# Patient Record
Sex: Female | Born: 1935 | Race: White | Hispanic: No | Smoking: Former smoker
Health system: Southern US, Community
[De-identification: ages and names within clinical notes are randomized; demographics above are authoritative.]

## PROBLEM LIST (undated history)

## (undated) DIAGNOSIS — M47812 Spondylosis without myelopathy or radiculopathy, cervical region: Secondary | ICD-10-CM

## (undated) DIAGNOSIS — I872 Venous insufficiency (chronic) (peripheral): Secondary | ICD-10-CM

## (undated) DIAGNOSIS — M1990 Unspecified osteoarthritis, unspecified site: Secondary | ICD-10-CM

## (undated) DIAGNOSIS — K5790 Diverticulosis of intestine, part unspecified, without perforation or abscess without bleeding: Secondary | ICD-10-CM

## (undated) DIAGNOSIS — J45909 Unspecified asthma, uncomplicated: Secondary | ICD-10-CM

## (undated) DIAGNOSIS — E785 Hyperlipidemia, unspecified: Secondary | ICD-10-CM

## (undated) DIAGNOSIS — R269 Unspecified abnormalities of gait and mobility: Secondary | ICD-10-CM

## (undated) DIAGNOSIS — M109 Gout, unspecified: Secondary | ICD-10-CM

## (undated) DIAGNOSIS — Z8673 Personal history of transient ischemic attack (TIA), and cerebral infarction without residual deficits: Secondary | ICD-10-CM

## (undated) DIAGNOSIS — M11269 Other chondrocalcinosis, unspecified knee: Secondary | ICD-10-CM

## (undated) DIAGNOSIS — R609 Edema, unspecified: Secondary | ICD-10-CM

## (undated) DIAGNOSIS — M810 Age-related osteoporosis without current pathological fracture: Secondary | ICD-10-CM

## (undated) DIAGNOSIS — I10 Essential (primary) hypertension: Secondary | ICD-10-CM

## (undated) DIAGNOSIS — C499 Malignant neoplasm of connective and soft tissue, unspecified: Secondary | ICD-10-CM

## (undated) DIAGNOSIS — E559 Vitamin D deficiency, unspecified: Secondary | ICD-10-CM

## (undated) DIAGNOSIS — K5792 Diverticulitis of intestine, part unspecified, without perforation or abscess without bleeding: Secondary | ICD-10-CM

## (undated) DIAGNOSIS — I071 Rheumatic tricuspid insufficiency: Secondary | ICD-10-CM

## (undated) DIAGNOSIS — N3942 Incontinence without sensory awareness: Secondary | ICD-10-CM

## (undated) DIAGNOSIS — N3281 Overactive bladder: Secondary | ICD-10-CM

## (undated) DIAGNOSIS — I480 Paroxysmal atrial fibrillation: Secondary | ICD-10-CM

## (undated) DIAGNOSIS — I340 Nonrheumatic mitral (valve) insufficiency: Secondary | ICD-10-CM

## (undated) DIAGNOSIS — I4891 Unspecified atrial fibrillation: Secondary | ICD-10-CM

## (undated) HISTORY — DX: Venous insufficiency (chronic) (peripheral): I87.2

## (undated) HISTORY — DX: Spondylosis without myelopathy or radiculopathy, cervical region: M47.812

## (undated) HISTORY — DX: Overactive bladder: N32.81

## (undated) HISTORY — DX: Age-related osteoporosis without current pathological fracture: M81.0

## (undated) HISTORY — DX: Diverticulosis of intestine, part unspecified, without perforation or abscess without bleeding: K57.90

## (undated) HISTORY — DX: Incontinence without sensory awareness: N39.42

## (undated) HISTORY — DX: Vitamin D deficiency, unspecified: E55.9

## (undated) HISTORY — DX: Other chondrocalcinosis, unspecified knee: M11.269

## (undated) HISTORY — DX: Malignant neoplasm of connective and soft tissue, unspecified: C49.9

## (undated) HISTORY — DX: Rheumatic tricuspid insufficiency: I07.1

## (undated) HISTORY — DX: Personal history of transient ischemic attack (TIA), and cerebral infarction without residual deficits: Z86.73

## (undated) HISTORY — DX: Edema, unspecified: R60.9

## (undated) HISTORY — DX: Paroxysmal atrial fibrillation: I48.0

## (undated) HISTORY — DX: Unspecified abnormalities of gait and mobility: R26.9

---
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
## 2009-02-09 HISTORY — PX: COLONOSCOPY: SHX174

## 2011-05-18 DIAGNOSIS — I071 Rheumatic tricuspid insufficiency: Secondary | ICD-10-CM

## 2011-05-18 HISTORY — DX: Rheumatic tricuspid insufficiency: I07.1

## 2012-02-10 HISTORY — PX: THUMB ARTHROSCOPY: SHX2509

## 2012-08-01 ENCOUNTER — Other Ambulatory Visit: Payer: Self-pay

## 2012-08-01 DIAGNOSIS — Z1231 Encounter for screening mammogram for malignant neoplasm of breast: Secondary | ICD-10-CM

## 2012-08-16 ENCOUNTER — Ambulatory Visit: Payer: Self-pay

## 2012-08-19 ENCOUNTER — Ambulatory Visit
Admission: RE | Admit: 2012-08-19 | Discharge: 2012-08-19 | Disposition: A | Discharge status: Home / Self Care | Payer: Medicare Other | Source: Ambulatory Visit

## 2012-08-19 DIAGNOSIS — Z1231 Encounter for screening mammogram for malignant neoplasm of breast: Secondary | ICD-10-CM

## 2012-08-29 ENCOUNTER — Other Ambulatory Visit: Payer: Self-pay | Admitting: Family Medicine

## 2012-08-29 DIAGNOSIS — R928 Other abnormal and inconclusive findings on diagnostic imaging of breast: Secondary | ICD-10-CM

## 2012-09-09 LAB — BASIC METABOLIC PANEL
BUN: 18 mg/dL (ref 4–21)
Creatinine: 0.9 mg/dL (ref 0.5–1.1)
GLUCOSE: 91 mg/dL
POTASSIUM: 4.2 mmol/L (ref 3.4–5.3)
Sodium: 140 mmol/L (ref 137–147)

## 2012-09-09 LAB — HEPATIC FUNCTION PANEL
ALT: 21 U/L (ref 7–35)
AST: 25 U/L (ref 13–35)
Alkaline Phosphatase: 67 U/L (ref 25–125)
Bilirubin, Total: 0.7 mg/dL

## 2012-09-13 ENCOUNTER — Ambulatory Visit
Admission: RE | Admit: 2012-09-13 | Discharge: 2012-09-13 | Disposition: A | Discharge status: Home / Self Care | Payer: Medicare Other | Source: Ambulatory Visit | Attending: Family Medicine | Admitting: Family Medicine

## 2012-09-13 DIAGNOSIS — R928 Other abnormal and inconclusive findings on diagnostic imaging of breast: Secondary | ICD-10-CM

## 2013-02-21 ENCOUNTER — Emergency Department (HOSPITAL_COMMUNITY): Payer: Medicare Other

## 2013-02-21 ENCOUNTER — Encounter (HOSPITAL_COMMUNITY): Payer: Self-pay | Admitting: Emergency Medicine

## 2013-02-21 ENCOUNTER — Emergency Department (HOSPITAL_COMMUNITY)
Admission: EM | Admit: 2013-02-21 | Discharge: 2013-02-21 | Disposition: A | Discharge status: Home / Self Care | Payer: Medicare Other | Attending: Emergency Medicine | Admitting: Emergency Medicine

## 2013-02-21 DIAGNOSIS — I4891 Unspecified atrial fibrillation: Secondary | ICD-10-CM | POA: Insufficient documentation

## 2013-02-21 DIAGNOSIS — S61509A Unspecified open wound of unspecified wrist, initial encounter: Secondary | ICD-10-CM | POA: Insufficient documentation

## 2013-02-21 DIAGNOSIS — W010XXA Fall on same level from slipping, tripping and stumbling without subsequent striking against object, initial encounter: Secondary | ICD-10-CM | POA: Insufficient documentation

## 2013-02-21 DIAGNOSIS — J45909 Unspecified asthma, uncomplicated: Secondary | ICD-10-CM | POA: Insufficient documentation

## 2013-02-21 DIAGNOSIS — IMO0002 Reserved for concepts with insufficient information to code with codable children: Secondary | ICD-10-CM

## 2013-02-21 DIAGNOSIS — W19XXXA Unspecified fall, initial encounter: Secondary | ICD-10-CM

## 2013-02-21 DIAGNOSIS — Z8639 Personal history of other endocrine, nutritional and metabolic disease: Secondary | ICD-10-CM | POA: Insufficient documentation

## 2013-02-21 DIAGNOSIS — Y9301 Activity, walking, marching and hiking: Secondary | ICD-10-CM | POA: Insufficient documentation

## 2013-02-21 DIAGNOSIS — Z79899 Other long term (current) drug therapy: Secondary | ICD-10-CM | POA: Insufficient documentation

## 2013-02-21 DIAGNOSIS — Z7901 Long term (current) use of anticoagulants: Secondary | ICD-10-CM | POA: Insufficient documentation

## 2013-02-21 DIAGNOSIS — Z880 Allergy status to penicillin: Secondary | ICD-10-CM | POA: Insufficient documentation

## 2013-02-21 DIAGNOSIS — Z862 Personal history of diseases of the blood and blood-forming organs and certain disorders involving the immune mechanism: Secondary | ICD-10-CM | POA: Insufficient documentation

## 2013-02-21 DIAGNOSIS — Y9289 Other specified places as the place of occurrence of the external cause: Secondary | ICD-10-CM | POA: Insufficient documentation

## 2013-02-21 DIAGNOSIS — Z8719 Personal history of other diseases of the digestive system: Secondary | ICD-10-CM | POA: Insufficient documentation

## 2013-02-21 DIAGNOSIS — M129 Arthropathy, unspecified: Secondary | ICD-10-CM | POA: Insufficient documentation

## 2013-02-21 DIAGNOSIS — I10 Essential (primary) hypertension: Secondary | ICD-10-CM | POA: Insufficient documentation

## 2013-02-21 HISTORY — DX: Unspecified asthma, uncomplicated: J45.909

## 2013-02-21 HISTORY — DX: Gout, unspecified: M10.9

## 2013-02-21 HISTORY — DX: Hyperlipidemia, unspecified: E78.5

## 2013-02-21 HISTORY — DX: Essential (primary) hypertension: I10

## 2013-02-21 HISTORY — DX: Unspecified atrial fibrillation: I48.91

## 2013-02-21 HISTORY — DX: Nonrheumatic mitral (valve) insufficiency: I34.0

## 2013-02-21 HISTORY — DX: Spondylosis without myelopathy or radiculopathy, cervical region: M47.812

## 2013-02-21 HISTORY — DX: Unspecified osteoarthritis, unspecified site: M19.90

## 2013-02-21 HISTORY — DX: Diverticulitis of intestine, part unspecified, without perforation or abscess without bleeding: K57.92

## 2013-02-21 LAB — PROTIME-INR
INR: 1.94 — ABNORMAL HIGH (ref 0.00–1.49)
Prothrombin Time: 21.6 seconds — ABNORMAL HIGH (ref 11.6–15.2)

## 2013-02-21 MED ORDER — TETANUS-DIPHTH-ACELL PERTUSSIS 5-2.5-18.5 LF-MCG/0.5 IM SUSP
0.5000 mL | Freq: Once | INTRAMUSCULAR | Status: DC
  Filled 2013-02-21: qty 0.5

## 2013-02-21 MED ORDER — ACETAMINOPHEN 325 MG PO TABS
650.0000 mg | ORAL_TABLET | Freq: Once | ORAL | Status: AC
  Administered 2013-02-21: 650 mg via ORAL
  Filled 2013-02-21: qty 2

## 2013-02-21 NOTE — ED Notes (Addendum)
Per EMS patient was walking in garden, tripped and fell, hit her forehead and has laceration with some swelling to face as well as injured left hand, which is painful at 3/10. Full ROM present. Denies head pain, nausea, neck or back pain, denies LOC. Pt on Coumadin for A-fib.

## 2013-02-21 NOTE — ED Provider Notes (Addendum)
CSN: [id number]     Arrival date & time 02/21/13  0957 History   First MD Initiated Contact with Patient 02/21/13 1008     Chief Complaint  Patient presents with  . Fall   (Consider location/radiation/quality/duration/timing/severity/associated sxs/prior Treatment) The history is provided by the patient.  Diana Petersen is a 80 y.o. female hx of afib on coumadin, HTN, HL, diverticulitis here with s/p fall. She had a mechanical fall and tripped over a rug and landed on the left wrist and forehead. Denies any loss of consciousness or syncope. She noticed a bruise on her left wrist and for head laceration. She is on Coumadin for A. Fib and was subtherapeutic as of a week ago. Denies headaches or vomiting or other injuries.     Past Medical History  Diagnosis Date  . Atrial fibrillation   . Hypertension   . Hyperlipemia   . Mitral valve regurgitation   . Asthma   . Gout   . Arthritis   . Spondylosis of cervical spine   . Diverticulitis    History reviewed. No pertinent past surgical history. History reviewed. No pertinent family history. History  Substance Use Topics  . Smoking status: Not on file  . Smokeless tobacco: Not on file  . Alcohol Use: Not on file   OB History   Grav Para Term Preterm Abortions TAB SAB Ect Mult Living                 Review of Systems  Skin: Positive for wound.  All other systems reviewed and are negative.    Allergies  Codeine; Penicillins; and Sulfa antibiotics  Home Medications   Current Outpatient Rx  Name  Route  Sig  Dispense  Refill  . Calcium Carb-Vit D-C-E-Mineral (OS-CAL ULTRA) 600 MG TABS   Oral   Take 1 tablet by mouth daily.         . Cholecalciferol (VITAMIN D-3) 1000 UNITS CAPS   Oral   Take 1 capsule by mouth daily.         Marland Kitchen diltiazem (CARDIZEM CD) 120 MG 24 hr capsule   Oral   Take 120 mg by mouth daily.         . fesoterodine (TOVIAZ) 8 MG TB24 tablet   Oral   Take 8 mg by mouth daily.         Marland Kitchen  GLUCOSAMINE SULFATE PO   Oral   Take 1 tablet by mouth daily.         . hydrochlorothiazide (MICROZIDE) 12.5 MG capsule   Oral   Take 12.5 mg by mouth daily.         Marland Kitchen losartan (COZAAR) 100 MG tablet   Oral   Take 100 mg by mouth daily.         . Multiple Vitamins-Minerals (CENTRUM SILVER ADULT 50+ PO)   Oral   Take 1 tablet by mouth daily.         . Omega-3 Fatty Acids (FISH OIL) 1000 MG CAPS   Oral   Take 1 capsule by mouth 2 (two) times daily.         . Warfarin Sodium (COUMADIN PO)   Oral   Take 2.5-5 mg by mouth See admin instructions. M,W,F take 5mg   on Sun, Tue,Thur, Sat take 1/2 tab  2.5 mg          BP 137/97  Pulse 76  Temp(Src) 97.9 F (36.6 C) (Oral)  Resp 19  SpO2 94% Physical Exam  Nursing note and vitals reviewed. Constitutional: She is oriented to person, place, and time.  Chronically ill, NAD   HENT:  Head: Normocephalic.  Mouth/Throat: Oropharynx is clear and moist.  Forehead laceration above L eyebrow that is well approximated and is about 1 cm in length.   Eyes: Conjunctivae are normal. Pupils are equal, round, and reactive to light.  Neck: Normal range of motion. Neck supple.  Cardiovascular: Normal rate, regular rhythm and normal heart sounds.   Pulmonary/Chest: Effort normal and breath sounds normal. No respiratory distress. She has no wheezes. She has no rales.  Abdominal: Soft. Bowel sounds are normal. She exhibits no distension. There is no tenderness. There is no rebound and no guarding.  Musculoskeletal: Normal range of motion.  L wrist with hematoma and small skin flap   Neurological: She is alert and oriented to person, place, and time.  Skin: Skin is warm and dry.  See above   Psychiatric: She has a normal mood and affect. Her behavior is normal. Judgment and thought content normal.    ED Course  Procedures (including critical care time)  LACERATION REPAIR Performed by: Shirlyn Goltz Authorized by: Shirlyn Goltz Consent:  Verbal consent obtained. Risks and benefits: risks, benefits and alternatives were discussed Consent given by: patient Patient identity confirmed: provided demographic data Prepped and Draped in normal sterile fashion Wound explored  Laceration Location: forehead  Laceration Length: 1 cm  No Foreign Bodies seen or palpated  Anesthesia: local infiltration  Local anesthetic: none   Irrigation method: syringe Amount of cleaning: standard  Skin closure: dermabond   Patient tolerance: Patient tolerated the procedure well with no immediate complications.  LACERATION REPAIR Performed by: Shirlyn Goltz Authorized by: Shirlyn Goltz Consent: Verbal consent obtained. Risks and benefits: risks, benefits and alternatives were discussed Consent given by: patient Patient identity confirmed: provided demographic data Prepped and Draped in normal sterile fashion Wound explored  Laceration Location: L wrist  Laceration Length: 2 cm  No Foreign Bodies seen or palpated  Anesthesia: local infiltration  Local anesthetic:none   Irrigation method: syringe Amount of cleaning: standard  Skin closure: dermabond Technique: dermabond  Patient tolerance: Patient tolerated the procedure well with no immediate complications.    Labs Review Labs Reviewed  PROTIME-INR - Abnormal; Notable for the following:    Prothrombin Time 21.6 (*)    INR 1.94 (*)    All other components within normal limits   Imaging Review Dg Wrist Complete Left  02/21/2013   CLINICAL DATA:  Pain status post fall, abrasions of the palmar surface of the thumb  EXAM: LEFT WRIST - COMPLETE 3+ VIEW  COMPARISON:  None.  FINDINGS: Four views of the left wrist reveal the bones to be adequately mineralized. There is no evidence of an acute fracture nor dislocation. There are mild degenerative changes of the 1st carpometacarpal joint. The phi carpal bones appear intact. The radiocarpal joint is normal in appearance. The soft  tissues exhibit no foreign bodies.  IMPRESSION: There is no evidence of an acute fracture nor dislocation of the bones of the left wrist. There are mild degenerative changes of the 1st carpometacarpal joint.   Electronically Signed   By: Kel Senn  [place]   On: 02/21/2013 10:50   Ct Head Wo Contrast  02/21/2013   CLINICAL DATA:  Fall.  On Coumadin.  EXAM: CT HEAD WITHOUT CONTRAST  TECHNIQUE: Contiguous axial images were obtained from the base of the skull through the vertex without intravenous contrast.  COMPARISON:  None.  FINDINGS: Subcutaneous hematoma left frontal/ supraorbital/ pre orbital region without underlying fracture or intracranial hemorrhage.  Small vessel disease type changes without CT evidence of large acute infarct.  Global atrophy without hydrocephalus.  Orbital structures appear to be grossly intact.  No intracranial mass lesion noted on this unenhanced exam.  Vascular calcifications.  IMPRESSION: Subcutaneous hematoma left frontal/ supraorbital/ pre orbital region without underlying fracture or intracranial hemorrhage.  Small vessel disease type changes without CT evidence of large acute infarct.  Global atrophy without hydrocephalus.   Electronically Signed   By: Chauncey Cruel M.D.   On: 02/21/2013 10:38    EKG Interpretation   None       MDM  No diagnosis found. Falynn Ailey is a 80 y.o. female here with s/p fall. Mechanical fall so syncope workup not necessary. Will get CT head given on coumadin. Will also get INR and L wrist xray. Patient called assisted living, tetanus was given last year.   11:12 AM CT head and xray nl. INR 1.9. Laceration dermabonded. Stable for d/c.     Wandra Arthurs, MD 02/21/13 [place] Jessica Seidman, MD 02/21/13 1124

## 2013-02-21 NOTE — Discharge Instructions (Signed)
Take tylenol for pain.   Keep wound clean and dry.   The glue will fall off on its own.   Follow up with your doctor.   Return to ER if you have fever, worse swelling, headache, vomiting, wound infection.

## 2013-02-21 NOTE — ED Notes (Signed)
MD at bedside. 

## 2013-02-21 NOTE — ED Notes (Signed)
Bed: TU88 Expected date: 02/21/13 Expected time: 9:24 AM Means of arrival:  Comments: Low hgb

## 2013-06-22 LAB — HM DEXA SCAN: HM Dexa Scan: NORMAL

## 2013-07-27 ENCOUNTER — Encounter: Payer: Self-pay | Admitting: Nurse Practitioner

## 2013-07-27 ENCOUNTER — Non-Acute Institutional Stay: Payer: Medicare Other | Admitting: Nurse Practitioner

## 2013-07-27 VITALS — BP 132/62 | HR 68 | Temp 97.7°F | Wt 169.0 lb

## 2013-07-27 DIAGNOSIS — Z7901 Long term (current) use of anticoagulants: Secondary | ICD-10-CM

## 2013-07-27 DIAGNOSIS — Z5181 Encounter for therapeutic drug level monitoring: Secondary | ICD-10-CM

## 2013-07-27 DIAGNOSIS — I4891 Unspecified atrial fibrillation: Secondary | ICD-10-CM

## 2013-07-27 DIAGNOSIS — E785 Hyperlipidemia, unspecified: Secondary | ICD-10-CM

## 2013-07-27 DIAGNOSIS — I482 Chronic atrial fibrillation, unspecified: Secondary | ICD-10-CM

## 2013-07-27 DIAGNOSIS — E559 Vitamin D deficiency, unspecified: Secondary | ICD-10-CM

## 2013-07-27 DIAGNOSIS — I10 Essential (primary) hypertension: Secondary | ICD-10-CM

## 2013-07-27 DIAGNOSIS — N3942 Incontinence without sensory awareness: Secondary | ICD-10-CM

## 2013-07-28 DIAGNOSIS — M11269 Other chondrocalcinosis, unspecified knee: Secondary | ICD-10-CM

## 2013-07-28 DIAGNOSIS — N3942 Incontinence without sensory awareness: Secondary | ICD-10-CM | POA: Insufficient documentation

## 2013-07-28 DIAGNOSIS — E559 Vitamin D deficiency, unspecified: Secondary | ICD-10-CM | POA: Insufficient documentation

## 2013-07-28 DIAGNOSIS — I480 Paroxysmal atrial fibrillation: Secondary | ICD-10-CM

## 2013-07-28 DIAGNOSIS — E785 Hyperlipidemia, unspecified: Secondary | ICD-10-CM | POA: Insufficient documentation

## 2013-07-28 DIAGNOSIS — I10 Essential (primary) hypertension: Secondary | ICD-10-CM | POA: Insufficient documentation

## 2013-07-28 DIAGNOSIS — Z9229 Personal history of other drug therapy: Secondary | ICD-10-CM | POA: Insufficient documentation

## 2013-07-28 HISTORY — DX: Incontinence without sensory awareness: N39.42

## 2013-07-28 HISTORY — DX: Other chondrocalcinosis, unspecified knee: M11.269

## 2013-07-28 HISTORY — DX: Paroxysmal atrial fibrillation: I48.0

## 2013-07-28 HISTORY — DX: Vitamin D deficiency, unspecified: E55.9

## 2013-07-28 NOTE — Assessment & Plan Note (Signed)
Serum Ca 10.3 PTH 26(15-65)

## 2013-07-28 NOTE — Assessment & Plan Note (Signed)
Takes Vit D3 daily.

## 2013-07-28 NOTE — Assessment & Plan Note (Addendum)
Per progress noted 05/24/13 Dr. Wendy McNeill: since her diagnosis of "gout" was made based solely on X-ray. Uric acid 5.5.  No gouty attacks. Continued with HCTZ  

## 2013-07-28 NOTE — Assessment & Plan Note (Signed)
Rate controlled, takes Diltiazem SR 24hr 120mg  daily. Coumadin for thromboembolic risk reduction.

## 2013-07-28 NOTE — Assessment & Plan Note (Signed)
For Afib

## 2013-07-28 NOTE — Assessment & Plan Note (Signed)
Blood pressure is controlled. Takes Diltiazem SR 120mg, HCTZ 12.5mg, and Losartan 100mg daily.   

## 2013-07-28 NOTE — Assessment & Plan Note (Signed)
Managed with Toviaz 8mg  daily.

## 2013-07-28 NOTE — Assessment & Plan Note (Signed)
Takes Omega 3 fatty acids.

## 2013-07-28 NOTE — Progress Notes (Signed)
Patient ID: Diana Petersen, female   DOB: 10-06-1929, 80 y.o.   MRN: [id number]   Code Status: Full code.   Allergies  Allergen Reactions  . Codeine   . Penicillins   . Sulfa Antibiotics     Chief Complaint  Patient presents with  . Medical Management of Chronic Issues    New Patient , switching doctors    HPI: Patient is a 80 y.o. female seen in the clinic at [facility] today for new patient establishment and other chronic medical conditions.  Problem List Items Addressed This Visit   Unspecified vitamin D deficiency     Takes Vit D3 daily.     Other and unspecified hyperlipidemia     Takes Omega 3 fatty acids.     Incontinence without sensory awareness     Managed with Toviaz 8mg  daily.     Hypercalcemia     Serum Ca 10.3 PTH 26(15-65)    HTN (hypertension) - Primary     Blood pressure is controlled. Takes Diltiazem SR 120mg , HCTZ 12.5mg , and Losartan 100mg  daily.     Chronic a-fib     Rate controlled, takes Diltiazem SR 24hr 120mg  daily. Coumadin for thromboembolic risk reduction.     Anticoagulation goal of INR 2 to 3     For A-fib.        Review of Systems:  Review of Systems  Constitutional: Negative for fever, chills, weight loss, malaise/fatigue and diaphoresis.  HENT: Negative for congestion, ear discharge, ear pain, hearing loss, nosebleeds, sore throat and tinnitus.   Eyes: Negative for blurred vision, double vision, photophobia, pain, discharge and redness.  Respiratory: Negative for cough, hemoptysis, sputum production, shortness of breath, wheezing and stridor.   Cardiovascular: Negative for chest pain, palpitations, orthopnea, claudication, leg swelling and PND.  Gastrointestinal: Negative for heartburn, nausea, vomiting, abdominal pain, diarrhea, constipation, blood in stool and melena.  Genitourinary: Negative for dysuria, urgency, frequency, hematuria and flank pain.  Musculoskeletal: Negative for back pain, falls, joint pain, myalgias  and neck pain.  Skin: Negative for itching and rash.  Neurological: Negative for dizziness, tingling, tremors, sensory change, speech change, focal weakness, seizures, loss of consciousness, weakness and headaches.  Endo/Heme/Allergies: Negative for environmental allergies and polydipsia. Bruises/bleeds easily.  Psychiatric/Behavioral: Negative for depression, suicidal ideas, hallucinations, memory loss and substance abuse. The patient is not nervous/anxious and does not have insomnia.      Past Medical History  Diagnosis Date  . Atrial fibrillation   . Hypertension   . Hyperlipemia   . Mitral valve regurgitation   . Asthma   . Gout   . Arthritis   . Spondylosis of cervical spine   . Diverticulitis    Past Surgical History  Procedure Laterality Date  . Tonsillectomy  1938  . Thumb arthroscopy Right 2014    Dr. Burney Gauze  . Colonoscopy  2011    Dr. Cindie Laroche Jule Ser)   Social History:   reports that she quit smoking about 34 years ago. She has never used smokeless tobacco. She reports that she drinks about .6 ounces of alcohol per week. She reports that she does not use illicit drugs.  Family History  Problem Relation Age of Onset  . Alzheimer's disease Mother   . Heart disease Father   . Heart disease Brother     Medications: Patient's Medications  New Prescriptions   No medications on file  Previous Medications   CHOLECALCIFEROL (VITAMIN D-3) 1000 UNITS CAPS    Take 1 capsule  by mouth daily.   DILTIAZEM (CARDIZEM CD) 120 MG 24 HR CAPSULE    Take 120 mg by mouth daily.   FESOTERODINE (TOVIAZ) 8 MG TB24 TABLET    Take 8 mg by mouth daily.   HYDROCHLOROTHIAZIDE (MICROZIDE) 12.5 MG CAPSULE    Take 12.5 mg by mouth daily. Each morning   LOSARTAN (COZAAR) 100 MG TABLET    Take 100 mg by mouth daily.   MISC NATURAL PRODUCTS (TART CHERRY ADVANCED PO)    Take by mouth. Take 2 at bedtime for gout   MULTIPLE VITAMINS-MINERALS (CENTRUM SILVER ADULT 50+ PO)    Take 1  tablet by mouth daily.   NON FORMULARY    Tumeric 300mg  one daily for gout   OMEGA-3 FATTY ACIDS (FISH OIL) 1000 MG CAPS    Take 1 capsule by mouth 2 (two) times daily.   WARFARIN SODIUM (COUMADIN PO)    Take 5 mg by mouth See admin instructions. Take one tablet on M-W-F-Sunt; take 1/2 tablet Tue, Thur, Sat  Modified Medications   No medications on file  Discontinued Medications   CALCIUM CARB-VIT D-C-E-MINERAL (OS-CAL ULTRA) 600 MG TABS    Take 1 tablet by mouth daily.   GLUCOSAMINE SULFATE PO    Take 1 tablet by mouth daily.     Physical Exam: Physical Exam  Constitutional: She is oriented to person, place, and time. She appears well-developed and well-nourished. No distress.  HENT:  Head: Normocephalic and atraumatic.  Right Ear: External ear normal.  Left Ear: External ear normal.  Nose: Nose normal.  Mouth/Throat: Oropharynx is clear and moist. No oropharyngeal exudate.  Eyes: Conjunctivae and EOM are normal. Pupils are equal, round, and reactive to light. Right eye exhibits no discharge. Left eye exhibits no discharge. No scleral icterus.  Neck: Normal range of motion. Neck supple. No JVD present. No tracheal deviation present. No thyromegaly present.  Cardiovascular: Normal rate, normal heart sounds and intact distal pulses.  An irregular rhythm present.  No murmur heard. Pulmonary/Chest: Effort normal and breath sounds normal. No stridor. No respiratory distress. She has no wheezes. She has no rales. She exhibits no tenderness.  Abdominal: Soft. Bowel sounds are normal. She exhibits no distension and no mass. There is no tenderness. There is no rebound and no guarding.  Genitourinary: Guaiac negative stool.  Musculoskeletal: Normal range of motion. She exhibits no edema and no tenderness.  Lymphadenopathy:    She has no cervical adenopathy.  Neurological: She is alert and oriented to person, place, and time. She has normal reflexes. She displays normal reflexes. No cranial  nerve deficit. She exhibits normal muscle tone. Coordination normal.  Skin: Skin is warm and dry. No rash noted. She is not diaphoretic. No erythema. No pallor.  Psychiatric: She has a normal mood and affect. Her behavior is normal. Judgment and thought content normal.    Filed Vitals:   07/27/13 1535  BP: 132/62  Pulse: 68  Temp: 97.7 F (36.5 C)  Weight: 169 lb (76.658 kg)      Labs reviewed: Basic Metabolic Panel:  Recent Labs  09/09/12  NA 140  K 4.2  BUN 18  CREATININE 0.9   Liver Function Tests:  Recent Labs  09/09/12  AST 25  ALT 21  ALKPHOS 67   No results found for this basename: LIPASE, AMYLASE,  in the last 8760 hours No results found for this basename: AMMONIA,  in the last 8760 hours CBC: No results found for this basename: WBC,  NEUTROABS, HGB, HCT, MCV, PLT,  in the last 8760 hours Lipid Panel: No results found for this basename: CHOL, HDL, LDLCALC, TRIG, CHOLHDL, LDLDIRECT,  in the last 8760 hours Anemia Panel: No results found for this basename: FOLATE, IRON, VITAMINB12,  in the last 8760 hours  Past Procedures:  09/13/12 mammogram:   IMPRESSION:  Bilateral benign breast calcifications, compatible with small  degenerated fibroadenomas. No evidence of malignancy.   02/21/13 X-ray left wrist:  IMPRESSION:  There is no evidence of an acute fracture nor dislocation of the  bones of the left wrist. There are mild degenerative changes of the  1st carpometacarpal joint.  02/21/13 CT head w/o CM:  IMPRESSION:  Subcutaneous hematoma left frontal/ supraorbital/ pre orbital region  without underlying fracture or intracranial hemorrhage.  Small vessel disease type changes without CT evidence of large acute  infarct.  Global atrophy without hydrocephalus.   06/16/13 CXR  No active disease.    Assessment/Plan HTN (hypertension) Blood pressure is controlled. Takes Diltiazem SR 120mg , HCTZ 12.5mg , and Losartan 100mg  daily.   Chronic a-fib Rate  controlled, takes Diltiazem SR 24hr 120mg  daily. Coumadin for thromboembolic risk reduction.   Anticoagulation goal of INR 2 to 3 For A-fib.   Incontinence without sensory awareness Managed with Toviaz 8mg  daily.   Gout Per progress noted 05/24/13 Dr. Cari Caraway: since her diagnosis of "gout" was made based solely on X-ray. Uric acid 5.5.  No gouty attacks. Continued with HCTZ  Unspecified vitamin D deficiency Takes Vit D3 daily.   Other and unspecified hyperlipidemia Takes Omega 3 fatty acids.   Hypercalcemia Serum Ca 10.3 PTH 26(15-65)    Family/ Staff Communication: observe the patient.   Goals of Care: [place]  Labs/tests ordered: update Labs prior to next appointment(CBC, CMP, TSH, Lipid panel)      2

## 2013-08-06 ENCOUNTER — Emergency Department (HOSPITAL_COMMUNITY): Payer: Medicare Other

## 2013-08-06 ENCOUNTER — Encounter (HOSPITAL_COMMUNITY): Payer: Self-pay | Admitting: Emergency Medicine

## 2013-08-06 ENCOUNTER — Emergency Department (HOSPITAL_COMMUNITY)
Admission: EM | Admit: 2013-08-06 | Discharge: 2013-08-06 | Disposition: A | Discharge status: Home / Self Care | Payer: Medicare Other | Attending: Emergency Medicine | Admitting: Emergency Medicine

## 2013-08-06 DIAGNOSIS — W010XXA Fall on same level from slipping, tripping and stumbling without subsequent striking against object, initial encounter: Secondary | ICD-10-CM | POA: Insufficient documentation

## 2013-08-06 DIAGNOSIS — I10 Essential (primary) hypertension: Secondary | ICD-10-CM | POA: Insufficient documentation

## 2013-08-06 DIAGNOSIS — IMO0002 Reserved for concepts with insufficient information to code with codable children: Secondary | ICD-10-CM | POA: Insufficient documentation

## 2013-08-06 DIAGNOSIS — Z880 Allergy status to penicillin: Secondary | ICD-10-CM | POA: Insufficient documentation

## 2013-08-06 DIAGNOSIS — Z79899 Other long term (current) drug therapy: Secondary | ICD-10-CM | POA: Insufficient documentation

## 2013-08-06 DIAGNOSIS — S0990XA Unspecified injury of head, initial encounter: Secondary | ICD-10-CM | POA: Insufficient documentation

## 2013-08-06 DIAGNOSIS — S52599A Other fractures of lower end of unspecified radius, initial encounter for closed fracture: Secondary | ICD-10-CM | POA: Insufficient documentation

## 2013-08-06 DIAGNOSIS — J45909 Unspecified asthma, uncomplicated: Secondary | ICD-10-CM | POA: Insufficient documentation

## 2013-08-06 DIAGNOSIS — M129 Arthropathy, unspecified: Secondary | ICD-10-CM | POA: Insufficient documentation

## 2013-08-06 DIAGNOSIS — I4891 Unspecified atrial fibrillation: Secondary | ICD-10-CM | POA: Insufficient documentation

## 2013-08-06 DIAGNOSIS — Z87891 Personal history of nicotine dependence: Secondary | ICD-10-CM | POA: Insufficient documentation

## 2013-08-06 DIAGNOSIS — Z8719 Personal history of other diseases of the digestive system: Secondary | ICD-10-CM | POA: Insufficient documentation

## 2013-08-06 DIAGNOSIS — S52502A Unspecified fracture of the lower end of left radius, initial encounter for closed fracture: Secondary | ICD-10-CM

## 2013-08-06 DIAGNOSIS — Y9289 Other specified places as the place of occurrence of the external cause: Secondary | ICD-10-CM | POA: Insufficient documentation

## 2013-08-06 DIAGNOSIS — Y9302 Activity, running: Secondary | ICD-10-CM | POA: Insufficient documentation

## 2013-08-06 DIAGNOSIS — W1809XA Striking against other object with subsequent fall, initial encounter: Secondary | ICD-10-CM | POA: Insufficient documentation

## 2013-08-06 DIAGNOSIS — Z7901 Long term (current) use of anticoagulants: Secondary | ICD-10-CM | POA: Insufficient documentation

## 2013-08-06 DIAGNOSIS — E785 Hyperlipidemia, unspecified: Secondary | ICD-10-CM | POA: Insufficient documentation

## 2013-08-06 DIAGNOSIS — Z23 Encounter for immunization: Secondary | ICD-10-CM | POA: Insufficient documentation

## 2013-08-06 LAB — PROTIME-INR
INR: 2.23 — AB (ref 0.00–1.49)
Prothrombin Time: 24.7 seconds — ABNORMAL HIGH (ref 11.6–15.2)

## 2013-08-06 LAB — CBC WITH DIFFERENTIAL/PLATELET
Basophils Absolute: 0 10*3/uL (ref 0.0–0.1)
Basophils Relative: 0 % (ref 0–1)
Eosinophils Absolute: 0.1 10*3/uL (ref 0.0–0.7)
Eosinophils Relative: 1 % (ref 0–5)
HCT: 40.2 % (ref 36.0–46.0)
HEMOGLOBIN: 13.6 g/dL (ref 12.0–15.0)
LYMPHS ABS: 1.2 10*3/uL (ref 0.7–4.0)
LYMPHS PCT: 20 % (ref 12–46)
MCH: 31.6 pg (ref 26.0–34.0)
MCHC: 33.8 g/dL (ref 30.0–36.0)
MCV: 93.5 fL (ref 78.0–100.0)
MONOS PCT: 6 % (ref 3–12)
Monocytes Absolute: 0.4 10*3/uL (ref 0.1–1.0)
NEUTROS ABS: 4.6 10*3/uL (ref 1.7–7.7)
NEUTROS PCT: 73 % (ref 43–77)
Platelets: 221 10*3/uL (ref 150–400)
RBC: 4.3 MIL/uL (ref 3.87–5.11)
RDW: 13.8 % (ref 11.5–15.5)
WBC: 6.3 10*3/uL (ref 4.0–10.5)

## 2013-08-06 MED ORDER — HYDROCODONE-ACETAMINOPHEN 5-325 MG PO TABS: 1.0000 | ORAL_TABLET | ORAL | Status: DC | PRN

## 2013-08-06 MED ORDER — HYDROCODONE-ACETAMINOPHEN 5-325 MG PO TABS
1.0000 | ORAL_TABLET | Freq: Once | ORAL | Status: AC
  Administered 2013-08-06: 1 via ORAL
  Filled 2013-08-06: qty 1

## 2013-08-06 MED ORDER — TETANUS-DIPHTH-ACELL PERTUSSIS 5-2.5-18.5 LF-MCG/0.5 IM SUSP
0.5000 mL | Freq: Once | INTRAMUSCULAR | Status: AC
  Administered 2013-08-06: 0.5 mL via INTRAMUSCULAR
  Filled 2013-08-06: qty 0.5

## 2013-08-06 NOTE — ED Provider Notes (Signed)
CSN: [id number]     Arrival date & time 08/06/13  1201 History   First MD Initiated Contact with Patient 08/06/13 1238     Chief Complaint  Patient presents with  . Fall    HPI Patient presents to the emergency room after stumbling and falling today. She was running down the sidewalk when she tripped and fell landing on her left wrist and hitting her head. Patient does not have a headache. She did not have loss of consciousness. She had some mild scrapes in the left temporal area that was treated with a bandage. Patient states she's having significant pain in her left wrist. She has noticed the swelling. She denies any neck pain or back pain she denies any elbow pain. No chest pain abdominal pain or shortness of breath.  She does take Coumadin for atrial fibrillation Past Medical History  Diagnosis Date  . Atrial fibrillation   . Hypertension   . Hyperlipemia   . Mitral valve regurgitation   . Asthma   . Gout   . Arthritis   . Spondylosis of cervical spine   . Diverticulitis    Past Surgical History  Procedure Laterality Date  . Tonsillectomy  1938  . Thumb arthroscopy Right 2014    Dr. Burney Gauze  . Colonoscopy  2011    Dr. Cindie Laroche Jule Ser)   Family History  Problem Relation Age of Onset  . Alzheimer's disease Mother   . Heart disease Father   . Heart disease Brother    History  Substance Use Topics  . Smoking status: Former Smoker    Quit date: 07/28/1979  . Smokeless tobacco: Never Used  . Alcohol Use: 0.6 oz/week    1 Glasses of wine per week     Comment: glass of wine couple nights a week   OB History   Grav Para Term Preterm Abortions TAB SAB Ect Mult Living                 Review of Systems  All other systems reviewed and are negative.     Allergies  Codeine; Penicillins; and Sulfa antibiotics  Home Medications   Prior to Admission medications   Medication Sig Start Date End Date Taking? Authorizing Provider  albuterol (PROVENTIL  HFA;VENTOLIN HFA) 108 (90 BASE) MCG/ACT inhaler Inhale 2 puffs into the lungs every 6 (six) hours as needed for wheezing or shortness of breath.   Yes Historical Provider, MD  CHERRY CONCENTRATE PO Take 2 capsules by mouth every evening. "Dark Cherry"   Yes Historical Provider, MD  cholecalciferol (VITAMIN D) 1000 UNITS tablet Take 1,000 Units by mouth every morning.   Yes Historical Provider, MD  diltiazem (CARDIZEM CD) 120 MG 24 hr capsule Take 120 mg by mouth every morning.    Yes Historical Provider, MD  fesoterodine (TOVIAZ) 8 MG TB24 tablet Take 8 mg by mouth every evening.    Yes Historical Provider, MD  hydrochlorothiazide (MICROZIDE) 12.5 MG capsule Take 12.5 mg by mouth every morning.    Yes Historical Provider, MD  losartan (COZAAR) 100 MG tablet Take 100 mg by mouth every morning.    Yes Historical Provider, MD  Multiple Vitamins-Minerals (CENTRUM SILVER ADULT 50+ PO) Take 1 tablet by mouth every morning.    Yes Historical Provider, MD  Omega-3 Fatty Acids (FISH OIL) 1000 MG CAPS Take 2 capsules by mouth every morning.    Yes Historical Provider, MD  TURMERIC PO Take 300 mg by mouth every morning.   Yes  Historical Provider, MD  warfarin (COUMADIN) 5 MG tablet Take 2.5-5 mg by mouth every evening. Take 1 tab (5mg ) on Sunday, Monday, and Friday. Take 0.5 tab (2.5mg ) on Tuesday, Wednesday, Thursday, and Saturday.   Yes Historical Provider, MD  HYDROcodone-acetaminophen (NORCO/VICODIN) 5-325 MG per tablet Take 1-2 tablets by mouth every 4 (four) hours as needed. 08/06/13   Dorie Rank, MD   BP 147/77  Pulse 74  Temp(Src) 97.9 F (36.6 C) (Oral)  Resp 20  Ht 5\' 2"  (1.575 m)  Wt 170 lb (77.111 kg)  BMI 31.09 kg/m2  SpO2 97% Physical Exam  Nursing note and vitals reviewed. Constitutional: She appears well-developed and well-nourished. No distress.  HENT:  Head: Normocephalic and atraumatic.  Right Ear: External ear normal.  Left Ear: External ear normal.  2 small superficial abrasions  in the left temporal region, no large hematoma  Eyes: Conjunctivae are normal. Right eye exhibits no discharge. Left eye exhibits no discharge. No scleral icterus.  Neck: Neck supple. No tracheal deviation present.  Cardiovascular: Normal rate, regular rhythm and intact distal pulses.   Pulmonary/Chest: Effort normal and breath sounds normal. No stridor. No respiratory distress. She has no wheezes. She has no rales.  Abdominal: Soft. Bowel sounds are normal. She exhibits no distension. There is no tenderness. There is no rebound and no guarding.  Musculoskeletal: She exhibits no edema.       Left wrist: She exhibits tenderness, bony tenderness and swelling. She exhibits no effusion.       Cervical back: Normal.  Neurological: She is alert. She has normal strength. No cranial nerve deficit (no facial droop, extraocular movements intact, no slurred speech) or sensory deficit. She exhibits normal muscle tone. She displays no seizure activity. Coordination normal.  Skin: Skin is warm and dry. No rash noted.  Psychiatric: She has a normal mood and affect.    ED Course  Procedures (including critical care time) Labs Review Labs Reviewed  PROTIME-INR - Abnormal; Notable for the following:    Prothrombin Time 24.7 (*)    INR 2.23 (*)    All other components within normal limits  CBC WITH DIFFERENTIAL    Imaging Review Dg Wrist Complete Left  08/06/2013   CLINICAL DATA:  FALL  EXAM: LEFT WRIST - COMPLETE 3+ VIEW  COMPARISON:  02/21/2013  FINDINGS: Mildly impacted transverse fracture across the distal radial metaphysis. No definite extension to the distal radial articular surface or radiocarpal joint. Neutral angulation of the distal radial articular surface. Carpal rows intact. Distal ulna intact.  IMPRESSION: 1. Impacted distal radial transverse fracture without significant angulation.   Electronically Signed   By: Arne Cleveland M.D.   On: 08/06/2013 13:52   Ct Head Wo Contrast  08/06/2013    CLINICAL DATA:  80 year old female with fall and laceration. Initial encounter.  EXAM: CT HEAD WITHOUT CONTRAST  TECHNIQUE: Contiguous axial images were obtained from the base of the skull through the vertex without intravenous contrast.  COMPARISON:  Head CT without contrast 02/21/2013.  FINDINGS: Visualized paranasal sinuses and mastoids are clear. Mild left supraorbital anterior scalp contusion on series 3, image 24. Additional left supraorbital scalp soft tissue swelling, but some of this may be the residua of the soft tissue hematoma seen in this region in January. Calvarium intact. Stable visualized intraorbital soft tissues with postoperative changes to the globes.  Mild Calcified atherosclerosis at the skull base. Cerebral volume is stable and within normal limits for age. No ventriculomegaly. No midline shift, mass  effect, or evidence of intracranial mass lesion. Patchy white matter hypodensity is stable. No evidence of cortically based acute infarction identified. No acute intracranial hemorrhage identified. No suspicious intracranial vascular hyperdensity.  IMPRESSION: 1. Scalp soft tissue injury without underlying fracture. 2. No acute intracranial abnormality. Stable moderate for age nonspecific white matter hypodensity.   Electronically Signed   By: Lars Pinks M.D.   On: 08/06/2013 13:21     MDM   Final diagnoses:  Distal radius fracture, left, closed, initial encounter    No serious head injury noted associated with the patient's fall. She does have an impacted distal radius fracture.  Patient we placed in a splint. She has seen Dr. Burney Gauze in the past. She will call his office to schedule an appointment. I will give her prescription for hydrocodone for pain. She tolerated that in the emergency department   Dorie Rank, MD 08/06/13 1431

## 2013-08-06 NOTE — Discharge Instructions (Signed)
Radial Fracture You have a broken bone (fracture) of the forearm. This is the part of your arm between the elbow and your wrist. Your forearm is made up of two bones. These are the radius and ulna. Your fracture is in the radial shaft. This is the bone in your forearm located on the thumb side. A cast or splint is used to protect and keep your injured bone from moving. The cast or splint will be on generally for about 5 to 6 weeks, with individual variations. HOME CARE INSTRUCTIONS   Keep the injured part elevated while sitting or lying down. Keep the injury above the level of your heart (the center of the chest). This will decrease swelling and pain.  Apply ice to the injury for 15-20 minutes, 03-04 times per day while awake, for 2 days. Put the ice in a plastic bag and place a towel between the bag of ice and your cast or splint.  Move your fingers to avoid stiffness and minimize swelling.  If you have a plaster or fiberglass cast:  Do not try to scratch the skin under the cast using sharp or pointed objects.  Check the skin around the cast every day. You may put lotion on any red or sore areas.  Keep your cast dry and clean.  If you have a plaster splint:  Wear the splint as directed.  You may loosen the elastic around the splint if your fingers become numb, tingle, or turn cold or blue.  Do not put pressure on any part of your cast or splint. It may break. Rest your cast only on a pillow for the first 24 hours until it is fully hardened.  Your cast or splint can be protected during bathing with a plastic bag. Do not lower the cast or splint into water.  Only take over-the-counter or prescription medicines for pain, discomfort, or fever as directed by your caregiver. SEEK IMMEDIATE MEDICAL CARE IF:   Your cast gets damaged or breaks.  You have more severe pain or swelling than you did before getting the cast.  You have severe pain when stretching your fingers.  There is a bad  smell, new stains and/or pus-like (purulent) drainage coming from under the cast.  Your fingers or hand turn pale or blue and become cold or your loose feeling. Document Released: 07/09/2005 Document Revised: 04/20/2011 Document Reviewed: 10/05/2005 [facility] Patient Information 2015 [place], [place]. This information is not intended to replace advice given to you by your health care provider. Make sure you discuss any questions you have with your health care provider.

## 2013-08-06 NOTE — ED Notes (Signed)
Patient is alert and oriented x3.  She is complaining of a head laceration after a fall.  Patient states she was running down the side walk and got her shoe stuck.  Patient  [address] and hit her head but denies any LOC.  Currently she rates her pain 9 of 10.

## 2013-08-07 ENCOUNTER — Telehealth: Payer: Self-pay | Admitting: *Deleted

## 2013-08-07 NOTE — Telephone Encounter (Signed)
Protime Results faxed from [facility]  INR: 2.3  Current Dose of Coumadin: 2.5mg  alternating with 5mg  Per Dr. Reed---Continue 2.5mg  alternating with 5mg  and recheck in 2 weeks. Patient Notified and agreed.

## 2013-08-10 ENCOUNTER — Other Ambulatory Visit: Payer: Self-pay | Admitting: Internal Medicine

## 2013-08-21 ENCOUNTER — Telehealth: Payer: Self-pay | Admitting: *Deleted

## 2013-08-21 NOTE — Telephone Encounter (Signed)
Protime Results from [facility]  08/21/2013  INR:  2.2  Current Dose of Coumadin is 2.5mg  alternating with 5mg  Per Dr. Reed--Continue same dose of Coumadin and recheck in 1 month. Patient Notified and faxed back to [facility] Fax# [phone]

## 2013-09-11 ENCOUNTER — Other Ambulatory Visit: Payer: Self-pay | Admitting: *Deleted

## 2013-09-11 MED ORDER — FESOTERODINE FUMARATE ER 8 MG PO TB24: 8.0000 mg | ORAL_TABLET | Freq: Every evening | ORAL | Status: DC

## 2013-09-11 NOTE — Telephone Encounter (Signed)
[facility]  

## 2013-09-15 ENCOUNTER — Other Ambulatory Visit: Payer: Self-pay | Admitting: Internal Medicine

## 2013-09-15 ENCOUNTER — Other Ambulatory Visit: Payer: Self-pay | Admitting: *Deleted

## 2013-09-15 MED ORDER — WARFARIN SODIUM 5 MG PO TABS: ORAL_TABLET | ORAL | Status: DC

## 2013-09-16 ENCOUNTER — Other Ambulatory Visit: Payer: Self-pay | Admitting: Internal Medicine

## 2013-09-18 ENCOUNTER — Telehealth: Payer: Self-pay | Admitting: *Deleted

## 2013-09-18 MED ORDER — WARFARIN SODIUM 5 MG PO TABS: ORAL_TABLET | ORAL | Status: DC

## 2013-09-18 NOTE — Telephone Encounter (Signed)
Protime Results from [facility] 09/18/2013  INR:  3.2  Current Dose of Coumadin is 2.5mg  alternating with 5mg  daily Per Dr. Reed--Continue current dose of Coumadin and recheck in 2 weeks. Patient Notified and faxed back to facility. Faxed Rx into [employer] [facility] [employer]

## 2013-09-23 LAB — PROTIME-INR

## 2013-10-19 LAB — BASIC METABOLIC PANEL
BUN: 19 mg/dL (ref 4–21)
Creatinine: 0.8 mg/dL (ref 0.5–1.1)
GLUCOSE: 85 mg/dL
POTASSIUM: 4.1 mmol/L (ref 3.4–5.3)
Sodium: 141 mmol/L (ref 137–147)

## 2013-10-19 LAB — CBC AND DIFFERENTIAL
HEMATOCRIT: 41 % (ref 36–46)
Hemoglobin: 14.3 g/dL (ref 12.0–16.0)
PLATELETS: 247 10*3/uL (ref 150–399)
WBC: 4.7 10^3/mL

## 2013-10-19 LAB — TSH: TSH: 1.7 u[IU]/mL (ref 0.41–5.90)

## 2013-10-19 LAB — HEPATIC FUNCTION PANEL
ALK PHOS: 68 U/L (ref 25–125)
ALT: 26 U/L (ref 7–35)
AST: 28 U/L (ref 13–35)
Bilirubin, Total: 0.8 mg/dL

## 2013-10-19 LAB — LIPID PANEL
CHOLESTEROL: 192 mg/dL (ref 0–200)
HDL: 56 mg/dL (ref 35–70)
LDL CALC: 115 mg/dL
LDl/HDL Ratio: 3.4
Triglycerides: 103 mg/dL (ref 40–160)

## 2013-10-26 ENCOUNTER — Non-Acute Institutional Stay: Payer: Medicare Other | Admitting: Internal Medicine

## 2013-10-26 ENCOUNTER — Encounter: Payer: Self-pay | Admitting: Internal Medicine

## 2013-10-26 VITALS — BP 144/82 | HR 72 | Wt 174.0 lb

## 2013-10-26 DIAGNOSIS — K5790 Diverticulosis of intestine, part unspecified, without perforation or abscess without bleeding: Secondary | ICD-10-CM | POA: Insufficient documentation

## 2013-10-26 DIAGNOSIS — I4891 Unspecified atrial fibrillation: Secondary | ICD-10-CM

## 2013-10-26 DIAGNOSIS — I482 Chronic atrial fibrillation, unspecified: Secondary | ICD-10-CM

## 2013-10-26 DIAGNOSIS — E785 Hyperlipidemia, unspecified: Secondary | ICD-10-CM

## 2013-10-26 DIAGNOSIS — M810 Age-related osteoporosis without current pathological fracture: Secondary | ICD-10-CM

## 2013-10-26 DIAGNOSIS — N3942 Incontinence without sensory awareness: Secondary | ICD-10-CM

## 2013-10-26 DIAGNOSIS — Z9229 Personal history of other drug therapy: Secondary | ICD-10-CM

## 2013-10-26 DIAGNOSIS — Z8673 Personal history of transient ischemic attack (TIA), and cerebral infarction without residual deficits: Secondary | ICD-10-CM

## 2013-10-26 DIAGNOSIS — I10 Essential (primary) hypertension: Secondary | ICD-10-CM

## 2013-10-26 DIAGNOSIS — R32 Unspecified urinary incontinence: Secondary | ICD-10-CM | POA: Insufficient documentation

## 2013-10-26 DIAGNOSIS — Z7901 Long term (current) use of anticoagulants: Secondary | ICD-10-CM

## 2013-10-26 DIAGNOSIS — N3281 Overactive bladder: Secondary | ICD-10-CM

## 2013-10-26 HISTORY — DX: Age-related osteoporosis without current pathological fracture: M81.0

## 2013-10-26 HISTORY — DX: Diverticulosis of intestine, part unspecified, without perforation or abscess without bleeding: K57.90

## 2013-10-26 HISTORY — DX: Overactive bladder: N32.81

## 2013-10-26 MED ORDER — APIXABAN 5 MG PO TABS: ORAL_TABLET | ORAL | Status: DC

## 2013-11-08 ENCOUNTER — Other Ambulatory Visit: Payer: Self-pay | Admitting: Internal Medicine

## 2013-11-08 ENCOUNTER — Other Ambulatory Visit: Payer: Self-pay | Admitting: *Deleted

## 2013-11-08 MED ORDER — LOSARTAN POTASSIUM 100 MG PO TABS: ORAL_TABLET | ORAL | Status: DC

## 2013-11-08 NOTE — Telephone Encounter (Signed)
[place] 

## 2013-12-16 ENCOUNTER — Other Ambulatory Visit: Payer: Self-pay | Admitting: Internal Medicine

## 2013-12-25 ENCOUNTER — Encounter: Payer: Self-pay | Admitting: Internal Medicine

## 2013-12-25 DIAGNOSIS — Z8673 Personal history of transient ischemic attack (TIA), and cerebral infarction without residual deficits: Secondary | ICD-10-CM | POA: Insufficient documentation

## 2013-12-25 NOTE — Progress Notes (Signed)
Patient ID: Diana Petersen, female   DOB: September 26, 1929, 80 y.o.   MRN: [id number]    HISTORY AND PHYSICAL  Location:  [place] of Service: Clinic (12)   Extended Emergency Contact Information Primary Emergency Contact: [facility] Address: [place], [place] [address] [name] of [place] Phone: [phone] Mobile Phone: [phone] Relation: Son Secondary Emergency Contact: Alessandra Grout [place] of [place] Phone: [phone] Relation: Other   Chief Complaint  Patient presents with  . Medical Management of Chronic Issues    blood pressure, A-Fib, cholesterol (New patient saw [facility] 07/27/13)    HPI:  Previously seen by M. Mast on 07/27/13. Moved to [facility] 2013.  Chronic a-fib - anticoagulated with apixaban (ELIQUIS) 5 MG TABS tablet  HX: anticoagulation - apixaban (ELIQUIS) 5 MG TABS tablet for AF  Essential hypertension: controlled  Other and unspecified hyperlipidemia: needs check next visit  History of CVA (cerebrovascular accident): no residual deficit identified  Urinary incontinence without sensory awareness: using [place]    Past Medical History  Diagnosis Date  . Atrial fibrillation   . Hypertension   . Hyperlipemia   . Mitral valve regurgitation   . Asthma   . Gout   . Arthritis   . Spondylosis of cervical spine   . Diverticulitis   . History of CVA (cerebrovascular accident)     Left basal ganglia     Past Surgical History  Procedure Laterality Date  . Tonsillectomy  1938  . Thumb arthroscopy Right 2014    Dr. Burney Gauze  . Colonoscopy  2011    Dr. Cindie Laroche Jule Ser)    Patient Care Team: Estill Dooms, MD as PCP - General (Internal Medicine) Man Mast X, NP as Nurse Practitioner (Nurse Practitioner) Bo Merino, MD as Consulting Physician (Rheumatology) Curly Rim, MD as Consulting Physician (Internal Medicine)  History   Social History  . Marital  Status: Widowed    Spouse Name: N/A    Number of Children: N/A  . Years of Education: N/A   Occupational History  . retired Licensed conveyancer     Social History Main Topics  . Smoking status: Former Smoker    Quit date: 07/28/1979  . Smokeless tobacco: Never Used  . Alcohol Use: 0.6 oz/week    1 Glasses of wine per week     Comment: glass of wine couple nights a week  . Drug Use: No  . Sexual Activity: No   Other Topics Concern  . Not on file   Social History Narrative   Lives at [facility] since 2013   Widowed   Former smoker, stopped 1981   Alcohol one glass of wine couple nights a week   Exercise pool exercise 3 times a week   POA              reports that she quit smoking about 34 years ago. She has never used smokeless tobacco. She reports that she drinks about 0.6 oz of alcohol per week. She reports that she does not use illicit drugs.  Family History  Problem Relation Age of Onset  . Alzheimer's disease Mother   . Heart disease Father   . Heart disease Brother    Family Status  Relation Status Death Age  . Mother Deceased   . Father Deceased 72  . Sister Deceased 75    car accident 33  . Brother Alive   .  Son Alive   . Son Alive   . Son Alive     Immunization History  Administered Date(s) Administered  . Influenza-Unspecified 11/09/2012  . Pneumococcal Polysaccharide-23 02/09/2002  . Td 08/10/2003  . Tdap 08/06/2013  . Zoster 03/12/2005    Allergies  Allergen Reactions  . Codeine Nausea And Vomiting  . Penicillins Hives and Rash  . Sulfa Antibiotics Hives and Rash    Medications: Patient's Medications  New Prescriptions   APIXABAN (ELIQUIS) 5 MG TABS TABLET    One twice daily for anticoagulation  Previous Medications   ALBUTEROL (PROVENTIL HFA;VENTOLIN HFA) 108 (90 BASE) MCG/ACT INHALER    Inhale 2 puffs into the lungs every 6 (six) hours as needed for wheezing or shortness of breath.   CHERRY CONCENTRATE PO    Take 2 capsules by  mouth every evening. "Dark Cherry"   CHOLECALCIFEROL (VITAMIN D) 1000 UNITS TABLET    Take 1,000 Units by mouth every morning.   DILTIAZEM (CARDIZEM CD) 120 MG 24 HR CAPSULE    Take 120 mg by mouth every morning.    HYDROCHLOROTHIAZIDE (MICROZIDE) 12.5 MG CAPSULE    Take 12.5 mg by mouth every morning.    MULTIPLE VITAMINS-MINERALS (CENTRUM SILVER ADULT 50+ PO)    Take 1 tablet by mouth every morning.    OMEGA-3 FATTY ACIDS (FISH OIL) 1000 MG CAPS    Take 2 capsules by mouth every morning.   Modified Medications   Modified Medication Previous Medication   LOSARTAN (COZAAR) 100 MG TABLET losartan (COZAAR) 100 MG tablet      Take one tablet by mouth once daily to control blood pressure    TAKE 1 TABLET ONCE DAILY.   TOVIAZ 8 MG TB24 TABLET fesoterodine (TOVIAZ) 8 MG TB24 tablet      TAKE 1 TABLET ONCE DAILY.    Take 1 tablet (8 mg total) by mouth every evening.  Discontinued Medications   HYDROCODONE-ACETAMINOPHEN (NORCO/VICODIN) 5-325 MG PER TABLET    Take 1-2 tablets by mouth every 4 (four) hours as needed.   LOSARTAN (COZAAR) 100 MG TABLET    Take 100 mg by mouth every morning.    TURMERIC PO    Take 300 mg by mouth every morning.   WARFARIN (COUMADIN) 5 MG TABLET    Take one tablet by mouth once daily alternating with 2.5mg     Review of Systems  Constitutional: Negative for fever, chills, diaphoresis, activity change, appetite change, fatigue and unexpected weight change.  HENT: Negative for congestion, ear discharge, ear pain, hearing loss, postnasal drip, rhinorrhea, sore throat, tinnitus, trouble swallowing and voice change.   Eyes: Negative for pain, redness, itching and visual disturbance.  Respiratory: Negative for cough, choking, shortness of breath and wheezing.   Cardiovascular: Negative for chest pain, palpitations and leg swelling.       Chronic AF anticoagulated with apixaban  Gastrointestinal: Negative for nausea, abdominal pain, diarrhea, constipation and abdominal  distention.  Endocrine: Negative for cold intolerance, heat intolerance, polydipsia, polyphagia and polyuria.  Genitourinary: Negative for dysuria, urgency, frequency, hematuria, flank pain, vaginal discharge, difficulty urinating and pelvic pain.  Musculoskeletal: Negative for myalgias, back pain, arthralgias, gait problem, neck pain and neck stiffness.       Fracture of the  Left radius in July 2015 that required casting. Seeing Dr Chauncey Cruel. Deveshwar for pseudogout. Mainly involves knees. Uses tart cherry juice.  Skin: Negative for color change, pallor and rash.  Allergic/Immunologic: Negative.   Neurological: Negative for dizziness, tremors, seizures, syncope,  weakness, numbness and headaches.  Hematological: Negative for adenopathy. Does not bruise/bleed easily.  Psychiatric/Behavioral: Negative for suicidal ideas, hallucinations, behavioral problems, confusion, sleep disturbance, dysphoric mood and agitation. The patient is not nervous/anxious and is not hyperactive.     Filed Vitals:   10/26/13 1635  BP: 144/82  Pulse: 72  Weight: 174 lb (78.926 kg)   Body mass index is 31.82 kg/(m^2).  Physical Exam  Constitutional: She is oriented to person, place, and time. She appears well-developed and well-nourished. No distress.  HENT:  Right Ear: External ear normal.  Left Ear: External ear normal.  Nose: Nose normal.  Mouth/Throat: Oropharynx is clear and moist. No oropharyngeal exudate.  Eyes: Conjunctivae and EOM are normal. Pupils are equal, round, and reactive to light. No scleral icterus.  Neck: No JVD present. No tracheal deviation present. No thyromegaly present.  Cardiovascular: Normal rate, normal heart sounds and intact distal pulses.  Exam reveals no gallop and no friction rub.   No murmur heard. AF  Pulmonary/Chest: Effort normal. No respiratory distress. She has no wheezes. She has no rales. She exhibits no tenderness.  Abdominal: She exhibits no distension and no mass. There  is no tenderness.  Musculoskeletal: Normal range of motion. She exhibits no edema or tenderness.  Lymphadenopathy:    She has no cervical adenopathy.  Neurological: She is alert and oriented to person, place, and time. No cranial nerve deficit. Coordination normal.  Skin: No rash noted. She is not diaphoretic. No erythema. No pallor.  Psychiatric: She has a normal mood and affect. Her behavior is normal. Judgment and thought content normal.     Labs reviewed: Nursing Home on 10/26/2013  Component Date Value Ref Range Status  . Hemoglobin 10/19/2013 14.3  12.0 - 16.0 g/dL Final  . HCT 10/19/2013 41  36 - 46 % Final  . Platelets 10/19/2013 247  150 - 399 K/L Final  . WBC 10/19/2013 4.7   Final  . Glucose 10/19/2013 85   Final  . BUN 10/19/2013 19  4 - 21 mg/dL Final  . Creatinine 10/19/2013 0.8  0.5 - 1.1 mg/dL Final  . Potassium 10/19/2013 4.1  3.4 - 5.3 mmol/L Final  . Sodium 10/19/2013 141  137 - 147 mmol/L Final  . LDl/HDL Ratio 10/19/2013 3.4   Final  . Triglycerides 10/19/2013 103  40 - 160 mg/dL Final  . Cholesterol 10/19/2013 192  0 - 200 mg/dL Final  . HDL 10/19/2013 56  35 - 70 mg/dL Final  . LDL Cholesterol 10/19/2013 115   Final  . Alkaline Phosphatase 10/19/2013 68  25 - 125 U/L Final  . ALT 10/19/2013 26  7 - 35 U/L Final  . AST 10/19/2013 28  13 - 35 U/L Final  . Bilirubin, Total 10/19/2013 0.8   Final  . TSH 10/19/2013 1.70  0.41 - 5.90 uIU/mL Final  Admission on 08/06/2013, Discharged on 08/06/2013  Component Date Value Ref Range Status  . WBC 08/06/2013 6.3  4.0 - 10.5 K/uL Final  . RBC 08/06/2013 4.30  3.87 - 5.11 MIL/uL Final  . Hemoglobin 08/06/2013 13.6  12.0 - 15.0 g/dL Final  . HCT 08/06/2013 40.2  36.0 - 46.0 % Final  . MCV 08/06/2013 93.5  78.0 - 100.0 fL Final  . MCH 08/06/2013 31.6  26.0 - 34.0 pg Final  . MCHC 08/06/2013 33.8  30.0 - 36.0 g/dL Final  . RDW 08/06/2013 13.8  11.5 - 15.5 % Final  . Platelets 08/06/2013 221  150 -  400 K/uL Final  .  Neutrophils Relative % 08/06/2013 73  43 - 77 % Final  . Neutro Abs 08/06/2013 4.6  1.7 - 7.7 K/uL Final  . Lymphocytes Relative 08/06/2013 20  12 - 46 % Final  . Lymphs Abs 08/06/2013 1.2  0.7 - 4.0 K/uL Final  . Monocytes Relative 08/06/2013 6  3 - 12 % Final  . Monocytes Absolute 08/06/2013 0.4  0.1 - 1.0 K/uL Final  . Eosinophils Relative 08/06/2013 1  0 - 5 % Final  . Eosinophils Absolute 08/06/2013 0.1  0.0 - 0.7 K/uL Final  . Basophils Relative 08/06/2013 0  0 - 1 % Final  . Basophils Absolute 08/06/2013 0.0  0.0 - 0.1 K/uL Final  . Prothrombin Time 08/06/2013 24.7* 11.6 - 15.2 seconds Final  . INR 08/06/2013 2.23* 0.00 - 1.49 Final  Nursing Home on 07/27/2013  Component Date Value Ref Range Status  . Glucose 09/09/2012 91   Final  . BUN 09/09/2012 18  4 - 21 mg/dL Final  . Creatinine 09/09/2012 0.9  0.5 - 1.1 mg/dL Final  . Potassium 09/09/2012 4.2  3.4 - 5.3 mmol/L Final  . Sodium 09/09/2012 140  137 - 147 mmol/L Final  . Alkaline Phosphatase 09/09/2012 67  25 - 125 U/L Final  . ALT 09/09/2012 21  7 - 35 U/L Final  . AST 09/09/2012 25  13 - 35 U/L Final  . Bilirubin, Total 09/09/2012 0.7   Final    No results found.   Assessment/Plan  1. Chronic a-fib - apixaban (ELIQUIS) 5 MG TABS tablet; One twice daily for anticoagulation  Dispense: 60 tablet; Refill: 5  2. HX: anticoagulation - apixaban (ELIQUIS) 5 MG TABS tablet; One twice daily for anticoagulation  Dispense: 60 tablet; Refill: 5  3. Essential hypertension Controlled -BMP,, future  4. Other and unspecified hyperlipidemia -lipids, future  5. History of CVA (cerebrovascular accident) observe  6. Urinary incontinence without sensory awareness Continue Toviaz

## 2014-01-09 ENCOUNTER — Encounter: Payer: Self-pay | Admitting: Internal Medicine

## 2014-01-11 ENCOUNTER — Encounter: Payer: Self-pay | Admitting: Nurse Practitioner

## 2014-01-25 ENCOUNTER — Encounter: Payer: Self-pay | Admitting: Nurse Practitioner

## 2014-02-22 ENCOUNTER — Encounter: Payer: Self-pay | Admitting: Nurse Practitioner

## 2014-02-22 ENCOUNTER — Other Ambulatory Visit: Payer: Self-pay

## 2014-02-22 ENCOUNTER — Non-Acute Institutional Stay: Payer: Medicare Other | Admitting: Nurse Practitioner

## 2014-02-22 VITALS — BP 130/62 | HR 74 | Temp 97.6°F | Wt 174.0 lb

## 2014-02-22 DIAGNOSIS — I872 Venous insufficiency (chronic) (peripheral): Secondary | ICD-10-CM

## 2014-02-22 DIAGNOSIS — I10 Essential (primary) hypertension: Secondary | ICD-10-CM

## 2014-02-22 DIAGNOSIS — I482 Chronic atrial fibrillation, unspecified: Secondary | ICD-10-CM

## 2014-02-22 DIAGNOSIS — Z8673 Personal history of transient ischemic attack (TIA), and cerebral infarction without residual deficits: Secondary | ICD-10-CM

## 2014-02-22 DIAGNOSIS — Z9229 Personal history of other drug therapy: Secondary | ICD-10-CM

## 2014-02-22 DIAGNOSIS — Z7901 Long term (current) use of anticoagulants: Secondary | ICD-10-CM

## 2014-02-22 DIAGNOSIS — N3942 Incontinence without sensory awareness: Secondary | ICD-10-CM

## 2014-02-22 HISTORY — DX: Venous insufficiency (chronic) (peripheral): I87.2

## 2014-02-22 MED ORDER — FESOTERODINE FUMARATE ER 8 MG PO TB24
8.0000 mg | ORAL_TABLET | Freq: Every day | ORAL | Status: DC
Start: 2014-02-22 — End: 2014-06-12

## 2014-02-22 MED ORDER — HYDROCHLOROTHIAZIDE 25 MG PO TABS: 25.0000 mg | ORAL_TABLET | Freq: Every day | ORAL | Status: DC

## 2014-02-22 NOTE — Assessment & Plan Note (Addendum)
Managed with Toviaz 8mg  daily. X2/night.

## 2014-02-22 NOTE — Assessment & Plan Note (Signed)
Varicose veins and edema-pedal pulses 1+ equal, taking HCTZ 12.5mg -increase to 25mg  daily, update BMP prior to next appointment. Denied SOB, cough, sputum production. She admitted she urinates more at night. X2/night.

## 2014-02-22 NOTE — Assessment & Plan Note (Signed)
Rate controlled, takes Diltiazem SR 24hr 120mg  daily. Eliquis for thromboembolic risk reduction.

## 2014-02-22 NOTE — Assessment & Plan Note (Addendum)
Serum Ca 10.3 PTH 26(15-65). Taking Vit D 1000u instead of 5000u now. Off Ca

## 2014-02-22 NOTE — Assessment & Plan Note (Signed)
For A-fib. Continue Eliquis.

## 2014-02-22 NOTE — Assessment & Plan Note (Signed)
Blood pressure is controlled. Takes Diltiazem SR 120mg , HCTZ 12.5mg , and Losartan 100mg  daily.

## 2014-02-22 NOTE — Progress Notes (Signed)
Patient ID: Diana Petersen, female   DOB: 10-May-1929, 81 y.o.   MRN: [id number]   Code Status: Full code.   Allergies  Allergen Reactions  . Codeine Nausea And Vomiting  . Penicillins Hives and Rash  . Sulfa Antibiotics Hives and Rash    Chief Complaint  Patient presents with  . Medical Management of Chronic Issues    A-Fib, blood pressure  . Leg Swelling    and ankles bilateral    HPI: Patient is a 81 y.o. female seen in the clinic at [facility] today for new patient establishment and other chronic medical conditions.  Problem List Items Addressed This Visit    Venous (peripheral) insufficiency    Varicose veins and edema-pedal pulses 1+ equal, taking HCTZ 12.5mg -increase to 25mg  daily, update BMP prior to next appointment. Denied SOB, cough, sputum production. She admitted she urinates more at night. X2/night.       Urinary incontinence without sensory awareness - Primary    Managed with Toviaz 8mg  daily. X2/night.        Hypercalcemia    Serum Ca 10.3 PTH 26(15-65). Taking Vit D 1000u instead of 5000u now. Off Ca       HX: anticoagulation    For A-fib. Continue Eliquis.        HTN (hypertension)    Blood pressure is controlled. Takes Diltiazem SR 120mg , HCTZ 12.5mg , and Losartan 100mg  daily.        History of CVA (cerebrovascular accident)    Left basal ganglia       Chronic a-fib    Rate controlled, takes Diltiazem SR 24hr 120mg  daily. Eliquis for thromboembolic risk reduction.           Review of Systems:  Review of Systems  Constitutional: Negative for fever, chills, weight loss, malaise/fatigue and diaphoresis.       Weight gain about #30 Ibs/3 years   HENT: Positive for hearing loss. Negative for congestion, ear discharge, ear pain, nosebleeds, sore throat and tinnitus.   Eyes: Negative for blurred vision, double vision, photophobia, pain, discharge and redness.  Respiratory: Negative for cough, hemoptysis, sputum production, shortness  of breath, wheezing and stridor.   Cardiovascular: Positive for leg swelling. Negative for chest pain, palpitations, orthopnea, claudication and PND.       Trace BLE  Gastrointestinal: Negative for heartburn, nausea, vomiting, abdominal pain, diarrhea, constipation, blood in stool and melena.  Genitourinary: Positive for frequency. Negative for dysuria, urgency, hematuria and flank pain.  Musculoskeletal: Positive for joint pain. Negative for myalgias, back pain, falls and neck pain.       Knees  Skin: Negative for itching and rash.  Neurological: Negative for dizziness, tingling, tremors, sensory change, speech change, focal weakness, seizures, loss of consciousness, weakness and headaches.  Endo/Heme/Allergies: Negative for environmental allergies and polydipsia. Does not bruise/bleed easily.  Psychiatric/Behavioral: Negative for depression, suicidal ideas, hallucinations, memory loss and substance abuse. The patient is not nervous/anxious and does not have insomnia.      Past Medical History  Diagnosis Date  . Atrial fibrillation   . Hypertension   . Hyperlipemia   . Mitral valve regurgitation   . Asthma   . Gout   . Arthritis   . Spondylosis of cervical spine   . Diverticulitis   . History of CVA (cerebrovascular accident)     Left basal ganglia    Past Surgical History  Procedure Laterality Date  . Tonsillectomy  1938  . Thumb arthroscopy Right 2014  Dr. Burney Gauze  . Colonoscopy  2011    Dr. Cindie Laroche Jule Ser)   Social History:   reports that she quit smoking about 34 years ago. She has never used smokeless tobacco. She reports that she drinks about 0.6 oz of alcohol per week. She reports that she does not use illicit drugs.  Family History  Problem Relation Age of Onset  . Alzheimer's disease Mother   . Heart disease Father   . Heart disease Brother     Medications: Patient's Medications  New Prescriptions   No medications on file  Previous Medications    ALBUTEROL (PROVENTIL HFA;VENTOLIN HFA) 108 (90 BASE) MCG/ACT INHALER    Inhale 2 puffs into the lungs every 6 (six) hours as needed for wheezing or shortness of breath.   APIXABAN (ELIQUIS) 5 MG TABS TABLET    One twice daily for anticoagulation   CHERRY CONCENTRATE PO    Take 2 capsules by mouth every evening. "Dark Cherry"   CHOLECALCIFEROL (VITAMIN D) 1000 UNITS TABLET    Take 1,000 Units by mouth every morning.   DILTIAZEM (CARDIZEM CD) 120 MG 24 HR CAPSULE    Take 120 mg by mouth every morning.    LOSARTAN (COZAAR) 100 MG TABLET    Take one tablet by mouth once daily to control blood pressure   MULTIPLE VITAMINS-MINERALS (CENTRUM SILVER ADULT 50+ PO)    Take 1 tablet by mouth every morning.    OMEGA-3 FATTY ACIDS (FISH OIL) 1000 MG CAPS    Take 2 capsules by mouth every morning.   Modified Medications   Modified Medication Previous Medication   FESOTERODINE (TOVIAZ) 8 MG TB24 TABLET TOVIAZ 8 MG TB24 tablet      Take 1 tablet (8 mg total) by mouth daily.    TAKE 1 TABLET ONCE DAILY.   HYDROCHLOROTHIAZIDE (HYDRODIURIL) 25 MG TABLET hydrochlorothiazide (HYDRODIURIL) 25 MG tablet      Take 1 tablet (25 mg total) by mouth daily.    Take 25 mg by mouth daily.  Discontinued Medications   HYDROCHLOROTHIAZIDE (MICROZIDE) 12.5 MG CAPSULE    Take 12.5 mg by mouth every morning.      Physical Exam: Physical Exam  Constitutional: She is oriented to person, place, and time. She appears well-developed and well-nourished. No distress.  HENT:  Head: Normocephalic and atraumatic.  Right Ear: External ear normal.  Left Ear: External ear normal.  Nose: Nose normal.  Mouth/Throat: Oropharynx is clear and moist. No oropharyngeal exudate.  Eyes: Conjunctivae and EOM are normal. Pupils are equal, round, and reactive to light. Right eye exhibits no discharge. Left eye exhibits no discharge. No scleral icterus.  Neck: Normal range of motion. Neck supple. No JVD present. No tracheal deviation present. No  thyromegaly present.  Cardiovascular: Normal rate, normal heart sounds and intact distal pulses.  An irregular rhythm present.  No murmur heard. Pulmonary/Chest: Effort normal and breath sounds normal. No stridor. No respiratory distress. She has no wheezes. She has no rales. She exhibits no tenderness.  Abdominal: Soft. Bowel sounds are normal. She exhibits no distension and no mass. There is no tenderness. There is no rebound and no guarding.  Genitourinary: Guaiac negative stool.  Musculoskeletal: Normal range of motion. She exhibits edema and tenderness.  BLE trace edema. Varicose veins.  Lymphadenopathy:    She has no cervical adenopathy.  Neurological: She is alert and oriented to person, place, and time. She has normal reflexes. No cranial nerve deficit. She exhibits normal muscle tone. Coordination normal.  Skin: Skin is warm and dry. No rash noted. She is not diaphoretic. No erythema. No pallor.  Psychiatric: She has a normal mood and affect. Her behavior is normal. Judgment and thought content normal.    Filed Vitals:   02/22/14 1345  BP: 130/62  Pulse: 74  Temp: 97.6 F (36.4 C)  TempSrc: Oral  Weight: 174 lb (78.926 kg)      Labs reviewed: Basic Metabolic Panel:  Recent Labs  10/19/13  NA 141  K 4.1  BUN 19  CREATININE 0.8  TSH 1.70   Liver Function Tests:  Recent Labs  10/19/13  AST 28  ALT 26  ALKPHOS 68   No results for input(s): LIPASE, AMYLASE in the last 8760 hours. No results for input(s): AMMONIA in the last 8760 hours. CBC:  Recent Labs  08/06/13 1329 10/19/13  WBC 6.3 4.7  NEUTROABS 4.6  --   HGB 13.6 14.3  HCT 40.2 41  MCV 93.5  --   PLT 221 247   Lipid Panel:  Recent Labs  10/19/13  CHOL 192  HDL 56  LDLCALC 115  TRIG 103   Anemia Panel: No results for input(s): FOLATE, IRON, VITAMINB12 in the last 8760 hours.  Past Procedures:  09/13/12 mammogram:   IMPRESSION:  Bilateral benign breast calcifications, compatible with  small  degenerated fibroadenomas. No evidence of malignancy.   02/21/13 X-ray left wrist:  IMPRESSION:  There is no evidence of an acute fracture nor dislocation of the  bones of the left wrist. There are mild degenerative changes of the  1st carpometacarpal joint.  02/21/13 CT head w/o CM:  IMPRESSION:  Subcutaneous hematoma left frontal/ supraorbital/ pre orbital region  without underlying fracture or intracranial hemorrhage.  Small vessel disease type changes without CT evidence of large acute  infarct.  Global atrophy without hydrocephalus.   06/16/13 CXR  No active disease.    Assessment/Plan Pseudogout of knee Per progress noted 05/24/13 Dr. Cari Caraway: since her diagnosis of "gout" was made based solely on X-ray. Uric acid 5.5.  No gouty attacks. Continued with HCTZ    Urinary incontinence without sensory awareness Managed with Toviaz 8mg  daily. X2/night.     Hypercalcemia Serum Ca 10.3 PTH 26(15-65). Taking Vit D 1000u instead of 5000u now. Off Ca    HX: anticoagulation For A-fib. Continue Eliquis.     HTN (hypertension) Blood pressure is controlled. Takes Diltiazem SR 120mg , HCTZ 12.5mg , and Losartan 100mg  daily.     History of CVA (cerebrovascular accident) Left basal ganglia    Chronic a-fib Rate controlled, takes Diltiazem SR 24hr 120mg  daily. Eliquis for thromboembolic risk reduction.     Venous (peripheral) insufficiency Varicose veins and edema-pedal pulses 1+ equal, taking HCTZ 12.5mg -increase to 25mg  daily, update BMP prior to next appointment. Denied SOB, cough, sputum production. She admitted she urinates more at night. X2/night.      Family/ Staff Communication: observe the patient.   Goals of Care: IL  Labs/tests ordered: BMP

## 2014-02-22 NOTE — Assessment & Plan Note (Signed)
Per progress noted 05/24/13 Dr. Cari Caraway: since her diagnosis of "gout" was made based solely on X-ray. Uric acid 5.5.  No gouty attacks. Continued with HCTZ

## 2014-02-22 NOTE — Assessment & Plan Note (Signed)
Left basal ganglia

## 2014-03-22 ENCOUNTER — Non-Acute Institutional Stay: Payer: Medicare Other | Admitting: Nurse Practitioner

## 2014-03-22 ENCOUNTER — Encounter: Payer: Self-pay | Admitting: Nurse Practitioner

## 2014-03-22 VITALS — BP 132/84 | HR 74 | Temp 97.5°F | Wt 174.0 lb

## 2014-03-22 DIAGNOSIS — I872 Venous insufficiency (chronic) (peripheral): Secondary | ICD-10-CM

## 2014-03-22 DIAGNOSIS — I482 Chronic atrial fibrillation, unspecified: Secondary | ICD-10-CM

## 2014-03-22 DIAGNOSIS — N3942 Incontinence without sensory awareness: Secondary | ICD-10-CM

## 2014-03-22 DIAGNOSIS — Z9229 Personal history of other drug therapy: Secondary | ICD-10-CM

## 2014-03-22 DIAGNOSIS — I10 Essential (primary) hypertension: Secondary | ICD-10-CM

## 2014-03-22 DIAGNOSIS — M11261 Other chondrocalcinosis, right knee: Secondary | ICD-10-CM

## 2014-03-22 DIAGNOSIS — Z7901 Long term (current) use of anticoagulants: Secondary | ICD-10-CM

## 2014-03-22 DIAGNOSIS — M11861 Other specified crystal arthropathies, right knee: Secondary | ICD-10-CM

## 2014-03-22 DIAGNOSIS — L600 Ingrowing nail: Secondary | ICD-10-CM

## 2014-03-22 DIAGNOSIS — Z8673 Personal history of transient ischemic attack (TIA), and cerebral infarction without residual deficits: Secondary | ICD-10-CM

## 2014-03-22 NOTE — Progress Notes (Signed)
Patient ID: Diana Petersen, female   DOB: 1929-03-06, 81 y.o.   MRN: [id number]   Code Status: Full code.   Allergies  Allergen Reactions  . Codeine Nausea And Vomiting  . Penicillins Hives and Rash  . Sulfa Antibiotics Hives and Rash    Chief Complaint  Patient presents with  . Toe Pain    large and second toe on right foot red and tender for 4 days.  . Leg Swelling    and feet     HPI: Patient is a 81 y.o. female seen in the clinic at [facility] today for medial right great toe infected ingrowing toenail and other chronic medical conditions.  Problem List Items Addressed This Visit    Venous (peripheral) insufficiency    Varicose veins and edema-pedal pulses 1+ equal, taking HCTZ 12.5mg -increase to 25mg  daily, update BMP prior to next appointment. Denied SOB, cough, sputum production. She admitted she urinates more at night. X2/night.  03/22/14 chronic BLE 1+, continue HCTZ 25mg  daily.        Urinary incontinence without sensory awareness    Managed with Toviaz 8mg  daily. X2/night.         Pseudogout of knee    Per progress noted 05/24/13 Dr. Cari Caraway: since her diagnosis of "gout" was made based solely on X-ray. Uric acid 5.5.  No gouty attacks.      Ingrowing right great toenail    Medial aspect-trimmed-will apply Bactroban daily until healed.       Hypercalcemia    Serum Ca 10.3 PTH 26(15-65). Taking Vit D 1000u instead of 5000u now. Off Ca        HX: anticoagulation    For A-fib. Continue Eliquis.         HTN (hypertension) - Primary    Blood pressure is controlled. Takes Diltiazem SR 120mg , HCTZ 25mg , and Losartan 100mg  daily.         History of CVA (cerebrovascular accident)    noted      Chronic a-fib    Rate controlled, takes Diltiazem SR 24hr 120mg  daily. Eliquis for thromboembolic risk reduction.            Review of Systems:  Review of Systems  Constitutional: Negative for fever, chills, weight loss, malaise/fatigue  and diaphoresis.  HENT: Positive for hearing loss. Negative for congestion, ear discharge, ear pain, nosebleeds, sore throat and tinnitus.   Eyes: Negative for blurred vision, double vision, photophobia, pain, discharge and redness.  Respiratory: Negative for cough, hemoptysis, sputum production, shortness of breath, wheezing and stridor.   Cardiovascular: Positive for leg swelling. Negative for chest pain, palpitations, orthopnea, claudication and PND.  Gastrointestinal: Negative for heartburn, nausea, vomiting, abdominal pain, diarrhea, constipation, blood in stool and melena.  Genitourinary: Positive for frequency. Negative for dysuria, urgency, hematuria and flank pain.  Musculoskeletal: Positive for joint pain. Negative for myalgias, back pain, falls and neck pain.  Skin: Negative for itching and rash.       Medial right great toenail trimmed. Mild paronychia seen.   Neurological: Negative for dizziness, tingling, tremors, sensory change, speech change, focal weakness, seizures, loss of consciousness, weakness and headaches.  Endo/Heme/Allergies: Negative for environmental allergies and polydipsia. Bruises/bleeds easily.  Psychiatric/Behavioral: Negative for depression, suicidal ideas, hallucinations, memory loss and substance abuse. The patient is not nervous/anxious and does not have insomnia.      Past Medical History  Diagnosis Date  . Atrial fibrillation   . Hypertension   . Hyperlipemia   .  Mitral valve regurgitation   . Asthma   . Gout   . Arthritis   . Spondylosis of cervical spine   . Diverticulitis   . History of CVA (cerebrovascular accident)     Left basal ganglia    Past Surgical History  Procedure Laterality Date  . Tonsillectomy  1938  . Thumb arthroscopy Right 2014    Dr. Burney Gauze  . Colonoscopy  2011    Dr. Cindie Laroche Jule Ser)   Social History:   reports that she quit smoking about 34 years ago. She has never used smokeless tobacco. She reports that  she drinks about 0.6 oz of alcohol per week. She reports that she does not use illicit drugs.  Family History  Problem Relation Age of Onset  . Alzheimer's disease Mother   . Heart disease Father   . Heart disease Brother     Medications: Patient's Medications  New Prescriptions   No medications on file  Previous Medications   ALBUTEROL (PROVENTIL HFA;VENTOLIN HFA) 108 (90 BASE) MCG/ACT INHALER    Inhale 2 puffs into the lungs every 6 (six) hours as needed for wheezing or shortness of breath.   APIXABAN (ELIQUIS) 5 MG TABS TABLET    One twice daily for anticoagulation   CHERRY CONCENTRATE PO    Take 2 capsules by mouth every evening. "Dark Cherry"   CHOLECALCIFEROL (VITAMIN D) 1000 UNITS TABLET    Take by mouth every morning. Take 2,000 units daily   DILTIAZEM (CARDIZEM CD) 120 MG 24 HR CAPSULE    Take 120 mg by mouth every morning.    FESOTERODINE (TOVIAZ) 8 MG TB24 TABLET    Take 1 tablet (8 mg total) by mouth daily.   HYDROCHLOROTHIAZIDE (HYDRODIURIL) 25 MG TABLET    Take 1 tablet (25 mg total) by mouth daily.   LOSARTAN (COZAAR) 100 MG TABLET    Take one tablet by mouth once daily to control blood pressure   MULTIPLE VITAMINS-MINERALS (CENTRUM SILVER ADULT 50+ PO)    Take 1 tablet by mouth every morning.    OMEGA-3 FATTY ACIDS (FISH OIL) 1000 MG CAPS    Take 2 capsules by mouth every morning.   Modified Medications   No medications on file  Discontinued Medications   No medications on file     Physical Exam: Physical Exam  Constitutional: She is oriented to person, place, and time. She appears well-developed and well-nourished. No distress.  HENT:  Head: Normocephalic and atraumatic.  Right Ear: External ear normal.  Left Ear: External ear normal.  Nose: Nose normal.  Mouth/Throat: Oropharynx is clear and moist. No oropharyngeal exudate.  Eyes: Conjunctivae and EOM are normal. Pupils are equal, round, and reactive to light. Right eye exhibits no discharge. Left eye  exhibits no discharge. No scleral icterus.  Neck: Normal range of motion. Neck supple. No JVD present. No tracheal deviation present. No thyromegaly present.  Cardiovascular: Normal rate, normal heart sounds and intact distal pulses.  An irregular rhythm present.  No murmur heard. Pulmonary/Chest: Effort normal and breath sounds normal. No stridor. No respiratory distress. She has no wheezes. She has no rales. She exhibits no tenderness.  Abdominal: Soft. Bowel sounds are normal. She exhibits no distension and no mass. There is no tenderness. There is no rebound and no guarding.  Genitourinary: Guaiac negative stool.  Musculoskeletal: Normal range of motion. She exhibits edema and tenderness.  BLE trace edema. Varicose veins.  Lymphadenopathy:    She has no cervical adenopathy.  Neurological: She  is alert and oriented to person, place, and time. She has normal reflexes. No cranial nerve deficit. She exhibits normal muscle tone. Coordination normal.  Skin: Skin is warm and dry. No rash noted. She is not diaphoretic. No erythema. No pallor.  Right great ingrowing toenail trimmed. Mild paronychia noted.   Psychiatric: She has a normal mood and affect. Her behavior is normal. Judgment and thought content normal.    Filed Vitals:   03/22/14 1532  BP: 132/84  Pulse: 74  Temp: 97.5 F (36.4 C)  TempSrc: Oral  Weight: 174 lb (78.926 kg)      Labs reviewed: Basic Metabolic Panel:  Recent Labs  10/19/13  NA 141  K 4.1  BUN 19  CREATININE 0.8  TSH 1.70   Liver Function Tests:  Recent Labs  10/19/13  AST 28  ALT 26  ALKPHOS 68   No results for input(s): LIPASE, AMYLASE in the last 8760 hours. No results for input(s): AMMONIA in the last 8760 hours. CBC:  Recent Labs  08/06/13 1329 10/19/13  WBC 6.3 4.7  NEUTROABS 4.6  --   HGB 13.6 14.3  HCT 40.2 41  MCV 93.5  --   PLT 221 247   Lipid Panel:  Recent Labs  10/19/13  CHOL 192  HDL 56  LDLCALC 115  TRIG 103    Anemia Panel: No results for input(s): FOLATE, IRON, VITAMINB12 in the last 8760 hours.  Past Procedures:  09/13/12 mammogram:   IMPRESSION:  Bilateral benign breast calcifications, compatible with small  degenerated fibroadenomas. No evidence of malignancy.   02/21/13 X-ray left wrist:  IMPRESSION:  There is no evidence of an acute fracture nor dislocation of the  bones of the left wrist. There are mild degenerative changes of the  1st carpometacarpal joint.  02/21/13 CT head w/o CM:  IMPRESSION:  Subcutaneous hematoma left frontal/ supraorbital/ pre orbital region  without underlying fracture or intracranial hemorrhage.  Small vessel disease type changes without CT evidence of large acute  infarct.  Global atrophy without hydrocephalus.   06/16/13 CXR  No active disease.    Assessment/Plan HTN (hypertension) Blood pressure is controlled. Takes Diltiazem SR 120mg , HCTZ 25mg , and Losartan 100mg  daily.      Chronic a-fib Rate controlled, takes Diltiazem SR 24hr 120mg  daily. Eliquis for thromboembolic risk reduction.      HX: anticoagulation For A-fib. Continue Eliquis.      Urinary incontinence without sensory awareness Managed with Toviaz 8mg  daily. X2/night.      Pseudogout of knee Per progress noted 05/24/13 Dr. Cari Caraway: since her diagnosis of "gout" was made based solely on X-ray. Uric acid 5.5.  No gouty attacks.   Hypercalcemia Serum Ca 10.3 PTH 26(15-65). Taking Vit D 1000u instead of 5000u now. Off Ca     History of CVA (cerebrovascular accident) noted   Venous (peripheral) insufficiency Varicose veins and edema-pedal pulses 1+ equal, taking HCTZ 12.5mg -increase to 25mg  daily, update BMP prior to next appointment. Denied SOB, cough, sputum production. She admitted she urinates more at night. X2/night.  03/22/14 chronic BLE 1+, continue HCTZ 25mg  daily.     Ingrowing right great toenail Medial aspect-trimmed-will apply Bactroban  daily until healed.      Family/ Staff Communication: observe the patient.   Goals of Care: IL  Labs/tests ordered: none

## 2014-03-22 NOTE — Assessment & Plan Note (Addendum)
Blood pressure is controlled. Takes Diltiazem SR 120mg , HCTZ 25mg , and Losartan 100mg  daily.

## 2014-03-22 NOTE — Assessment & Plan Note (Addendum)
Per progress noted 05/24/13 Dr. Cari Caraway: since her diagnosis of "gout" was made based solely on X-ray. Uric acid 5.5.  No gouty attacks.

## 2014-03-22 NOTE — Assessment & Plan Note (Signed)
Rate controlled, takes Diltiazem SR 24hr 120mg  daily. Eliquis for thromboembolic risk reduction.

## 2014-03-22 NOTE — Assessment & Plan Note (Addendum)
Varicose veins and edema-pedal pulses 1+ equal, taking HCTZ 12.5mg -increase to 25mg  daily, update BMP prior to next appointment. Denied SOB, cough, sputum production. She admitted she urinates more at night. X2/night.  03/22/14 chronic BLE 1+, continue HCTZ 25mg  daily.

## 2014-03-22 NOTE — Assessment & Plan Note (Signed)
Serum Ca 10.3 PTH 26(15-65). Taking Vit D 1000u instead of 5000u now. Off Ca

## 2014-03-22 NOTE — Assessment & Plan Note (Signed)
For A-fib. Continue Eliquis.

## 2014-03-22 NOTE — Assessment & Plan Note (Signed)
noted 

## 2014-03-22 NOTE — Assessment & Plan Note (Signed)
Managed with Toviaz 8mg  daily. X2/night.

## 2014-03-22 NOTE — Assessment & Plan Note (Signed)
Medial aspect-trimmed-will apply Bactroban daily until healed.

## 2014-04-05 ENCOUNTER — Ambulatory Visit (INDEPENDENT_AMBULATORY_CARE_PROVIDER_SITE_OTHER): Payer: Medicare Other | Admitting: Nurse Practitioner

## 2014-04-05 ENCOUNTER — Encounter: Payer: Self-pay | Admitting: Nurse Practitioner

## 2014-04-05 VITALS — BP 122/76 | HR 92 | Temp 97.3°F | Wt 171.0 lb

## 2014-04-05 DIAGNOSIS — J040 Acute laryngitis: Secondary | ICD-10-CM

## 2014-04-05 NOTE — Patient Instructions (Addendum)
STOP benadryl it has bad side effects   Can take delsym every 12 hours as needed for cough  Cont to use cough drops Keep hydrated, increase water  May use Claritin 10 mg daily for runny nose and sneezing (not benadryl)   Laryngitis At the top of your windpipe is your voice box. It is the source of your voice. Inside your voice box are 2 bands of muscles called vocal cords. When you breathe, your vocal cords are relaxed and open so that air can get into the lungs. When you decide to say something, these cords come together and vibrate. The sound from these vibrations goes into your throat and comes out through your mouth as sound. Laryngitis is an inflammation of the vocal cords that causes hoarseness, cough, loss of voice, sore throat, and dry throat. Laryngitis can be temporary (acute) or long-term (chronic). Most cases of acute laryngitis improve with time.Chronic laryngitis lasts for more than 3 weeks. CAUSES Laryngitis can often be related to excessive smoking, talking, or yelling, as well as inhalation of toxic fumes and allergies. Acute laryngitis is usually caused by a viral infection, vocal strain, measles or mumps, or bacterial infections. Chronic laryngitis is usually caused by vocal cord strain, vocal cord injury, postnasal drip, growths on the vocal cords, or acid reflux. SYMPTOMS   Cough.  Sore throat.  Dry throat. RISK FACTORS  Respiratory infections.  Exposure to irritating substances, such as cigarette smoke, excessive amounts of alcohol, stomach acids, and workplace chemicals.  Voice trauma, such as vocal cord injury from shouting or speaking too loud. DIAGNOSIS  Your cargiver will perform a physical exam. During the physical exam, your caregiver will examine your throat. The most common sign of laryngitis is hoarseness. Laryngoscopy may be necessary to confirm the diagnosis of this condition. This procedure allows your caregiver to look into the larynx. HOME CARE  INSTRUCTIONS  Drink enough fluids to keep your urine clear or pale yellow.  Rest until you no longer have symptoms or as directed by your caregiver.  Breathe in moist air.  Take all medicine as directed by your caregiver.  Do not smoke.  Talk as little as possible (this includes whispering).  Write on paper instead of talking until your voice is back to normal.  Follow up with your caregiver if your condition has not improved after 10 days. SEEK MEDICAL CARE IF:   You have trouble breathing.  You cough up blood.  You have persistent fever.  You have increasing pain.  You have difficulty swallowing. MAKE SURE YOU:  Understand these instructions.  Will watch your condition.  Will get help right away if you are not doing well or get worse. Document Released: 01/26/2005 Document Revised: 04/20/2011 Document Reviewed: 04/03/2010 [facility] Patient Information 2015 [place], [place]. This information is not intended to replace advice given to you by your health care provider. Make sure you discuss any questions you have with your health care provider.

## 2014-04-05 NOTE — Progress Notes (Signed)
Patient ID: Diana Petersen, female   DOB: 1929/08/26, 81 y.o.   MRN: [id number]    PCP: Estill Dooms, MD  Allergies  Allergen Reactions  . Codeine Nausea And Vomiting  . Penicillins Hives and Rash  . Sulfa Antibiotics Hives and Rash    Chief Complaint  Patient presents with  . Acute Visit    cough, sneeze for one week. Taking Benadryl 25mg  one at night "knocks her out"      HPI: Patient is a 81 y.o. female seen in the office today for CC: coughing like crazy and shes tired of it.  Has been taking benadryl every night which knocks her out.  Cough all the time and cant quit. Makes her gag.  Has been going on for 2 weeks. Sneezing has improved Nonproductive Feels bad, tired, and washed out without energy. No chest congestion or nasal congestion. No sore throat.  Nose running clear, post nasal drip.  Lost her voice. Did not talk and rested. Now she is able to talk.  Still eating and drinking.  No headaches, no fevers, no chill.      Review of Systems:  Review of Systems  Constitutional: Negative for activity change, appetite change, fatigue and unexpected weight change.  HENT: Negative for congestion, ear discharge, ear pain, facial swelling, hearing loss, postnasal drip, rhinorrhea, sinus pressure, sneezing, sore throat and trouble swallowing.   Eyes: Negative.   Respiratory: Positive for cough. Negative for chest tightness, shortness of breath and wheezing.   Cardiovascular: Negative for chest pain, palpitations and leg swelling.  Gastrointestinal: Negative for abdominal pain, diarrhea and constipation.  Musculoskeletal: Negative for myalgias.  Skin: Negative for color change and wound.  Neurological: Negative for dizziness, weakness, light-headedness and headaches.    Past Medical History  Diagnosis Date  . Atrial fibrillation   . Hypertension   . Hyperlipemia   . Mitral valve regurgitation   . Asthma   . Gout   . Arthritis   . Spondylosis of cervical spine   .  Diverticulitis   . History of CVA (cerebrovascular accident)     Left basal ganglia    Past Surgical History  Procedure Laterality Date  . Tonsillectomy  1938  . Thumb arthroscopy Right 2014    Dr. Burney Gauze  . Colonoscopy  2011    Dr. Cindie Laroche Jule Ser)   Social History:   reports that she quit smoking about 34 years ago. She has never used smokeless tobacco. She reports that she drinks about 0.6 oz of alcohol per week. She reports that she does not use illicit drugs.  Family History  Problem Relation Age of Onset  . Alzheimer's disease Mother   . Heart disease Father   . Heart disease Brother     Medications: Patient's Medications  New Prescriptions   No medications on file  Previous Medications   ALBUTEROL (PROVENTIL HFA;VENTOLIN HFA) 108 (90 BASE) MCG/ACT INHALER    Inhale 2 puffs into the lungs every 6 (six) hours as needed for wheezing or shortness of breath.   APIXABAN (ELIQUIS) 5 MG TABS TABLET    One twice daily for anticoagulation   CHERRY CONCENTRATE PO    Take 2 capsules by mouth every evening. "Dark Cherry"   CHOLECALCIFEROL (VITAMIN D) 1000 UNITS TABLET    Take by mouth every morning. Take 2,000 units daily   DILTIAZEM (CARDIZEM CD) 120 MG 24 HR CAPSULE    Take 120 mg by mouth every morning.    DIPHENHYDRAMINE (BENADRYL) 25  MG TABLET    Take 25 mg by mouth. Take one at bedtime   FESOTERODINE (TOVIAZ) 8 MG TB24 TABLET    Take 1 tablet (8 mg total) by mouth daily.   HYDROCHLOROTHIAZIDE (HYDRODIURIL) 25 MG TABLET    Take 1 tablet (25 mg total) by mouth daily.   LOSARTAN (COZAAR) 100 MG TABLET    Take one tablet by mouth once daily to control blood pressure   MULTIPLE VITAMINS-MINERALS (CENTRUM SILVER ADULT 50+ PO)    Take 1 tablet by mouth every morning.    OMEGA-3 FATTY ACIDS (FISH OIL) 1000 MG CAPS    Take 2 capsules by mouth every morning.   Modified Medications   No medications on file  Discontinued Medications   No medications on file     Physical  Exam:  Filed Vitals:   04/05/14 1014  BP: 122/76  Pulse: 92  Temp: 97.3 F (36.3 C)  TempSrc: Oral  Weight: 171 lb (77.565 kg)  SpO2: 94%    Physical Exam  Constitutional: She is oriented to person, place, and time. She appears well-developed and well-nourished. No distress.  HENT:  Head: Normocephalic and atraumatic.  Right Ear: External ear normal.  Nose: Rhinorrhea present.  Mouth/Throat: Uvula is midline, oropharynx is clear and moist and mucous membranes are normal. No oropharyngeal exudate.  Neck: Normal range of motion. Neck supple.  Cardiovascular: Normal rate and normal heart sounds.  An irregular rhythm present.  Pulmonary/Chest: Effort normal and breath sounds normal. No respiratory distress. She has no wheezes.  Abdominal: Soft. Bowel sounds are normal.  Lymphadenopathy:    She has no cervical adenopathy.  Neurological: She is alert and oriented to person, place, and time.  Skin: Skin is warm and dry. She is not diaphoretic.    Labs reviewed: Basic Metabolic Panel:  Recent Labs  10/19/13  NA 141  K 4.1  BUN 19  CREATININE 0.8  TSH 1.70   Liver Function Tests:  Recent Labs  10/19/13  AST 28  ALT 26  ALKPHOS 68   No results for input(s): LIPASE, AMYLASE in the last 8760 hours. No results for input(s): AMMONIA in the last 8760 hours. CBC:  Recent Labs  08/06/13 1329 10/19/13  WBC 6.3 4.7  NEUTROABS 4.6  --   HGB 13.6 14.3  HCT 40.2 41  MCV 93.5  --   PLT 221 247   Lipid Panel:  Recent Labs  10/19/13  CHOL 192  HDL 56  LDLCALC 115  TRIG 103   TSH:  Recent Labs  10/19/13  TSH 1.70   A1C: No results found for: HGBA1C   Assessment/Plan 1. Laryngitis Overall appears to be improving, unsure if the fatigue and malaise is from laryngitis or due to benadryl use. Instructed to STOP benadryl at this time. May use delsym every 12 hours as needed for cough. Cough drops okay as needed -to increase hydration -may use Claritin 10 mg daily  as needed for sneezing and rhinorrhea - to follow up next week if symptoms unchanged, sooner if she becomes worse. Pt understands

## 2014-05-01 LAB — LIPID PANEL
CHOLESTEROL: 170 mg/dL (ref 0–200)
HDL: 55 mg/dL (ref 35–70)
LDL Cholesterol: 96 mg/dL
LDL/HDL RATIO: 3.1
Triglycerides: 95 mg/dL (ref 40–160)

## 2014-05-01 LAB — BASIC METABOLIC PANEL
BUN: 15 mg/dL (ref 4–21)
Creatinine: 0.8 mg/dL (ref 0.5–1.1)
Glucose: 90 mg/dL
POTASSIUM: 3.8 mmol/L (ref 3.4–5.3)
Sodium: 142 mmol/L (ref 137–147)

## 2014-05-07 ENCOUNTER — Other Ambulatory Visit: Payer: Self-pay | Admitting: Internal Medicine

## 2014-05-08 ENCOUNTER — Other Ambulatory Visit: Payer: Self-pay

## 2014-05-10 ENCOUNTER — Encounter: Payer: Self-pay | Admitting: Internal Medicine

## 2014-05-10 ENCOUNTER — Non-Acute Institutional Stay: Payer: Medicare Other | Admitting: Internal Medicine

## 2014-05-10 VITALS — BP 116/60 | HR 80 | Temp 97.0°F | Wt 171.0 lb

## 2014-05-10 DIAGNOSIS — G4700 Insomnia, unspecified: Secondary | ICD-10-CM | POA: Diagnosis not present

## 2014-05-10 DIAGNOSIS — L600 Ingrowing nail: Secondary | ICD-10-CM | POA: Diagnosis not present

## 2014-05-10 DIAGNOSIS — I482 Chronic atrial fibrillation, unspecified: Secondary | ICD-10-CM

## 2014-05-10 DIAGNOSIS — M11861 Other specified crystal arthropathies, right knee: Secondary | ICD-10-CM | POA: Diagnosis not present

## 2014-05-10 DIAGNOSIS — M11261 Other chondrocalcinosis, right knee: Secondary | ICD-10-CM

## 2014-05-10 DIAGNOSIS — E785 Hyperlipidemia, unspecified: Secondary | ICD-10-CM | POA: Diagnosis not present

## 2014-05-10 DIAGNOSIS — I10 Essential (primary) hypertension: Secondary | ICD-10-CM

## 2014-05-10 DIAGNOSIS — I872 Venous insufficiency (chronic) (peripheral): Secondary | ICD-10-CM

## 2014-05-10 MED ORDER — SUVOREXANT 15 MG PO TABS: 15.0000 mg | ORAL_TABLET | Freq: Every evening | ORAL | Status: DC | PRN

## 2014-05-10 NOTE — Progress Notes (Signed)
Patient ID: Diana Petersen, female   DOB: [birth year], 81 y.o.   MRN: [id number]     [facility] (12[facility]    Allergies  Allergen Reactions  . Codeine Nausea And Vomiting  . Penicillins Hives and Rash  . Sulfa Antibiotics Hives and Rash    Chief Complaint  Patient presents with  . Medical Management of Chronic Issues    blood pressure, A-Fib, edema  . calcium    should she take calcium    HPI:   Says she is trying to lose weight, but nothing seems to help.   Essential hypertension: controlled  Hyperlipidemia: controlled  Pseudogout of knee, right: persistent mild discomfort  Chronic a-fib: seems to be in NSR today  Venous (peripheral) insufficiency: associated with mild pedal edema  Ingrowing right great toenail: resolved    Medications: Patient's Medications  New Prescriptions   No medications on file  Previous Medications   ALBUTEROL (PROVENTIL HFA;VENTOLIN HFA) 108 (90 BASE) MCG/ACT INHALER    Inhale 2 puffs into the lungs every 6 (six) hours as needed for wheezing or shortness of breath.   APIXABAN (ELIQUIS) 5 MG TABS TABLET    One twice daily for anticoagulation   CHERRY CONCENTRATE PO    Take 2 capsules by mouth every evening. "Dark Cherry" for knee pain   CHOLECALCIFEROL (VITAMIN D) 1000 UNITS TABLET    Take by mouth every morning. Take 2,000 units daily   DILTIAZEM (CARDIZEM CD) 120 MG 24 HR CAPSULE    Take 120 mg by mouth every morning.    FESOTERODINE (TOVIAZ) 8 MG TB24 TABLET    Take 1 tablet (8 mg total) by mouth daily.   HYDROCHLOROTHIAZIDE (HYDRODIURIL) 25 MG TABLET    Take 1 tablet (25 mg total) by mouth daily.   LOSARTAN (COZAAR) 100 MG TABLET    TAKE 1 TABLET DAILY TO CONTROL BLOOD PRESSURE.   MULTIPLE VITAMINS-MINERALS (CENTRUM SILVER ADULT 50+ PO)    Take 1 tablet by mouth every morning.    OMEGA-3 FATTY ACIDS (FISH OIL) 1000 MG CAPS    Take 2 capsules by mouth every morning.   Modified Medications   No  medications on file  Discontinued Medications   DIPHENHYDRAMINE (BENADRYL) 25 MG TABLET    Take 25 mg by mouth. Take one at bedtime     Review of Systems  Constitutional: Negative for activity change, appetite change, fatigue and unexpected weight change.  HENT: Negative for congestion, ear discharge, ear pain, facial swelling, hearing loss, postnasal drip, rhinorrhea, sinus pressure, sneezing, sore throat and trouble swallowing.   Eyes: Negative.   Respiratory: Negative for cough, chest tightness, shortness of breath and wheezing.   Cardiovascular: Negative for chest pain, palpitations and leg swelling.  Gastrointestinal: Negative for abdominal pain, diarrhea and constipation.  Musculoskeletal: Negative for myalgias.  Skin: Negative for color change and wound.  Neurological: Negative for dizziness, weakness, light-headedness and headaches.    Filed Vitals:   05/10/14 1348  BP: 116/60  Pulse: 80  Temp: 97 F (36.1 C)  TempSrc: Oral  Weight: 171 lb (77.565 kg)   Body mass index is 31.27 kg/(m^2).  Physical Exam  Constitutional: She is oriented to person, place, and time. She appears well-developed and well-nourished. No distress.  HENT:  Right Ear: External ear normal.  Left Ear: External ear normal.  Nose: Nose normal.  Mouth/Throat: Oropharynx is clear and moist. No oropharyngeal exudate.  Eyes: Conjunctivae and EOM are  normal. Pupils are equal, round, and reactive to light. No scleral icterus.  Neck: No JVD present. No tracheal deviation present. No thyromegaly present.  Cardiovascular: Normal rate, normal heart sounds and intact distal pulses.  Exam reveals no gallop and no friction rub.   No murmur heard. AF  Pulmonary/Chest: Effort normal. No respiratory distress. She has no wheezes. She has no rales. She exhibits no tenderness.  Abdominal: She exhibits no distension and no mass. There is no tenderness.  Musculoskeletal: Normal range of motion. She exhibits no edema or  tenderness.  Lymphadenopathy:    She has no cervical adenopathy.  Neurological: She is alert and oriented to person, place, and time. No cranial nerve deficit. Coordination normal.  Skin: No rash noted. She is not diaphoretic. No erythema. No pallor.  Psychiatric: She has a normal mood and affect. Her behavior is normal. Judgment and thought content normal.     Labs reviewed: Lab on 05/08/2014  Component Date Value Ref Range Status  . Glucose 05/01/2014 90   Final  . BUN 05/01/2014 15  4 - 21 mg/dL Final  . Creatinine 05/01/2014 0.8  .5 - 1.1 mg/dL Final  . Potassium 05/01/2014 3.8  3.4 - 5.3 mmol/L Final  . Sodium 05/01/2014 142  137 - 147 mmol/L Final  . LDl/HDL Ratio 05/01/2014 3.1   Final  . Triglycerides 05/01/2014 95  40 - 160 mg/dL Final  . Cholesterol 05/01/2014 170  0 - 200 mg/dL Final  . HDL 05/01/2014 55  35 - 70 mg/dL Final  . LDL Cholesterol 05/01/2014 96   Final     Assessment/Plan  1. Essential hypertension controlled  2. Hyperlipidemia controlled  3. Pseudogout of knee, right Chronic condition which is intermittently painful  4. Chronic a-fib Appears to be in normal sinus rhythm today  5. Venous (peripheral) insufficiency Mild pedal edema associated with this  6. Ingrowing right great toenail Resolved  7. Insomnia Try Belsomra, 50 mg at bedtime

## 2014-05-16 ENCOUNTER — Telehealth: Payer: Self-pay | Admitting: *Deleted

## 2014-05-16 NOTE — Telephone Encounter (Signed)
Diana Petersen, call regarding a prescription that was given to her on 05/10/2014 visit with Dr. Nyoka Cowden, she stated that [facility] didn't receive the script. I called the script to the pharmacy and LMOM at the patient residence.

## 2014-05-17 ENCOUNTER — Non-Acute Institutional Stay: Payer: Medicare Other | Admitting: Nurse Practitioner

## 2014-05-17 ENCOUNTER — Encounter: Payer: Self-pay | Admitting: Nurse Practitioner

## 2014-05-17 VITALS — BP 104/62 | HR 72 | Temp 97.4°F | Wt 168.0 lb

## 2014-05-17 DIAGNOSIS — I482 Chronic atrial fibrillation, unspecified: Secondary | ICD-10-CM

## 2014-05-17 DIAGNOSIS — G4700 Insomnia, unspecified: Secondary | ICD-10-CM | POA: Diagnosis not present

## 2014-05-17 DIAGNOSIS — N3281 Overactive bladder: Secondary | ICD-10-CM | POA: Diagnosis not present

## 2014-05-17 DIAGNOSIS — M11261 Other chondrocalcinosis, right knee: Secondary | ICD-10-CM

## 2014-05-17 DIAGNOSIS — M11861 Other specified crystal arthropathies, right knee: Secondary | ICD-10-CM | POA: Diagnosis not present

## 2014-05-17 DIAGNOSIS — I10 Essential (primary) hypertension: Secondary | ICD-10-CM

## 2014-05-17 NOTE — Progress Notes (Signed)
Patient ID: Diana Petersen, female   DOB: 1929/03/22, 81 y.o.   MRN: [id number]   Code Status: not on file  Allergies  Allergen Reactions  . Codeine Nausea And Vomiting  . Penicillins Hives and Rash  . Sulfa Antibiotics Hives and Rash    Chief Complaint  Patient presents with  . trim nail    on large and second toe on right foot. Better, not as red    HPI: Patient is a 81 y.o. female seen in the clinic at [facility] today for chronic medical conditions.  Problem List Items Addressed This Visit    HTN (hypertension) (Chronic)    Blood pressure is controlled. Takes Diltiazem SR 120mg , HCTZ 25mg , and Losartan 100mg  daily.          Chronic a-fib - Primary (Chronic)    Rate controlled, takes Diltiazem SR 24hr 120mg  daily. Eliquis for thromboembolic risk reduction.         Pseudogout of knee (Chronic)    No longer problematic.       Detrusor muscle hypertonia    Managed with Toviaz 8mg  daily. X2/night.         Insomnia    Will try Suvorexant-stated she wakes up around 3-4 am and has difficulty returning to asleep.          Review of Systems:  Review of Systems  Constitutional: Negative for fever, chills and diaphoresis.  HENT: Positive for hearing loss. Negative for congestion, ear discharge, ear pain, nosebleeds, sore throat and tinnitus.   Eyes: Negative for photophobia, pain, discharge and redness.  Respiratory: Negative for cough, shortness of breath, wheezing and stridor.   Cardiovascular: Positive for leg swelling. Negative for chest pain and palpitations.  Gastrointestinal: Negative for nausea, vomiting, abdominal pain, diarrhea, constipation and blood in stool.  Endocrine: Negative for polydipsia.  Genitourinary: Positive for frequency. Negative for dysuria, urgency, hematuria and flank pain.  Musculoskeletal: Negative for myalgias, back pain and neck pain.  Skin: Negative for rash.       Medial right great toenail trimmed.     Allergic/Immunologic: Negative for environmental allergies.  Neurological: Negative for dizziness, tremors, seizures, weakness and headaches.  Hematological: Bruises/bleeds easily.  Psychiatric/Behavioral: Negative for suicidal ideas and hallucinations. The patient is not nervous/anxious.       Past Medical History  Diagnosis Date  . Atrial fibrillation   . Hypertension   . Hyperlipemia   . Mitral valve regurgitation   . Asthma   . Gout   . Arthritis   . Spondylosis of cervical spine   . Diverticulitis   . History of CVA (cerebrovascular accident)     Left basal ganglia    Past Surgical History  Procedure Laterality Date  . Tonsillectomy  1938  . Thumb arthroscopy Right 2014    Dr. Burney Gauze  . Colonoscopy  2011    Dr. Cindie Laroche Jule Ser)   Social History:   reports that she quit smoking about 34 years ago. She has never used smokeless tobacco. She reports that she drinks about 0.6 oz of alcohol per week. She reports that she does not use illicit drugs.  Family History  Problem Relation Age of Onset  . Alzheimer's disease Mother   . Heart disease Father   . Heart disease Brother     Medications: Patient's Medications  New Prescriptions   No medications on file  Previous Medications   ALBUTEROL (PROVENTIL HFA;VENTOLIN HFA) 108 (90 BASE) MCG/ACT INHALER    Inhale 2 puffs into  the lungs every 6 (six) hours as needed for wheezing or shortness of breath.   APIXABAN (ELIQUIS) 5 MG TABS TABLET    One twice daily for anticoagulation   CHERRY CONCENTRATE PO    Take 2 capsules by mouth every evening. "Dark Cherry" for knee pain   CHOLECALCIFEROL (VITAMIN D) 1000 UNITS TABLET    Take by mouth every morning. Take 2,000 units daily   DILTIAZEM (CARDIZEM CD) 120 MG 24 HR CAPSULE    Take 120 mg by mouth every morning.    FESOTERODINE (TOVIAZ) 8 MG TB24 TABLET    Take 1 tablet (8 mg total) by mouth daily.   HYDROCHLOROTHIAZIDE (MICROZIDE) 12.5 MG CAPSULE       LOSARTAN  (COZAAR) 100 MG TABLET    TAKE 1 TABLET DAILY TO CONTROL BLOOD PRESSURE.   MULTIPLE VITAMINS-MINERALS (CENTRUM SILVER ADULT 50+ PO)    Take 1 tablet by mouth every morning.    OMEGA-3 FATTY ACIDS (FISH OIL) 1000 MG CAPS    Take 1 capsule by mouth. Twice daily   SUVOREXANT (BELSOMRA) 15 MG TABS    Take 15 mg by mouth at bedtime as needed.  Modified Medications   No medications on file  Discontinued Medications   No medications on file     Physical Exam: Physical Exam  Constitutional: She is oriented to person, place, and time. She appears well-developed and well-nourished. No distress.  HENT:  Head: Normocephalic and atraumatic.  Right Ear: External ear normal.  Left Ear: External ear normal.  Nose: Nose normal.  Mouth/Throat: Oropharynx is clear and moist. No oropharyngeal exudate.  Eyes: Conjunctivae and EOM are normal. Pupils are equal, round, and reactive to light. Right eye exhibits no discharge. Left eye exhibits no discharge. No scleral icterus.  Neck: Normal range of motion. Neck supple. No JVD present. No tracheal deviation present. No thyromegaly present.  Cardiovascular: Normal rate, normal heart sounds and intact distal pulses.  An irregular rhythm present.  No murmur heard. Pulmonary/Chest: Effort normal and breath sounds normal. No stridor. No respiratory distress. She has no wheezes. She has no rales. She exhibits no tenderness.  Abdominal: Soft. Bowel sounds are normal. She exhibits no distension and no mass. There is no tenderness. There is no rebound and no guarding.  Genitourinary: Guaiac negative stool.  Musculoskeletal: Normal range of motion. She exhibits edema and tenderness.  BLE trace edema. Varicose veins.  Lymphadenopathy:    She has no cervical adenopathy.  Neurological: She is alert and oriented to person, place, and time. She has normal reflexes. No cranial nerve deficit. She exhibits normal muscle tone. Coordination normal.  Skin: Skin is warm and dry. No  rash noted. She is not diaphoretic. No erythema. No pallor.  Right great ingrowing toenail trimmed.   Psychiatric: She has a normal mood and affect. Her behavior is normal. Judgment and thought content normal.    Filed Vitals:   05/17/14 1456  BP: 104/62  Pulse: 72  Temp: 97.4 F (36.3 C)  TempSrc: Oral  Weight: 168 lb (76.204 kg)      Labs reviewed: Basic Metabolic Panel:  Recent Labs  10/19/13 05/01/14  NA 141 142  K 4.1 3.8  BUN 19 15  CREATININE 0.8 0.8  TSH 1.70  --    Liver Function Tests:  Recent Labs  10/19/13  AST 28  ALT 26  ALKPHOS 68   No results for input(s): LIPASE, AMYLASE in the last 8760 hours. No results for input(s): AMMONIA in  the last 8760 hours. CBC:  Recent Labs  08/06/13 1329 10/19/13  WBC 6.3 4.7  NEUTROABS 4.6  --   HGB 13.6 14.3  HCT 40.2 41  MCV 93.5  --   PLT 221 247   Lipid Panel:  Recent Labs  10/19/13 05/01/14  CHOL 192 170  HDL 56 55  LDLCALC 115 96  TRIG 103 95   Anemia Panel: No results for input(s): FOLATE, IRON, VITAMINB12 in the last 8760 hours.  Past Procedures:  09/13/12 mammogram:  IMPRESSION:  Bilateral benign breast calcifications, compatible with small  degenerated fibroadenomas. No evidence of malignancy.   02/21/13 X-ray left wrist:  IMPRESSION:  There is no evidence of an acute fracture nor dislocation of the  bones of the left wrist. There are mild degenerative changes of the  1st carpometacarpal joint.  02/21/13 CT head w/o CM:  IMPRESSION:  Subcutaneous hematoma left frontal/ supraorbital/ pre orbital region  without underlying fracture or intracranial hemorrhage.  Small vessel disease type changes without CT evidence of large acute  infarct.  Global atrophy without hydrocephalus.   06/16/13 CXR No active disease.    Assessment/Plan Chronic a-fib Rate controlled, takes Diltiazem SR 24hr 120mg  daily. Eliquis for thromboembolic risk reduction.      Insomnia Will try  Suvorexant-stated she wakes up around 3-4 am and has difficulty returning to asleep.    HTN (hypertension) Blood pressure is controlled. Takes Diltiazem SR 120mg , HCTZ 25mg , and Losartan 100mg  daily.       Detrusor muscle hypertonia Managed with Toviaz 8mg  daily. X2/night.      Pseudogout of knee No longer problematic.      Family/ Staff Communication: observe the patient.   Goals of Care: [place]  Labs/tests ordered: none

## 2014-05-17 NOTE — Assessment & Plan Note (Signed)
Managed with Toviaz 8mg  daily. X2/night.

## 2014-05-17 NOTE — Assessment & Plan Note (Signed)
Blood pressure is controlled. Takes Diltiazem SR 120mg , HCTZ 25mg , and Losartan 100mg  daily.

## 2014-05-17 NOTE — Assessment & Plan Note (Addendum)
Will try Suvorexant-stated she wakes up around 3-4 am and has difficulty returning to asleep.

## 2014-05-17 NOTE — Assessment & Plan Note (Signed)
Rate controlled, takes Diltiazem SR 24hr 120mg  daily. Eliquis for thromboembolic risk reduction.

## 2014-05-17 NOTE — Assessment & Plan Note (Signed)
No longer problematic.

## 2014-05-25 ENCOUNTER — Other Ambulatory Visit: Payer: Self-pay | Admitting: Internal Medicine

## 2014-06-12 ENCOUNTER — Other Ambulatory Visit: Payer: Self-pay | Admitting: Internal Medicine

## 2014-07-12 ENCOUNTER — Non-Acute Institutional Stay: Payer: Medicare Other | Admitting: Nurse Practitioner

## 2014-07-12 ENCOUNTER — Other Ambulatory Visit: Payer: Self-pay | Admitting: Nurse Practitioner

## 2014-07-12 ENCOUNTER — Encounter: Payer: Self-pay | Admitting: Nurse Practitioner

## 2014-07-12 VITALS — BP 132/78 | HR 96 | Temp 98.4°F | Wt 168.0 lb

## 2014-07-12 DIAGNOSIS — I10 Essential (primary) hypertension: Secondary | ICD-10-CM | POA: Diagnosis not present

## 2014-07-12 DIAGNOSIS — I482 Chronic atrial fibrillation, unspecified: Secondary | ICD-10-CM

## 2014-07-12 DIAGNOSIS — J208 Acute bronchitis due to other specified organisms: Secondary | ICD-10-CM

## 2014-07-12 DIAGNOSIS — N3281 Overactive bladder: Secondary | ICD-10-CM

## 2014-07-12 DIAGNOSIS — J209 Acute bronchitis, unspecified: Secondary | ICD-10-CM | POA: Insufficient documentation

## 2014-07-12 MED ORDER — ALBUTEROL SULFATE HFA 108 (90 BASE) MCG/ACT IN AERS: 1.0000 | INHALATION_SPRAY | Freq: Four times a day (QID) | RESPIRATORY_TRACT | Status: DC | PRN

## 2014-07-12 MED ORDER — LEVOFLOXACIN 500 MG PO TABS: ORAL_TABLET | ORAL | Status: DC

## 2014-07-12 MED ORDER — GUAIFENESIN ER 600 MG PO TB12: ORAL_TABLET | ORAL | Status: DC

## 2014-07-12 MED ORDER — METHYLPREDNISOLONE 8 MG PO TABS: ORAL_TABLET | ORAL | Status: DC

## 2014-07-12 NOTE — Assessment & Plan Note (Signed)
Congestive cough x 2 days, denied chest pain and sputum production, Levaquin 500mg  daily x 7 days, Medrol dose pk, Proventil prn, Mucinex 500mg  bid x 5 days. Observe the patient.

## 2014-07-12 NOTE — Assessment & Plan Note (Signed)
Failed Suvorexant-unable to tolerate.

## 2014-07-12 NOTE — Assessment & Plan Note (Signed)
Rate controlled, takes Diltiazem SR 24hr 120mg  daily. Eliquis for thromboembolic risk reduction.

## 2014-07-12 NOTE — Assessment & Plan Note (Signed)
Managed with Toviaz 8mg  daily. X2/night.

## 2014-07-12 NOTE — Progress Notes (Signed)
Patient ID: Diana Petersen, female   DOB: January 09, 1930, 81 y.o.   MRN: [id number]   Code Status: not on file  Allergies  Allergen Reactions  . Codeine Nausea And Vomiting  . Penicillins Hives and Rash  . Sulfa Antibiotics Hives and Rash    Chief Complaint  Patient presents with  . Cough    cold for 2 days, last night started wheezing. Her Proair inhaler had expired. Her oxygen levell was low, [facility] clinic nurse gave her O2 1.5    HPI: Patient is a 81 y.o. female seen in the clinic at [facility] today for congestive cough and chronic medical conditions.  Problem List Items Addressed This Visit    None      Review of Systems:  Review of Systems  Constitutional: Negative for fever, chills and diaphoresis.  HENT: Positive for hearing loss. Negative for congestion, ear discharge, ear pain, nosebleeds, sore throat and tinnitus.   Eyes: Negative for photophobia, pain, discharge and redness.  Respiratory: Positive for cough and wheezing. Negative for shortness of breath and stridor.   Cardiovascular: Positive for leg swelling. Negative for chest pain and palpitations.  Gastrointestinal: Negative for nausea, vomiting, abdominal pain, diarrhea, constipation and blood in stool.  Endocrine: Negative for polydipsia.  Genitourinary: Positive for frequency. Negative for dysuria, urgency, hematuria and flank pain.  Musculoskeletal: Negative for myalgias, back pain and neck pain.  Skin: Negative for rash.       Medial right great toenail trimmed.   Allergic/Immunologic: Negative for environmental allergies.  Neurological: Negative for dizziness, tremors, seizures, weakness and headaches.  Hematological: Bruises/bleeds easily.  Psychiatric/Behavioral: Negative for suicidal ideas and hallucinations. The patient is not nervous/anxious.       Past Medical History  Diagnosis Date  . Atrial fibrillation   . Hypertension   . Hyperlipemia   . Mitral valve regurgitation   . Asthma   .  Gout   . Arthritis   . Spondylosis of cervical spine   . Diverticulitis   . History of CVA (cerebrovascular accident)     Left basal ganglia    Past Surgical History  Procedure Laterality Date  . Tonsillectomy  1938  . Thumb arthroscopy Right 2014    Dr. Burney Gauze  . Colonoscopy  2011    Dr. Cindie Laroche Jule Ser)   Social History:   reports that she quit smoking about 34 years ago. She has never used smokeless tobacco. She reports that she drinks about 0.6 oz of alcohol per week. She reports that she does not use illicit drugs.  Family History  Problem Relation Age of Onset  . Alzheimer's disease Mother   . Heart disease Father   . Heart disease Brother     Medications: Patient's Medications  New Prescriptions   No medications on file  Previous Medications   ALBUTEROL (PROVENTIL HFA;VENTOLIN HFA) 108 (90 BASE) MCG/ACT INHALER    Inhale 2 puffs into the lungs every 6 (six) hours as needed for wheezing or shortness of breath.   APIXABAN (ELIQUIS) 5 MG TABS TABLET    One twice daily for anticoagulation   CHERRY CONCENTRATE PO    Take 2 capsules by mouth every evening. "Dark Cherry" for knee pain   CHOLECALCIFEROL (VITAMIN D) 1000 UNITS TABLET    Take by mouth every morning. Take 2,000 units daily   DILTIAZEM (CARDIZEM CD) 120 MG 24 HR CAPSULE    TAKE 1 CAPSULE IN THE MORNING ON AN EMPTY STOMACH.   HYDROCHLOROTHIAZIDE (MICROZIDE) 12.5 MG  CAPSULE       LOSARTAN (COZAAR) 100 MG TABLET    TAKE 1 TABLET DAILY TO CONTROL BLOOD PRESSURE.   MULTIPLE VITAMINS-MINERALS (CENTRUM SILVER ADULT 50+ PO)    Take 1 tablet by mouth every morning.    OMEGA-3 FATTY ACIDS (FISH OIL) 1000 MG CAPS    Take 1 capsule by mouth. Twice daily   TOVIAZ 8 MG TB24 TABLET    TAKE 1 TABLET ONCE DAILY.  Modified Medications   No medications on file  Discontinued Medications   SUVOREXANT (BELSOMRA) 15 MG TABS    Take 15 mg by mouth at bedtime as needed.     Physical Exam: Physical Exam  Constitutional:  She is oriented to person, place, and time. She appears well-developed and well-nourished. No distress.  HENT:  Head: Normocephalic and atraumatic.  Right Ear: External ear normal.  Left Ear: External ear normal.  Nose: Nose normal.  Mouth/Throat: Oropharynx is clear and moist. No oropharyngeal exudate.  Eyes: Conjunctivae and EOM are normal. Pupils are equal, round, and reactive to light. Right eye exhibits no discharge. Left eye exhibits no discharge. No scleral icterus.  Neck: Normal range of motion. Neck supple. No JVD present. No tracheal deviation present. No thyromegaly present.  Cardiovascular: Normal rate, normal heart sounds and intact distal pulses.  An irregular rhythm present.  No murmur heard. Pulmonary/Chest: Effort normal. No stridor. No respiratory distress. She has wheezes. She has rales. She exhibits no tenderness.  Abdominal: Soft. Bowel sounds are normal. She exhibits no distension and no mass. There is no tenderness. There is no rebound and no guarding.  Genitourinary: Guaiac negative stool.  Musculoskeletal: Normal range of motion. She exhibits edema and tenderness.  BLE trace edema. Varicose veins.  Lymphadenopathy:    She has no cervical adenopathy.  Neurological: She is alert and oriented to person, place, and time. She has normal reflexes. No cranial nerve deficit. She exhibits normal muscle tone. Coordination normal.  Skin: Skin is warm and dry. No rash noted. She is not diaphoretic. No erythema. No pallor.  Right great ingrowing toenail trimmed.   Psychiatric: She has a normal mood and affect. Her behavior is normal. Judgment and thought content normal.    Filed Vitals:   07/12/14 1443  BP: 132/78  Pulse: 96  Temp: 98.4 F (36.9 C)  TempSrc: Oral  Weight: 168 lb (76.204 kg)  SpO2: 93%      Labs reviewed: Basic Metabolic Panel:  Recent Labs  10/19/13 05/01/14  NA 141 142  K 4.1 3.8  BUN 19 15  CREATININE 0.8 0.8  TSH 1.70  --    Liver  Function Tests:  Recent Labs  10/19/13  AST 28  ALT 26  ALKPHOS 68   No results for input(s): LIPASE, AMYLASE in the last 8760 hours. No results for input(s): AMMONIA in the last 8760 hours. CBC:  Recent Labs  08/06/13 1329 10/19/13  WBC 6.3 4.7  NEUTROABS 4.6  --   HGB 13.6 14.3  HCT 40.2 41  MCV 93.5  --   PLT 221 247   Lipid Panel:  Recent Labs  10/19/13 05/01/14  CHOL 192 170  HDL 56 55  LDLCALC 115 96  TRIG 103 95   Anemia Panel: No results for input(s): FOLATE, IRON, VITAMINB12 in the last 8760 hours.  Past Procedures:  09/13/12 mammogram:  IMPRESSION:  Bilateral benign breast calcifications, compatible with small  degenerated fibroadenomas. No evidence of malignancy.   02/21/13 X-ray left wrist:  IMPRESSION:  There is no evidence of an acute fracture nor dislocation of the  bones of the left wrist. There are mild degenerative changes of the  1st carpometacarpal joint.  02/21/13 CT head w/o CM:  IMPRESSION:  Subcutaneous hematoma left frontal/ supraorbital/ pre orbital region  without underlying fracture or intracranial hemorrhage.  Small vessel disease type changes without CT evidence of large acute  infarct.  Global atrophy without hydrocephalus.   06/16/13 CXR No active disease.    Assessment/Plan No problem-specific assessment & plan notes found for this encounter.   Family/ Staff Communication: observe the patient.   Goals of Care: IL  Labs/tests ordered: none

## 2014-07-12 NOTE — Assessment & Plan Note (Signed)
Blood pressure is controlled. Takes Diltiazem SR 120mg , HCTZ 25mg , and Losartan 100mg  daily.

## 2014-10-04 LAB — HM DIABETES EYE EXAM

## 2014-10-08 ENCOUNTER — Encounter: Payer: Self-pay | Admitting: *Deleted

## 2014-11-06 ENCOUNTER — Other Ambulatory Visit: Payer: Self-pay | Admitting: Internal Medicine

## 2014-11-08 ENCOUNTER — Encounter: Payer: Self-pay | Admitting: Internal Medicine

## 2014-12-06 ENCOUNTER — Other Ambulatory Visit: Payer: Self-pay | Admitting: Internal Medicine

## 2014-12-18 ENCOUNTER — Other Ambulatory Visit: Payer: Self-pay | Admitting: Internal Medicine

## 2015-01-07 ENCOUNTER — Other Ambulatory Visit: Payer: Self-pay | Admitting: Internal Medicine

## 2015-01-17 ENCOUNTER — Encounter: Payer: Self-pay | Admitting: Internal Medicine

## 2015-01-17 ENCOUNTER — Non-Acute Institutional Stay: Payer: Medicare Other | Admitting: Internal Medicine

## 2015-01-17 VITALS — BP 124/72 | HR 68 | Temp 97.5°F | Ht 62.0 in | Wt 164.0 lb

## 2015-01-17 DIAGNOSIS — R197 Diarrhea, unspecified: Secondary | ICD-10-CM | POA: Diagnosis not present

## 2015-01-17 DIAGNOSIS — I10 Essential (primary) hypertension: Secondary | ICD-10-CM | POA: Diagnosis not present

## 2015-01-17 DIAGNOSIS — I482 Chronic atrial fibrillation, unspecified: Secondary | ICD-10-CM

## 2015-01-17 NOTE — Progress Notes (Signed)
Patient ID: Diana Petersen, female   DOB: 08/03/1929, 81 y.o.   MRN: [id number]    [facility]    Place of Service: Clinic (12) OFFICE    Allergies  Allergen Reactions  . Codeine Nausea And Vomiting  . Penicillins Hives and Rash  . Sulfa Antibiotics Hives and Rash    Chief Complaint  Patient presents with  . Medical Management of Chronic Issues    A-Fib, blood pressure, insomnia.   . Diarrhea    Saturday evenig diarrhea had 8 BM that evening. Stopped after taking Imodium, no BM since    HPI:  Chronic a-fib ([place]) - seems to be in NSR today  Essential hypertension - controlled  Diarrhea, unspecified type - on 01/12/15, she had sudden onset of multiple loose stools with watery diarrhea. Painless. No blood. usid Imodium and has not had a stool since the, 5 days ago. Had mild nausea with the diarrhea. No fever.Denies abd pain, bloating, or gas.    Medications: Patient's Medications  New Prescriptions   No medications on file  Previous Medications   ALBUTEROL (PROVENTIL HFA;VENTOLIN HFA) 108 (90 BASE) MCG/ACT INHALER    Inhale 1-2 puffs into the lungs every 6 (six) hours as needed for wheezing or shortness of breath. Every 6 to 8 hours as needed for wheezing   APIXABAN (ELIQUIS) 5 MG TABS TABLET    One twice daily for anticoagulation   CALCIUM CARBONATE (CALCIUM 600 PO)    Take by mouth. Take one daily   CHERRY CONCENTRATE PO    Take 2 capsules by mouth every evening. "Dark Cherry" for knee pain   DILTIAZEM (CARDIZEM CD) 120 MG 24 HR CAPSULE    TAKE 1 CAPSULE IN THE MORNING ON AN EMPTY STOMACH.   HYDROCHLOROTHIAZIDE (MICROZIDE) 12.5 MG CAPSULE    TAKE (1) CAPSULE DAILY.   LOSARTAN (COZAAR) 100 MG TABLET    TAKE 1 TABLET DAILY TO CONTROL BLOOD PRESSURE.   MULTIPLE VITAMINS-MINERALS (CENTRUM SILVER ADULT 50+ PO)    Take 1 tablet by mouth every morning.    OMEGA-3 FATTY ACIDS (FISH OIL) 1000 MG CAPS    Take 1 capsule by mouth. Twice daily   TOVIAZ 8 MG TB24  TABLET    TAKE 1 TABLET ONCE DAILY.  Modified Medications   No medications on file  Discontinued Medications   CHOLECALCIFEROL (VITAMIN D) 1000 UNITS TABLET    Take by mouth every morning. Take 2,000 units daily   FLUVIRIN SUSP       GUAIFENESIN (MUCINEX) 600 MG 12 HR TABLET    Take one tablet twice daily for 5 days   LEVOFLOXACIN (LEVAQUIN) 500 MG TABLET    Take one tablet daily for infection for 7 days   METHYLPREDNISOLONE (MEDROL) 8 MG TABLET    Take 4 tablets for 2 days, 3 tablets for 2 days, 2 tablets for 2 days, one tablet for 2 days.   MULTIPLE VITAMINS-MINERALS (CENTRUM SILVER PO)    Take 1 tablet by mouth.    Review of Systems  Constitutional: Negative for activity change, appetite change, fatigue and unexpected weight change.  HENT: Negative for congestion, ear discharge, ear pain, facial swelling, hearing loss, postnasal drip, rhinorrhea, sinus pressure, sneezing, sore throat and trouble swallowing.   Eyes: Negative.   Respiratory: Negative for cough, chest tightness, shortness of breath and wheezing.   Cardiovascular: Negative for chest pain, palpitations and leg swelling.  Gastrointestinal: Positive for diarrhea. Negative for abdominal pain and constipation.  Musculoskeletal:  Negative for myalgias.  Skin: Negative for color change and wound.  Neurological: Negative for dizziness, weakness, light-headedness and headaches.    Filed Vitals:   01/17/15 1615  BP: 124/72  Pulse: 68  Temp: 97.5 F (36.4 C)  TempSrc: Oral  Height: 5' 2"  (1.575 m)  Weight: 164 lb (74.39 kg)  SpO2: 97%   Body mass index is 29.99 kg/(m^2).  Physical Exam  Constitutional: She is oriented to person, place, and time. She appears well-developed and well-nourished. No distress.  HENT:  Right Ear: External ear normal.  Left Ear: External ear normal.  Nose: Nose normal.  Mouth/Throat: Oropharynx is clear and moist. No oropharyngeal exudate.  Eyes: Conjunctivae and EOM are normal. Pupils are  equal, round, and reactive to light. No scleral icterus.  Neck: No JVD present. No tracheal deviation present. No thyromegaly present.  Cardiovascular: Normal rate, normal heart sounds and intact distal pulses.  Exam reveals no gallop and no friction rub.   No murmur heard. AF  Pulmonary/Chest: Effort normal. No respiratory distress. She has no wheezes. She has no rales. She exhibits no tenderness.  Abdominal: She exhibits no distension and no mass. There is no tenderness.  Musculoskeletal: Normal range of motion. She exhibits no edema or tenderness.  Lymphadenopathy:    She has no cervical adenopathy.  Neurological: She is alert and oriented to person, place, and time. No cranial nerve deficit. Coordination normal.  Skin: No rash noted. She is not diaphoretic. No erythema. No pallor.  Psychiatric: She has a normal mood and affect. Her behavior is normal. Judgment and thought content normal.    Labs reviewed: Lab Summary Latest Ref Rng 05/01/2014 10/19/2013 08/06/2013  Hemoglobin 12.0 - 16.0 g/dL (None) 14.3 13.6  Hematocrit 36 - 46 % (None) 41 40.2  White count - (None) 4.7 6.3  Platelet count 150 - 399 K/L (None) 247 221  Sodium 137 - 147 mmol/L 142 141 (None)  Potassium 3.4 - 5.3 mmol/L 3.8 4.1 (None)  Calcium - (None) (None) (None)  Phosphorus - (None) (None) (None)  Creatinine 0.5 - 1.1 mg/dL 0.8 0.8 (None)  AST 13 - 35 U/L (None) 28 (None)  Alk Phos 25 - 125 U/L (None) 68 (None)  Bilirubin - (None) (None) (None)  Glucose - 90 85 (None)  Cholesterol 0 - 200 mg/dL 170 192 (None)  HDL cholesterol 35 - 70 mg/dL 55 56 (None)  Triglycerides 40 - 160 mg/dL 95 103 (None)  LDL Direct - (None) (None) (None)  LDL Calc - 96 115 (None)  Total protein - (None) (None) (None)  Albumin - (None) (None) (None)   Lab Results  Component Value Date   TSH 1.70 10/19/2013   Lab Results  Component Value Date   BUN 15 05/01/2014   No results found for: HGBA1C  Assessment/Plan  1. Chronic  a-fib (HCC) Seems to be in NSR today  2. Essential hypertension controlled  3. Diarrhea, unspecified type Resolved.  -Take Senokot 2 tablets nightly until stools resume. Call if no stools by 01/21/15.

## 2015-02-10 HISTORY — PX: OTHER SURGICAL HISTORY: SHX169

## 2015-02-11 ENCOUNTER — Other Ambulatory Visit: Payer: Self-pay | Admitting: Internal Medicine

## 2015-03-06 ENCOUNTER — Other Ambulatory Visit: Payer: Self-pay | Admitting: Internal Medicine

## 2015-03-28 ENCOUNTER — Telehealth: Payer: Self-pay | Admitting: *Deleted

## 2015-03-28 MED ORDER — OXYBUTYNIN CHLORIDE ER 5 MG PO TB24: ORAL_TABLET | ORAL | Status: DC

## 2015-03-28 NOTE — Telephone Encounter (Signed)
Received fax from [place] [phone] stating insurance needs prior authorization on Oxybutynin ER 5mg  twice daily. Called # [phone] and spoke [place]. Went into determination and response in 24-48 hours.  ID: [id number]

## 2015-03-28 NOTE — Telephone Encounter (Signed)
Received fax from [facility] stating patient needs alternative medicine for Toviaz 8mg . Insurance will no cover the [place] and wants Oxybutynin ER, Myrbetriq, or Vesicare.  Per Dr. Arn Medal ER 5mg  One twice daily to help bladder control.  Faxed to [facility] as fax requested. Patient notified and faxed to [facility]

## 2015-03-28 NOTE — Telephone Encounter (Signed)
Received fax from [place] stating patient's Oxybutynin 5mg  is APPROVED through 02/09/2016

## 2015-05-31 ENCOUNTER — Other Ambulatory Visit: Payer: Self-pay | Admitting: Internal Medicine

## 2015-05-31 LAB — LIPID PANEL
CHOL/HDL RATIO: 3.2
CHOLESTEROL: 196
HDL: 62
Ldl Cholesterol, Calc: 119
TRIGLYCERIDES: 75
VLDL Cholesterol, Calc: 15

## 2015-06-06 ENCOUNTER — Encounter: Payer: Self-pay | Admitting: Internal Medicine

## 2015-06-06 ENCOUNTER — Non-Acute Institutional Stay: Payer: Medicare Other | Admitting: Internal Medicine

## 2015-06-06 VITALS — BP 104/62 | HR 58 | Temp 97.7°F | Ht 61.0 in | Wt 163.0 lb

## 2015-06-06 DIAGNOSIS — N3281 Overactive bladder: Secondary | ICD-10-CM | POA: Diagnosis not present

## 2015-06-06 DIAGNOSIS — Z9229 Personal history of other drug therapy: Secondary | ICD-10-CM

## 2015-06-06 DIAGNOSIS — I482 Chronic atrial fibrillation, unspecified: Secondary | ICD-10-CM

## 2015-06-06 DIAGNOSIS — I10 Essential (primary) hypertension: Secondary | ICD-10-CM

## 2015-06-06 DIAGNOSIS — Z7901 Long term (current) use of anticoagulants: Secondary | ICD-10-CM

## 2015-06-06 DIAGNOSIS — E785 Hyperlipidemia, unspecified: Secondary | ICD-10-CM

## 2015-06-06 NOTE — Progress Notes (Signed)
Patient ID: Diana Petersen, female   DOB: 1929/08/09, 82 y.o.   MRN: [id number]    [facility]     [facility] of Service: Clinic (12)     Allergies  Allergen Reactions  . Ace Inhibitors Cough    Cough  . Codeine Nausea And Vomiting  . Penicillins Hives and Rash  . Sulfa Antibiotics Hives and Rash    Chief Complaint  Patient presents with  . 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 Still has urinary frequency at night. She has some edema during the day which resolves overnight. There is evidence of venous incompetency with many superficial vein dilated.    Medications: Patient's Medications  New Prescriptions   No medications on file  Previous Medications   ALBUTEROL (PROVENTIL HFA;VENTOLIN HFA) 108 (90 BASE) MCG/ACT INHALER    Inhale 1-2 puffs into the lungs every 6 (six) hours as needed for wheezing or shortness of breath. Every 6 to 8 hours as needed for wheezing   APIXABAN (ELIQUIS) 5 MG TABS TABLET    One twice daily for anticoagulation   CHERRY CONCENTRATE PO    Take 2 capsules by mouth every evening. "Dark Cherry" for knee pain   DILTIAZEM (CARDIZEM CD) 120 MG 24 HR CAPSULE    TAKE 1 CAPSULE IN THE MORNING ON AN EMPTY STOMACH.   HYDROCHLOROTHIAZIDE (MICROZIDE) 12.5 MG CAPSULE    TAKE (1) CAPSULE DAILY.   LOSARTAN (COZAAR) 100 MG TABLET    TAKE 1 TABLET DAILY TO CONTROL BLOOD PRESSURE.   MULTIPLE VITAMINS-MINERALS (CENTRUM SILVER ADULT 50+ PO)    Take 1 tablet by mouth every morning.    OMEGA-3 FATTY ACIDS (FISH OIL) 1000 MG CAPS    Take 1 capsule by mouth. Twice daily   OXYBUTYNIN (DITROPAN-XL) 5 MG 24 HR TABLET    Take one tablet by  mouth twice daily to help bladder control  Modified Medications   No medications on file  Discontinued Medications   CALCIUM CARBONATE (CALCIUM 600 PO)    Take by mouth. Take one daily     Review of Systems  Constitutional: Negative for fever, chills, diaphoresis, activity change, appetite change, fatigue and unexpected weight change.  HENT: Negative for congestion, ear discharge, ear pain, facial swelling, hearing loss, postnasal drip, rhinorrhea, sinus pressure, sneezing, sore throat, tinnitus, trouble swallowing and voice change.   Eyes: Positive for visual disturbance (Prescription lenses). Negative for pain, redness and itching.  Respiratory: Negative for cough, choking, chest tightness, shortness of breath and wheezing.   Cardiovascular: Negative for chest pain, palpitations and leg swelling.  Gastrointestinal: Positive for diarrhea. Negative for nausea, abdominal pain, constipation and abdominal distention.  Endocrine: Negative for cold intolerance, heat intolerance, polydipsia, polyphagia and polyuria.  Genitourinary: Negative for dysuria, urgency, frequency, hematuria, flank pain, vaginal discharge, difficulty urinating and pelvic pain.  Musculoskeletal: Negative for myalgias, back pain, arthralgias, gait problem, neck pain and neck stiffness.  Skin: Negative for color change, pallor, rash and wound.       Many superficial varicosities  Allergic/Immunologic: Negative.   Neurological: Negative for dizziness, tremors, seizures, syncope, weakness, light-headedness, numbness and headaches.  Hematological: Negative for adenopathy. Does not bruise/bleed easily.  Psychiatric/Behavioral: Negative for  suicidal ideas, hallucinations, behavioral problems, confusion, sleep disturbance, dysphoric mood and agitation. The patient is not nervous/anxious and is not hyperactive.     Filed Vitals:   06/06/15 1427  BP: 104/62  Pulse: 58  Temp: 97.7 F (36.5 C)  TempSrc: Oral  Height: _0   (1.549 m)  Weight: 163 lb (73.936 kg)  SpO2: 95%   Wt Readings from Last 3 Encounters:  06/06/15 163 lb (73.936 kg)  01/17/15 164 lb (74.39 kg)  07/12/14 168 lb (76.204 kg)    Body mass index is 30.81 kg/(m^2).  Physical Exam  Constitutional: She is oriented to person, place, and time. She appears well-developed and well-nourished. No distress.  HENT:  Right Ear: External ear normal.  Left Ear: External ear normal.  Nose: Nose normal.  Mouth/Throat: Oropharynx is clear and moist. No oropharyngeal exudate.  Eyes: Conjunctivae and EOM are normal. Pupils are equal, round, and reactive to light. No scleral icterus.  Neck: No JVD present. No tracheal deviation present. No thyromegaly present.  Cardiovascular: Normal rate, normal heart sounds and intact distal pulses.  Exam reveals no gallop and no friction rub.   No murmur heard. AF  Pulmonary/Chest: Effort normal. No respiratory distress. She has no wheezes. She has no rales. She exhibits no tenderness.  Abdominal: She exhibits no distension and no mass. There is no tenderness.  Musculoskeletal: Normal range of motion. She exhibits edema (Trace bipedal edema). She exhibits no tenderness.  Lymphadenopathy:    She has no cervical adenopathy.  Neurological: She is alert and oriented to person, place, and time. No cranial nerve deficit. Coordination normal.  Skin: No rash noted. She is not diaphoretic. No erythema. No pallor.  Bilateral superficial varicosities  Psychiatric: She has a normal mood and affect. Her behavior is normal. Judgment and thought content normal.     Labs reviewed: Lab Summary Latest Ref Rng 05/28/2015 05/01/2014 10/19/2013 08/06/2013  Hemoglobin 12.0 - 16.0 g/dL (None) (None) 14.3 13.6  Hematocrit 36 - 46 % (None) (None) 41 40.2  White count - (None) (None) 4.7 6.3  Platelet count 150 - 399 K/L (None) (None) 247 221  Sodium 137 - 147 mmol/L (None) 142 141 (None)  Potassium 3.4 - 5.3 mmol/L (None) 3.8 4.1 (None)    Calcium - (None) (None) (None) (None)  Phosphorus - (None) (None) (None) (None)  Creatinine 0.5 - 1.1 mg/dL (None) 0.8 0.8 (None)  AST 13 - 35 U/L (None) (None) 28 (None)  Alk Phos 25 - 125 U/L (None) (None) 68 (None)  Bilirubin - (None) (None) (None) (None)  Glucose - (None) 90 85 (None)  Cholesterol - 196 170 192 (None)  HDL cholesterol - 62 55 56 (None)  Triglycerides - 75 95 103 (None)  LDL Direct - (None) (None) (None) (None)  LDL Calc - (None) 96 115 (None)  Total protein - (None) (None) (None) (None)  Albumin - (None) (None) (None) (None)   Lab Results  Component Value Date   TSH 1.70 10/19/2013   Lab Results  Component Value Date   BUN 15 05/01/2014   BUN 19 10/19/2013   BUN 18 09/09/2012   Lab Results  Component Value Date   CREATININE 0.8 05/01/2014   CREATININE 0.8 10/19/2013   CREATININE 0.9 09/09/2012   No results found for: HGBA1C     Assessment/Plan  1. Essential hypertension -Discontinue HCTZ. Remain on diltiazem and losartan. -CMP, future  2. Chronic a-fib (HCC) Continue anticoagulation with Eliquis  3. HX: anticoagulation Continue anticoagulation  4. Hyperlipidemia -Lipid panel, future  5. Detrusor muscle hypertonia Continue oxybutynin

## 2015-08-03 ENCOUNTER — Other Ambulatory Visit: Payer: Self-pay | Admitting: Internal Medicine

## 2015-10-18 ENCOUNTER — Other Ambulatory Visit: Payer: Self-pay | Admitting: Internal Medicine

## 2015-10-22 ENCOUNTER — Other Ambulatory Visit: Payer: Self-pay | Admitting: Internal Medicine

## 2015-11-18 ENCOUNTER — Ambulatory Visit (INDEPENDENT_AMBULATORY_CARE_PROVIDER_SITE_OTHER): Payer: Medicare Other | Admitting: Nurse Practitioner

## 2015-11-18 ENCOUNTER — Encounter: Payer: Self-pay | Admitting: Nurse Practitioner

## 2015-11-18 VITALS — BP 116/64 | HR 87 | Temp 97.9°F | Resp 18 | Ht 61.0 in | Wt 152.2 lb

## 2015-11-18 DIAGNOSIS — R413 Other amnesia: Secondary | ICD-10-CM | POA: Diagnosis not present

## 2015-11-18 DIAGNOSIS — R1900 Intra-abdominal and pelvic swelling, mass and lump, unspecified site: Secondary | ICD-10-CM | POA: Diagnosis not present

## 2015-11-18 DIAGNOSIS — N3281 Overactive bladder: Secondary | ICD-10-CM

## 2015-11-18 LAB — CBC WITH DIFFERENTIAL/PLATELET
BASOS ABS: 0 {cells}/uL (ref 0–200)
Basophils Relative: 0 %
EOS ABS: 58 {cells}/uL (ref 15–500)
EOS PCT: 1 %
HEMATOCRIT: 37.4 % (ref 35.0–45.0)
HEMOGLOBIN: 12.2 g/dL (ref 11.7–15.5)
LYMPHS ABS: 1160 {cells}/uL (ref 850–3900)
Lymphocytes Relative: 20 %
MCH: 31 pg (ref 27.0–33.0)
MCHC: 32.6 g/dL (ref 32.0–36.0)
MCV: 94.9 fL (ref 80.0–100.0)
MONO ABS: 696 {cells}/uL (ref 200–950)
MPV: 8.9 fL (ref 7.5–12.5)
Monocytes Relative: 12 %
NEUTROS ABS: 3886 {cells}/uL (ref 1500–7800)
NEUTROS PCT: 67 %
PLATELETS: 301 10*3/uL (ref 140–400)
RBC: 3.94 MIL/uL (ref 3.80–5.10)
RDW: 13.9 % (ref 11.0–15.0)
WBC: 5.8 10*3/uL (ref 3.8–10.8)

## 2015-11-18 LAB — COMPREHENSIVE METABOLIC PANEL
ALT: 22 U/L (ref 6–29)
AST: 25 U/L (ref 10–35)
Albumin: 3.5 g/dL — ABNORMAL LOW (ref 3.6–5.1)
Alkaline Phosphatase: 73 U/L (ref 33–130)
BUN: 13 mg/dL (ref 7–25)
CALCIUM: 9.7 mg/dL (ref 8.6–10.4)
CHLORIDE: 103 mmol/L (ref 98–110)
CO2: 30 mmol/L (ref 20–31)
Creat: 0.95 mg/dL — ABNORMAL HIGH (ref 0.60–0.88)
GLUCOSE: 106 mg/dL — AB (ref 65–99)
POTASSIUM: 4.4 mmol/L (ref 3.5–5.3)
Sodium: 141 mmol/L (ref 135–146)
Total Bilirubin: 0.4 mg/dL (ref 0.2–1.2)
Total Protein: 5.9 g/dL — ABNORMAL LOW (ref 6.1–8.1)

## 2015-11-18 MED ORDER — MIRABEGRON ER 25 MG PO TB24: 25.0000 mg | ORAL_TABLET | Freq: Every day | ORAL | 1 refills | Status: DC

## 2015-11-18 NOTE — Progress Notes (Signed)
Careteam: Patient Care Team: Estill Dooms, MD as PCP - General (Internal Medicine) Man Otho Darner, NP as Nurse Practitioner (Nurse Practitioner) Bo Merino, MD as Consulting Physician (Rheumatology) Curly Rim, MD as Consulting Physician (Internal Medicine) Ernst Spell, MD as Referring Physician (Urology) Charlotte Crumb, MD as Consulting Physician (Orthopedic Surgery) Festus Aloe, MD as Consulting Physician (Urology)  Advanced Directive information Does patient have an advance directive?: Yes, Type of Advance Directive: Healthcare Power of Attorney  Allergies  Allergen Reactions  . Ace Inhibitors Cough    Cough  . Codeine Nausea And Vomiting  . Penicillins Hives and Rash  . Sulfa Antibiotics Hives and Rash    Chief Complaint  Patient presents with  . Acute Visit    abdominal extension/mass on right side. Not painful and patient having regular bowel movements     HPI: Patient is a 82 y.o. female seen in the office today due to abdominal distension on right side.  Went to Dr Estanislado Pandy in September due to right knee pain. Was told she was taking in too much sodium and holding on to too much fluid so she was placed on a cardiac diet. Pt stopped eating salt, sugar and oil. Has now dropped 10 lbs. Got down to 150 lbs in 2 weeks. Per epic there has been a 10 lb weight loss since April.  No she noticed that her stomach was not flat. Has a mass on the right side of her abdomen.  Moving her bowels regular. No diarrhea. Good appetite. Now not doing heart healthy completely because she lost so much weight.  Has previously been on toviaz for OAB, this was stopped due to cost and she was placed on ditropan which was not effective so she quit taking. Now with increase symptoms   Review of Systems:  Review of Systems  Constitutional: Positive for unexpected weight change (has modified diet but has had 10 lb weight loss in 2 weeks). Negative for activity change, appetite  change (fair appetite ), chills, diaphoresis, fatigue and fever.  HENT: Negative for congestion, ear discharge, ear pain, facial swelling, hearing loss, postnasal drip, rhinorrhea, sinus pressure, sneezing, sore throat, tinnitus, trouble swallowing and voice change.   Eyes: Visual disturbance: Prescription lenses.  Respiratory: Negative for cough, shortness of breath and wheezing.   Cardiovascular: Positive for leg swelling (bilaterally). Negative for chest pain and palpitations.  Gastrointestinal: Positive for abdominal distention. Negative for abdominal pain, constipation, diarrhea and nausea.       Abdominal pressure  Genitourinary: Negative for difficulty urinating, dysuria, flank pain and frequency.       Overactive bladder  Musculoskeletal: Negative for arthralgias, back pain and gait problem.  Skin:       Many superficial varicosities  Allergic/Immunologic: Negative.   Neurological: Negative for dizziness, tremors, seizures, syncope, weakness, light-headedness, numbness and headaches.  Psychiatric/Behavioral: Negative for confusion.    Past Medical History:  Diagnosis Date  . Arthritis   . Asthma   . Atrial fibrillation ([place])   . Diverticulitis   . Gout   . History of CVA (cerebrovascular accident)    Left basal ganglia   . Hyperlipemia   . Hypertension   . Mitral valve regurgitation   . Spondylosis of cervical spine    Past Surgical History:  Procedure Laterality Date  . COLONOSCOPY  2011   Dr. Cindie Laroche Jule Ser)  . THUMB ARTHROSCOPY Right 2014   Dr. Burney Gauze  . TONSILLECTOMY  1938   Social History:  reports that she quit smoking about 36 years ago. She has never used smokeless tobacco. She reports that she does not drink alcohol or use drugs.  Family History  Problem Relation Age of Onset  . Alzheimer's disease Mother   . Heart disease Father   . Heart disease Brother     Medications: Patient's Medications  New Prescriptions   No medications on  file  Previous Medications   ALBUTEROL (PROVENTIL HFA;VENTOLIN HFA) 108 (90 BASE) MCG/ACT INHALER    Inhale 1-2 puffs into the lungs every 6 (six) hours as needed for wheezing or shortness of breath. Every 6 to 8 hours as needed for wheezing   APIXABAN (ELIQUIS) 5 MG TABS TABLET    One twice daily for anticoagulation   CHERRY CONCENTRATE PO    Take 2 capsules by mouth every evening. "Dark Cherry" for knee pain   DILTIAZEM (CARDIZEM CD) 120 MG 24 HR CAPSULE    TAKE 1 CAPSULE IN THE MORNING ON AN EMPTY STOMACH.   LOSARTAN (COZAAR) 100 MG TABLET    TAKE 1 TABLET DAILY TO CONTROL BLOOD PRESSURE.   MULTIPLE VITAMINS-MINERALS (CENTRUM SILVER ADULT 50+ PO)    Take 1 tablet by mouth every morning.    OMEGA-3 FATTY ACIDS (FISH OIL) 1000 MG CAPS    Take 1 capsule by mouth. Twice daily  Modified Medications   No medications on file  Discontinued Medications   HYDROCHLOROTHIAZIDE (MICROZIDE) 12.5 MG CAPSULE    TAKE (1) CAPSULE DAILY.   OXYBUTYNIN (DITROPAN-XL) 5 MG 24 HR TABLET    TAKE 1 TABLET TWICE DAILY TO HELP BLADDER.     Physical Exam:  Vitals:   11/18/15 1448  BP: 116/64  Pulse: 87  Resp: 18  Temp: 97.9 F (36.6 C)  TempSrc: Oral  SpO2: 95%  Weight: 152 lb 3.2 oz (69 kg)  Height: 5\' 1"  (1.549 m)   Body mass index is 28.76 kg/m.  Physical Exam  Constitutional: She is oriented to person, place, and time. She appears well-developed and well-nourished. No distress.  HENT:  Right Ear: External ear normal.  Left Ear: External ear normal.  Nose: Nose normal.  Mouth/Throat: Oropharynx is clear and moist. No oropharyngeal exudate.  Eyes: Conjunctivae and EOM are normal. Pupils are equal, round, and reactive to light. No scleral icterus.  Neck: No JVD present. No tracheal deviation present. No thyromegaly present.  Cardiovascular: Normal rate and normal heart sounds.   AF  Pulmonary/Chest: Effort normal. No respiratory distress. She has no wheezes. She has no rales. She exhibits no  tenderness.  Abdominal: Soft. Bowel sounds are normal. She exhibits mass. She exhibits no distension. There is no tenderness.  Large palpable mass to right upper and lower quadrant, nontender  Musculoskeletal: Normal range of motion. She exhibits edema (Trace bipedal edema). She exhibits no tenderness.  Lymphadenopathy:    She has no cervical adenopathy.  Neurological: She is alert and oriented to person, place, and time. No cranial nerve deficit. Coordination normal.  Skin: No rash noted. She is not diaphoretic. No erythema. No pallor.  Bilateral superficial varicosities  Psychiatric: She has a normal mood and affect. Her behavior is normal. Judgment and thought content normal.    Labs reviewed: Basic Metabolic Panel: No results for input(s): NA, K, CL, CO2, GLUCOSE, BUN, CREATININE, CALCIUM, MG, PHOS, TSH in the last 8760 hours. Liver Function Tests: No results for input(s): AST, ALT, ALKPHOS, BILITOT, PROT, ALBUMIN in the last 8760 hours. No results for input(s): LIPASE, AMYLASE  in the last 8760 hours. No results for input(s): AMMONIA in the last 8760 hours. CBC: No results for input(s): WBC, NEUTROABS, HGB, HCT, MCV, PLT in the last 8760 hours. Lipid Panel:  Recent Labs  05/28/15  CHOL 196  LDLCALC 119  TRIG 75  CHOLHDL 3.2   TSH: No results for input(s): TSH in the last 8760 hours. A1C: No results found for: HGBA1C   Assessment/Plan 1. Abdominal mass, unspecified abdominal location -large new right sided abdominal mass, non tender, with weight loss  - will get CT Angio Abd/Pel w/ and/or w/o; Future to evaluate  - Comprehensive metabolic panel - CBC with Differential/Platelets  2. Overactive bladder -oxybutynin did not work so she quit taking, toviaz too expensive - mirabegron ER (MYRBETRIQ) 25 MG TB24 tablet; Take 1 tablet (25 mg total) by mouth daily.  Dispense: 30 tablet; Refill: 1  3. Memory loss Asking the same question over again, may be related to stress of  new mass.  Will have pt get MMSE at next visit.   To keep follow up with Dr Assunta Found. Harle Battiest  [facility] [phone] 8 am - 5 pm) [phone] (after hours)

## 2015-11-18 NOTE — Patient Instructions (Addendum)
Will get lab work today Will send for CT scan to evaluate mass

## 2015-11-22 ENCOUNTER — Ambulatory Visit
Admission: RE | Admit: 2015-11-22 | Discharge: 2015-11-22 | Disposition: A | Discharge status: Home / Self Care | Payer: Medicare Other | Source: Ambulatory Visit | Attending: Nurse Practitioner | Admitting: Nurse Practitioner

## 2015-11-22 DIAGNOSIS — R1900 Intra-abdominal and pelvic swelling, mass and lump, unspecified site: Secondary | ICD-10-CM

## 2015-11-22 MED ORDER — IOPAMIDOL (ISOVUE-300) INJECTION 61%
100.0000 mL | Freq: Once | INTRAVENOUS | Status: AC | PRN
  Administered 2015-11-22: 100 mL via INTRAVENOUS

## 2015-11-25 ENCOUNTER — Telehealth: Payer: Self-pay | Admitting: *Deleted

## 2015-11-25 ENCOUNTER — Other Ambulatory Visit: Payer: Self-pay | Admitting: Nurse Practitioner

## 2015-11-25 DIAGNOSIS — C480 Malignant neoplasm of retroperitoneum: Secondary | ICD-10-CM

## 2015-11-25 NOTE — Telephone Encounter (Signed)
See CT report.

## 2015-11-25 NOTE — Telephone Encounter (Signed)
Patient notified of results and will await referral

## 2015-11-25 NOTE — Telephone Encounter (Signed)
Spoke with Tiffany regarding patient CT results, she stated that the patient did not understand why that office was calling her regarding surgery. I explained the results to Tiffany, so that she could explain it to the patient regarding a referral for a specialist.

## 2015-11-25 NOTE — Telephone Encounter (Signed)
Patient is calling requesting CT Scan results done last week. Would like to know the results and plan. Please Advise.

## 2015-11-27 ENCOUNTER — Telehealth: Payer: Self-pay | Admitting: *Deleted

## 2015-11-27 NOTE — Telephone Encounter (Signed)
Patient son, Juluis Mire [facility]) called and was concerned about a mass his mother was telling him about. Discussed the CT Scan with him and informed him of the appointment scheduled with [facility].

## 2015-12-05 ENCOUNTER — Encounter: Payer: Self-pay | Admitting: Internal Medicine

## 2015-12-20 DIAGNOSIS — C499 Malignant neoplasm of connective and soft tissue, unspecified: Secondary | ICD-10-CM

## 2015-12-20 HISTORY — DX: Malignant neoplasm of connective and soft tissue, unspecified: C49.9

## 2015-12-30 ENCOUNTER — Non-Acute Institutional Stay: Payer: Medicare Other | Admitting: Internal Medicine

## 2015-12-30 ENCOUNTER — Encounter: Payer: Self-pay | Admitting: Internal Medicine

## 2015-12-30 DIAGNOSIS — N3000 Acute cystitis without hematuria: Secondary | ICD-10-CM | POA: Diagnosis not present

## 2015-12-30 DIAGNOSIS — I10 Essential (primary) hypertension: Secondary | ICD-10-CM

## 2015-12-30 DIAGNOSIS — N3942 Incontinence without sensory awareness: Secondary | ICD-10-CM | POA: Diagnosis not present

## 2015-12-30 DIAGNOSIS — M47812 Spondylosis without myelopathy or radiculopathy, cervical region: Secondary | ICD-10-CM | POA: Insufficient documentation

## 2015-12-30 DIAGNOSIS — R269 Unspecified abnormalities of gait and mobility: Secondary | ICD-10-CM

## 2015-12-30 DIAGNOSIS — Z7901 Long term (current) use of anticoagulants: Secondary | ICD-10-CM | POA: Insufficient documentation

## 2015-12-30 DIAGNOSIS — K5730 Diverticulosis of large intestine without perforation or abscess without bleeding: Secondary | ICD-10-CM

## 2015-12-30 DIAGNOSIS — R609 Edema, unspecified: Secondary | ICD-10-CM

## 2015-12-30 DIAGNOSIS — C499 Malignant neoplasm of connective and soft tissue, unspecified: Secondary | ICD-10-CM | POA: Diagnosis not present

## 2015-12-30 DIAGNOSIS — M109 Gout, unspecified: Secondary | ICD-10-CM

## 2015-12-30 DIAGNOSIS — I872 Venous insufficiency (chronic) (peripheral): Secondary | ICD-10-CM

## 2015-12-30 DIAGNOSIS — M810 Age-related osteoporosis without current pathological fracture: Secondary | ICD-10-CM | POA: Diagnosis not present

## 2015-12-30 DIAGNOSIS — Z9229 Personal history of other drug therapy: Secondary | ICD-10-CM

## 2015-12-30 DIAGNOSIS — N390 Urinary tract infection, site not specified: Secondary | ICD-10-CM | POA: Insufficient documentation

## 2015-12-30 DIAGNOSIS — I340 Nonrheumatic mitral (valve) insufficiency: Secondary | ICD-10-CM

## 2015-12-30 HISTORY — DX: Gout, unspecified: M10.9

## 2015-12-30 HISTORY — DX: Spondylosis without myelopathy or radiculopathy, cervical region: M47.812

## 2015-12-30 HISTORY — DX: Nonrheumatic mitral (valve) insufficiency: I34.0

## 2015-12-30 HISTORY — DX: Edema, unspecified: R60.9

## 2015-12-30 HISTORY — DX: Unspecified abnormalities of gait and mobility: R26.9

## 2016-01-08 DIAGNOSIS — I272 Pulmonary hypertension, unspecified: Secondary | ICD-10-CM | POA: Insufficient documentation

## 2016-01-09 ENCOUNTER — Encounter: Payer: Medicare Other | Admitting: Nurse Practitioner

## 2016-01-10 ENCOUNTER — Encounter: Payer: Self-pay | Admitting: Nurse Practitioner

## 2016-01-10 ENCOUNTER — Non-Acute Institutional Stay (SKILLED_NURSING_FACILITY): Payer: Medicare Other | Admitting: Nurse Practitioner

## 2016-01-10 DIAGNOSIS — M4712 Other spondylosis with myelopathy, cervical region: Secondary | ICD-10-CM | POA: Diagnosis not present

## 2016-01-10 DIAGNOSIS — I480 Paroxysmal atrial fibrillation: Secondary | ICD-10-CM | POA: Diagnosis not present

## 2016-01-10 DIAGNOSIS — R609 Edema, unspecified: Secondary | ICD-10-CM

## 2016-01-10 DIAGNOSIS — I10 Essential (primary) hypertension: Secondary | ICD-10-CM | POA: Diagnosis not present

## 2016-01-10 NOTE — Progress Notes (Signed)
Provider: Marlana Latus  NP Location:  [facility]   Place of Service:  SNF (31)  PCP: Jeanmarie Hubert, MD Patient Care Team: Estill Dooms, MD as PCP - General (Internal Medicine) Eriq Hufford Otho Darner, NP as Nurse Practitioner (Nurse Practitioner) Bo Merino, MD as Consulting Physician (Rheumatology) Curly Rim, MD as Consulting Physician (Internal Medicine) Ernst Spell, MD as Referring Physician (Urology) Charlotte Crumb, MD as Consulting Physician (Orthopedic Surgery) Festus Aloe, MD as Consulting Physician (Urology)  Extended Emergency Contact Information Primary Emergency Contact: [facility] Address: [address]          [place], [place] [address] [name] of [place] Phone: [phone] Mobile Phone: [phone] Relation: Son Secondary Emergency Contact: Alessandra Grout [place] of [place] Phone: [phone] Relation: Other  Code Status: Full Code Goals of Care: Advanced Directive information Advanced Directives 01/10/2016  Does Patient Have a Medical Advance Directive? Yes  Type of Advance Directive [place]  Does patient want to make changes to medical advance directive? No - Patient declined  Copy of [place] in Chart? Yes      Chief Complaint  Patient presents with  . New Admit To SNF    HPI: Patient is a 82 y.o. female seen today for evaluation of HTN, Controlled, continue Furosemide 40mg , Diltiazem 120mg , Losartan 100mg  daily. Hx of Afib, Heart rate is in controlled, continue diltiazem.    Hospitalized 12/20/15 resection right retroperitoneal mass, right adrenalectomy by Dr. Jyl Heinz  01/02/16 to 01/08/16 for acute hypoxic respiratory failure secondary to fluid overload, moderate to sever pulmonary hypertension Past Medical History:  Diagnosis Date  . Abnormality of gait 12/30/2015  . Arthritis   . Asthma   . Atrial fibrillation ([place])   . DD (diverticular disease)  10/26/2013  . 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 Diverticulitis   . Edema 12/30/2015  . Gout   . Gout 12/30/2015  . History of CVA (cerebrovascular accident)    Left basal ganglia   . Hyperlipemia   . Hypertension   . Liposarcoma ([place]) 12/20/2015   excised 12/20/15  . Mitral valve regurgitation   . Mitral valve regurgitation 12/30/2015  . OP (osteoporosis) 10/26/2013  . PAF (paroxysmal atrial fibrillation) ([place]) 07/28/2013  . Pseudogout of knee 07/28/2013   03/16/13 Uric acid 5.5   . Spondylosis of cervical joint 12/30/2015  . Spondylosis of cervical spine   . TI (tricuspid incompetence) 05/18/2011   Overview:  Moderate severe   . Unspecified vitamin D deficiency 07/28/2013   03/16/13 Vit D 24.6   . Urinary incontinence without sensory awareness 07/28/2013  . Venous (peripheral) insufficiency 02/22/2014   Past Surgical History:  Procedure Laterality Date  . COLONOSCOPY  2011   Dr. Cindie Laroche Jule Ser)  . THUMB ARTHROSCOPY Right 2014   Dr. Burney Gauze  . TONSILLECTOMY  1938    reports that she quit smoking about 36 years ago. She has never used smokeless tobacco. She reports that she does not drink alcohol or use drugs. Social History   Social History  . Marital status: Widowed    Spouse name: N/A  . Number of children: N/A  . Years of education: N/A   Occupational History  . retired Licensed conveyancer     Social History Main Topics  . Smoking status: Former Smoker    Quit date: 07/28/1979  . Smokeless tobacco: Never Used  . Alcohol use No     Comment: Has not drank in months.   Marland Kitchen  Drug use: No  . Sexual activity: No   Other Topics Concern  . Not on file   Social History Narrative   Lives at [facility] since 2013   Widowed   Former smoker, stopped 1981   Alcohol one glass of wine couple nights a week   Exercise pool exercise 3 times a week   POA             Functional Status Survey:    Family History  Problem Relation Age of Onset  . Alzheimer's  disease Mother   . Heart disease Father   . Heart disease Brother     Health Maintenance  Topic Date Due  . DEXA SCAN  05/28/1996  . PNA vac Low Risk Adult (2 of 2 - PCV13) 02/10/2003  . INFLUENZA VACCINE  09/10/2015  . TETANUS/TDAP  08/07/2023  . ZOSTAVAX  Completed    Allergies  Allergen Reactions  . Ace Inhibitors Cough    Cough  . Codeine Nausea And Vomiting  . Penicillins Hives and Rash  . Sulfa Antibiotics Hives and Rash      Medication List       Accurate as of 01/10/16 11:59 PM. Always use your most recent med list.          albuterol 108 (90 Base) MCG/ACT inhaler Commonly known as:  PROVENTIL HFA;VENTOLIN HFA Inhale 1-2 puffs into the lungs every 6 (six) hours as needed for wheezing or shortness of breath. Every 6 to 8 hours as needed for wheezing   apixaban 5 MG Tabs tablet Commonly known as:  ELIQUIS One twice daily for anticoagulation   CENTRUM SILVER ADULT 50+ PO Take 1 tablet by mouth every morning.   diltiazem 120 MG 24 hr capsule Commonly known as:  CARDIZEM CD TAKE 1 CAPSULE IN THE MORNING ON AN EMPTY STOMACH.   furosemide 40 MG tablet Commonly known as:  LASIX Take 40 mg by mouth daily.   gabapentin 100 MG capsule Commonly known as:  NEURONTIN Take 100 mg by mouth 3 (three) times daily.   losartan 100 MG tablet Commonly known as:  COZAAR TAKE 1 TABLET DAILY TO CONTROL BLOOD PRESSURE.   Melatonin 1 MG Tabs Take 3 mg by mouth at bedtime.   OXYGEN Inhale 2 L/min into the lungs daily.       Review of Systems  Constitutional: Negative for activity change, appetite change (fair appetite ), chills, diaphoresis, fatigue, fever and unexpected weight change (has modified diet but has had 10 lb weight loss in 2 weeks).  HENT: Negative for congestion, ear discharge, ear pain, facial swelling, hearing loss, postnasal drip, rhinorrhea, sinus pressure, sneezing, sore throat, tinnitus, trouble swallowing and voice change.   Eyes: Visual  disturbance: Prescription lenses.  Respiratory: Negative for cough, shortness of breath and wheezing.   Cardiovascular: Positive for leg swelling (bilaterally). Negative for chest pain and palpitations.  Gastrointestinal: Negative for abdominal distention, abdominal pain, constipation, diarrhea and nausea.  Genitourinary: Negative for difficulty urinating, dysuria, flank pain and frequency.       Overactive bladder  Musculoskeletal: Negative for arthralgias, back pain and gait problem.  Skin:       Many superficial varicosities. Mid abd healed surgical scar  Allergic/Immunologic: Negative.   Neurological: Negative for dizziness, tremors, seizures, syncope, weakness, light-headedness, numbness and headaches.  Psychiatric/Behavioral: Negative for confusion.    Vitals:   01/10/16 1510  BP: 140/64  Pulse: 86  Resp: 18  Temp: 97.1 F (36.2 C)  SpO2: 94%  Weight: 129 lb (58.5 kg)  Height: 5\' 1"  (1.549 m)   Body mass index is 24.37 kg/m. Physical Exam  Constitutional: She is oriented to person, place, and time. She appears well-developed and well-nourished. No distress.  HENT:  Right Ear: External ear normal.  Left Ear: External ear normal.  Nose: Nose normal.  Mouth/Throat: Oropharynx is clear and moist. No oropharyngeal exudate.  Eyes: Conjunctivae and EOM are normal. Pupils are equal, round, and reactive to light. No scleral icterus.  Neck: No JVD present. No tracheal deviation present. No thyromegaly present.  Cardiovascular: Normal rate and normal heart sounds.   AF  Pulmonary/Chest: Effort normal. No respiratory distress. She has no wheezes. She has no rales. She exhibits no tenderness.  Abdominal: Soft. Bowel sounds are normal. She exhibits no distension and no mass. There is no tenderness.  Large palpable mass to right upper and lower quadrant, nontender  Musculoskeletal: Normal range of motion. She exhibits edema (Trace bipedal edema). She exhibits no tenderness.    Lymphadenopathy:    She has no cervical adenopathy.  Neurological: She is alert and oriented to person, place, and time. No cranial nerve deficit. Coordination normal.  Skin: No rash noted. She is not diaphoretic. No erythema. No pallor.  Bilateral superficial varicosities. Mid abd surgical scar  Psychiatric: She has a normal mood and affect. Her behavior is normal. Judgment and thought content normal.    Labs reviewed: Basic Metabolic Panel:  Recent Labs  11/18/15 1533 01/13/16  NA 141 143  K 4.4 3.2*  CL 103  --   CO2 30  --   GLUCOSE 106*  --   BUN 13 11  CREATININE 0.95* 0.7  CALCIUM 9.7  --    Liver Function Tests:  Recent Labs  11/18/15 1533  AST 25  ALT 22  ALKPHOS 73  BILITOT 0.4  PROT 5.9*  ALBUMIN 3.5*   No results for input(s): LIPASE, AMYLASE in the last 8760 hours. No results for input(s): AMMONIA in the last 8760 hours. CBC:  Recent Labs  11/18/15 1533  WBC 5.8  NEUTROABS 3,886  HGB 12.2  HCT 37.4  MCV 94.9  PLT 301   Cardiac Enzymes: No results for input(s): CKTOTAL, CKMB, CKMBINDEX, TROPONINI in the last 8760 hours. BNP: Invalid input(s): POCBNP No results found for: HGBA1C Lab Results  Component Value Date   TSH 1.70 10/19/2013   No results found for: VITAMINB12 No results found for: FOLATE No results found for: IRON, TIBC, FERRITIN  Imaging and Procedures obtained prior to SNF admission: Ct Abdomen Pelvis W Contrast  Result Date: 11/22/2015 CLINICAL DATA:  Right lower quadrant mass. EXAM: CT ABDOMEN AND PELVIS WITH CONTRAST TECHNIQUE: Multidetector CT imaging of the abdomen and pelvis was performed using the standard protocol following bolus administration of intravenous contrast. CONTRAST:  154mL ISOVUE-300 IOPAMIDOL (ISOVUE-300) INJECTION 61% COMPARISON:  None. FINDINGS: Lower chest:  No pulmonary nodules in the basilar lungs. Hepatobiliary: No focal liver abnormality.No evidence of biliary obstruction or stone. Pancreas:  Unremarkable. Spleen: Unremarkable. Adrenals/Urinary Tract: Displaced right adrenal and kidney retroperitoneal mass. Symmetric renal enhancement. No hydronephrosis. Unremarkable bladder. Stomach/Bowel: Displace bowel and compressed ascending colon. No obstruction. Sigmoid diverticulosis Vascular/Lymphatic: No acute vascular abnormality. No mass or adenopathy. Reproductive:Negative Other: Large right retroperitoneal mass with fatty density, soft tissue density, and multiple vascularized septations. The mass displaces right abdominal viscera and measures up to 25 x 18 x 14 cm. Musculoskeletal: No acute or aggressive finding. Lower lumbar facet arthropathy  with slight L4-5 anterolisthesis. 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 Electronically Signed   By: Monte Fantasia M.D.   On: 11/22/2015 16:09    Assessment/Plan Spondylosis of cervical joint 12/12/15 Dc Gabapentin  Edema Trace BLE, continue Furosemide 40mg , adding Kcl 50meq, 01/13/16 Na 143, K 3.2, Bun 11, creat 0.65  HTN (hypertension) Controlled, continue Furosemide 40mg , Diltiazem 120mg , Losartan 100mg  daily  PAF (paroxysmal atrial fibrillation) (HCC) Heart rate is in controlled, continue diltiazem, Eliquis.       Family/ staff Communication: [place] or [place] when she is able.   Labs/tests ordered: BMP

## 2016-01-13 LAB — BASIC METABOLIC PANEL
BUN: 11 mg/dL (ref 4–21)
Creatinine: 0.7 mg/dL (ref <=1.1)
Glucose: 93 mg/dL
Potassium: 3.2 mmol/L — AB (ref 3.4–5.3)
SODIUM: 143 mmol/L (ref 137–147)

## 2016-01-14 ENCOUNTER — Other Ambulatory Visit: Payer: Self-pay | Admitting: *Deleted

## 2016-01-14 ENCOUNTER — Non-Acute Institutional Stay (SKILLED_NURSING_FACILITY): Payer: Medicare Other | Admitting: Nurse Practitioner

## 2016-01-14 ENCOUNTER — Encounter: Payer: Self-pay | Admitting: Nurse Practitioner

## 2016-01-14 DIAGNOSIS — M47892 Other spondylosis, cervical region: Secondary | ICD-10-CM

## 2016-01-14 DIAGNOSIS — R609 Edema, unspecified: Secondary | ICD-10-CM | POA: Diagnosis not present

## 2016-01-14 DIAGNOSIS — G4700 Insomnia, unspecified: Secondary | ICD-10-CM | POA: Diagnosis not present

## 2016-01-14 DIAGNOSIS — M1000 Idiopathic gout, unspecified site: Secondary | ICD-10-CM

## 2016-01-14 DIAGNOSIS — I480 Paroxysmal atrial fibrillation: Secondary | ICD-10-CM

## 2016-01-14 DIAGNOSIS — E876 Hypokalemia: Secondary | ICD-10-CM | POA: Insufficient documentation

## 2016-01-14 DIAGNOSIS — I10 Essential (primary) hypertension: Secondary | ICD-10-CM

## 2016-01-14 NOTE — Assessment & Plan Note (Addendum)
Stable. dc'd Gabapentin.

## 2016-01-14 NOTE — Assessment & Plan Note (Signed)
continue Furosemide 40mg , adding Kcl 46meq, 01/13/16 Na 143, K 3.2, Bun 11, creat 0.65

## 2016-01-14 NOTE — Assessment & Plan Note (Signed)
Trace BLE, continue Furosemide 40mg , adding Kcl 62meq, 01/13/16 Na 143, K 3.2, Bun 11, creat 0.65

## 2016-01-14 NOTE — Assessment & Plan Note (Signed)
ontrolled, continue Furosemide 40mg , Diltiazem 120mg , Losartan 100mg  daily

## 2016-01-14 NOTE — Assessment & Plan Note (Signed)
12/12/15 Dc Gabapentin

## 2016-01-14 NOTE — Progress Notes (Signed)
Location:  [facility] Room Number: 37 Place of Service:  SNF (31) Provider:  Degan Hanser, [place] NP  Jeanmarie Hubert, MD  Patient Care Team: Estill Dooms, MD as PCP - General (Internal Medicine) Skarlette Lattner Otho Darner, NP as Nurse Practitioner (Nurse Practitioner) Bo Merino, MD as Consulting Physician (Rheumatology) Curly Rim, MD as Consulting Physician (Internal Medicine) Ernst Spell, MD as Referring Physician (Urology) Charlotte Crumb, MD as Consulting Physician (Orthopedic Surgery) Festus Aloe, MD as Consulting Physician (Urology)  Extended Emergency Contact Information Primary Emergency Contact: [facility] Address: [address]          [place], [place] [address] [name] of [place] Phone: [phone] Mobile Phone: [phone] Relation: Son Secondary Emergency Contact: Alessandra Grout [place] of [place] Phone: [phone] Relation: Other  Code Status:  Full Code Goals of care: Advanced Directive information Advanced Directives 01/14/2016  Does Patient Have a Medical Advance Directive? Yes  Type of Advance Directive [place]  Does patient want to make changes to medical advance directive? No - Patient declined  Copy of [place] in Chart? Yes     Chief Complaint  Patient presents with  . Acute Visit    Low potassium    HPI:  Pt is a 82 y.o. female seen today for an acute visit for hypokalemia, K 3.2 01/13/16, Kcl 108meq qd added 01/14/16, f/u BMP scheduled.    HTN, Controlled, continue Furosemide 40mg , Diltiazem 120mg , Losartan 100mg  daily. Hx of Afib, Heart rate is in controlled, continue diltiazem, Eliquis.   Past Medical History:  Diagnosis Date  . Abnormality of gait 12/30/2015  . Arthritis   . Asthma   . Atrial fibrillation ([place])   . DD (diverticular disease) 10/26/2013  . 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 Diverticulitis   . Edema 12/30/2015  . Gout   .  Gout 12/30/2015  . History of CVA (cerebrovascular accident)    Left basal ganglia   . Hyperlipemia   . Hypertension   . Liposarcoma ([place]) 12/20/2015   excised 12/20/15  . Mitral valve regurgitation   . Mitral valve regurgitation 12/30/2015  . OP (osteoporosis) 10/26/2013  . PAF (paroxysmal atrial fibrillation) ([place]) 07/28/2013  . Pseudogout of knee 07/28/2013   03/16/13 Uric acid 5.5   . Spondylosis of cervical joint 12/30/2015  . Spondylosis of cervical spine   . TI (tricuspid incompetence) 05/18/2011   Overview:  Moderate severe   . Unspecified vitamin D deficiency 07/28/2013   03/16/13 Vit D 24.6   . Urinary incontinence without sensory awareness 07/28/2013  . Venous (peripheral) insufficiency 02/22/2014   Past Surgical History:  Procedure Laterality Date  . COLONOSCOPY  2011   Dr. Cindie Laroche Jule Ser)  . THUMB ARTHROSCOPY Right 2014   Dr. Burney Gauze  . TONSILLECTOMY  1938    Allergies  Allergen Reactions  . Ace Inhibitors Cough    Cough  . Codeine Nausea And Vomiting  . Penicillins Hives and Rash  . Sulfa Antibiotics Hives and Rash      Medication List       Accurate as of 01/14/16  5:09 PM. Always use your most recent med list.          albuterol 108 (90 Base) MCG/ACT inhaler Commonly known as:  PROVENTIL HFA;VENTOLIN HFA Inhale 1-2 puffs into the lungs every 6 (six) hours as needed for wheezing or shortness of breath. Every 6 to 8 hours as needed for wheezing   apixaban 5  MG Tabs tablet Commonly known as:  ELIQUIS One twice daily for anticoagulation   CENTRUM SILVER ADULT 50+ PO Take 1 tablet by mouth every morning.   diltiazem 120 MG 24 hr capsule Commonly known as:  CARDIZEM CD TAKE 1 CAPSULE IN THE MORNING ON AN EMPTY STOMACH.   feeding supplement Liqd Take 1 Container by mouth 3 (three) times daily between meals.   furosemide 40 MG tablet Commonly known as:  LASIX Take 40 mg by mouth daily.   losartan 100 MG tablet Commonly known as:   COZAAR TAKE 1 TABLET DAILY TO CONTROL BLOOD PRESSURE.   Melatonin 1 MG Tabs Take 3 mg by mouth at bedtime.   Omega 3 1200 MG Caps Take 1 capsule by mouth daily.   OXYGEN Inhale 2 L/min into the lungs daily.       Review of Systems  Constitutional: Negative for activity change, appetite change (fair appetite ), chills, diaphoresis, fatigue, fever and unexpected weight change (has modified diet but has had 10 lb weight loss in 2 weeks).  HENT: Negative for congestion, ear discharge, ear pain, facial swelling, hearing loss, postnasal drip, rhinorrhea, sinus pressure, sneezing, sore throat, tinnitus, trouble swallowing and voice change.   Eyes: Visual disturbance: Prescription lenses.  Respiratory: Negative for cough, shortness of breath and wheezing.   Cardiovascular: Positive for leg swelling (bilaterally). Negative for chest pain and palpitations.  Gastrointestinal: Negative for abdominal distention, abdominal pain, constipation, diarrhea and nausea.  Genitourinary: Negative for difficulty urinating, dysuria, flank pain and frequency.       Overactive bladder  Musculoskeletal: Negative for arthralgias, back pain and gait problem.  Skin:       Many superficial varicosities. Mid abd healed surgical scar  Allergic/Immunologic: Negative.   Neurological: Negative for dizziness, tremors, seizures, syncope, weakness, light-headedness, numbness and headaches.  Psychiatric/Behavioral: Negative for confusion.    Immunization History  Administered Date(s) Administered  . Influenza-Unspecified 11/09/2012, 11/27/2013, 11/08/2014  . Pneumococcal Polysaccharide-23 02/09/2002  . Td 08/10/2003  . Tdap 08/06/2013  . Zoster 03/12/2005   Pertinent  Health Maintenance Due  Topic Date Due  . DEXA SCAN  05/28/1996  . PNA vac Low Risk Adult (2 of 2 - PCV13) 02/10/2003  . INFLUENZA VACCINE  09/10/2015   Fall Risk  11/18/2015 06/06/2015 06/06/2015 05/10/2014 04/05/2014  Falls in the past year? No No  No No No  Number falls in past yr: - - - - -   Functional Status Survey:    Vitals:   01/14/16 1520  BP: 126/72  Pulse: 90  Resp: 18  Temp: 97 F (36.1 C)  SpO2: 94%  Weight: 125 lb (56.7 kg)  Height: 5\' 1"  (1.549 m)   Body mass index is 23.62 kg/m. Physical Exam  Constitutional: She is oriented to person, place, and time. She appears well-developed and well-nourished. No distress.  HENT:  Right Ear: External ear normal.  Left Ear: External ear normal.  Nose: Nose normal.  Mouth/Throat: Oropharynx is clear and moist. No oropharyngeal exudate.  Eyes: Conjunctivae and EOM are normal. Pupils are equal, round, and reactive to light. No scleral icterus.  Neck: No JVD present. No tracheal deviation present. No thyromegaly present.  Cardiovascular: Normal rate and normal heart sounds.   AF  Pulmonary/Chest: Effort normal. No respiratory distress. She has no wheezes. She has no rales. She exhibits no tenderness.  Abdominal: Soft. Bowel sounds are normal. She exhibits no distension and no mass. There is no tenderness.  Large palpable mass to  right upper and lower quadrant, nontender  Musculoskeletal: Normal range of motion. She exhibits edema (Trace bipedal edema). She exhibits no tenderness.  Lymphadenopathy:    She has no cervical adenopathy.  Neurological: She is alert and oriented to person, place, and time. No cranial nerve deficit. Coordination normal.  Skin: No rash noted. She is not diaphoretic. No erythema. No pallor.  Bilateral superficial varicosities. Mid abd surgical scar  Psychiatric: She has a normal mood and affect. Her behavior is normal. Judgment and thought content normal.    Labs reviewed:  Recent Labs  11/18/15 1533 01/13/16  NA 141 143  K 4.4 3.2*  CL 103  --   CO2 30  --   GLUCOSE 106*  --   BUN 13 11  CREATININE 0.95* 0.7  CALCIUM 9.7  --     Recent Labs  11/18/15 1533  AST 25  ALT 22  ALKPHOS 73  BILITOT 0.4  PROT 5.9*  ALBUMIN 3.5*     Recent Labs  11/18/15 1533  WBC 5.8  NEUTROABS 3,886  HGB 12.2  HCT 37.4  MCV 94.9  PLT 301   Lab Results  Component Value Date   TSH 1.70 10/19/2013   No results found for: HGBA1C Lab Results  Component Value Date   CHOL 196 05/28/2015   HDL 55 05/01/2014   LDLCALC 119 05/28/2015   TRIG 75 05/28/2015   CHOLHDL 3.2 05/28/2015    Significant Diagnostic Results in last 30 days:  No results found.  Assessment/Plan HTN (hypertension) ontrolled, continue Furosemide 40mg , Diltiazem 120mg , Losartan 100mg  daily   PAF (paroxysmal atrial fibrillation) (HCC) Heart rate is in controlled, continue diltiazem, Eliquis.    Spondylosis of cervical joint Stable. dc'd Gabapentin.   Gout Stable,   Edema continue Furosemide 40mg , adding Kcl 47meq, 01/13/16 Na 143, K 3.2, Bun 11, creat 0.65   Hypokalemia hypokalemia, K 3.2 01/13/16, Kcl 59meq qd added 01/14/16, f/u BMP scheduled.   Insomnia Failed Suvorexant-unable to tolerate.       Family/ staff Communication: AL when able.   Labs/tests ordered:  BMP

## 2016-01-14 NOTE — Assessment & Plan Note (Signed)
Stable,

## 2016-01-14 NOTE — Assessment & Plan Note (Signed)
Controlled, continue Furosemide 40mg , Diltiazem 120mg , Losartan 100mg  daily

## 2016-01-14 NOTE — Assessment & Plan Note (Signed)
Failed Suvorexant-unable to tolerate.   

## 2016-01-14 NOTE — Assessment & Plan Note (Signed)
hypokalemia, K 3.2 01/13/16, Kcl 76meq qd added 01/14/16, f/u BMP scheduled.

## 2016-01-14 NOTE — Assessment & Plan Note (Signed)
Heart rate is in controlled, continue diltiazem, Eliquis.

## 2016-01-16 LAB — BASIC METABOLIC PANEL
BUN: 10 mg/dL (ref 4–21)
Creatinine: 0.6 mg/dL (ref 0.5–1.1)
Glucose: 101 mg/dL
Potassium: 3.5 mmol/L (ref 3.4–5.3)
SODIUM: 143 mmol/L (ref 137–147)

## 2016-01-17 ENCOUNTER — Encounter: Payer: Self-pay | Admitting: Internal Medicine

## 2016-01-17 ENCOUNTER — Non-Acute Institutional Stay (SKILLED_NURSING_FACILITY): Payer: Medicare Other | Admitting: Internal Medicine

## 2016-01-17 DIAGNOSIS — R609 Edema, unspecified: Secondary | ICD-10-CM

## 2016-01-17 DIAGNOSIS — F329 Major depressive disorder, single episode, unspecified: Secondary | ICD-10-CM

## 2016-01-17 DIAGNOSIS — I480 Paroxysmal atrial fibrillation: Secondary | ICD-10-CM | POA: Diagnosis not present

## 2016-01-17 DIAGNOSIS — I872 Venous insufficiency (chronic) (peripheral): Secondary | ICD-10-CM | POA: Diagnosis not present

## 2016-01-17 DIAGNOSIS — Z7901 Long term (current) use of anticoagulants: Secondary | ICD-10-CM | POA: Diagnosis not present

## 2016-01-17 NOTE — Progress Notes (Signed)
Progress Note    Location:  [facility] Room Number: [id number] Place of Service:  SNF [phone]) Provider:  Jeanmarie Hubert, MD  Patient Care Team: Estill Dooms, MD as PCP - General (Internal Medicine) Man Otho Darner, NP as Nurse Practitioner (Nurse Practitioner) Bo Merino, MD as Consulting Physician (Rheumatology) Curly Rim, MD as Consulting Physician (Internal Medicine) Ernst Spell, MD as Referring Physician (Urology) Charlotte Crumb, MD as Consulting Physician (Orthopedic Surgery) Festus Aloe, MD as Consulting Physician (Urology)  Extended Emergency Contact Information Primary Emergency Contact: [facility] Address: [address]          [place], [place] [address] [name] of [place] Phone: [phone] Mobile Phone: [phone] Relation: Son Secondary Emergency Contact: Alessandra Grout [place] of [place] Phone: [phone] Relation: Other  Code Status:  Full Code Goals of care: Advanced Directive information Advanced Directives 01/17/2016  Does Patient Have a Medical Advance Directive? Yes  Type of Advance Directive [place]  Does patient want to make changes to medical advance directive? -  Copy of [place] in Chart? Yes     Chief Complaint  Patient presents with  . New Admit To SNF    to [facility]  . Depression    per son    HPI:  Pt is a 82 y.o. female seen today for evaluation of possible depression per her son. Per the staff, she has not had crying spells, eats well, and is participating in all therapies.  Patient was admitted 01/08/16 following hospitalization at [facility] 01/02/16 to 01/08/16 for acute hypoxic respiratory failure due to fluid overload. She was diuresed and stabilized. MI was ruled out. She continued to manifest about 2+ pedal edema.  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  She is admitted to SNF for strengthening, training in safe mobility, and improvement in self care skills.  Past Medical History:  Diagnosis Date  . Abnormality of gait 12/30/2015  . Arthritis   . Asthma   . Atrial fibrillation ([place])   . DD (diverticular disease) 10/26/2013  . 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 Diverticulitis   . Edema 12/30/2015  . Gout   . Gout 12/30/2015  . History of CVA (cerebrovascular accident)    Left basal ganglia   . Hyperlipemia   . Hypertension   . 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 Mitral valve regurgitation   . Mitral valve regurgitation 12/30/2015  . OP (osteoporosis) 10/26/2013  . PAF (paroxysmal atrial fibrillation) ([place]) 07/28/2013  . Pseudogout of knee 07/28/2013   03/16/13 Uric acid 5.5   . Spondylosis of cervical joint 12/30/2015  . Spondylosis of cervical spine   . TI (tricuspid incompetence) 05/18/2011   Overview:  Moderate severe   . Unspecified vitamin D deficiency 07/28/2013   03/16/13 Vit D 24.6   . Urinary incontinence without sensory awareness 07/28/2013  . Venous (peripheral) insufficiency 02/22/2014   Past Surgical History:  Procedure Laterality Date  . COLONOSCOPY  2011   Dr. Cindie Laroche Jule Ser)  . THUMB ARTHROSCOPY Right 2014   Dr. Burney Gauze  . TONSILLECTOMY  1938    Allergies  Allergen Reactions  . Ace Inhibitors Cough    Cough  . Codeine Nausea And Vomiting  . Penicillins Hives and Rash  . Sulfa Antibiotics Hives and Rash      Medication List  Accurate as of 01/17/16  2:07 PM. Always use your most recent med list.          albuterol 108 (90 Base) MCG/ACT inhaler Commonly known as:  PROVENTIL HFA;VENTOLIN HFA Inhale 1-2 puffs into the lungs every 6 (six) hours as needed for wheezing or shortness of breath. Every 6 to 8 hours as needed for wheezing   apixaban 5 MG Tabs tablet Commonly known as:  ELIQUIS One twice daily for anticoagulation   CENTRUM SILVER ADULT 50+  PO Take 1 tablet by mouth every morning.   diltiazem 120 MG 24 hr capsule Commonly known as:  CARDIZEM CD TAKE 1 CAPSULE IN THE MORNING ON AN EMPTY STOMACH.   feeding supplement Liqd Take 1 Container by mouth 3 (three) times daily between meals.   furosemide 40 MG tablet Commonly known as:  LASIX Take 40 mg by mouth daily.   KCL-20 PO Take by mouth. Take one daily for potassium   losartan 100 MG tablet Commonly known as:  COZAAR TAKE 1 TABLET DAILY TO CONTROL BLOOD PRESSURE.   Melatonin 1 MG Tabs Take 3 mg by mouth at bedtime.   Omega 3 1200 MG Caps Take 1 capsule by mouth daily.   OXYGEN Inhale 2 L/min into the lungs daily.       Review of Systems  Constitutional: Negative for activity change, appetite change (fair appetite ), chills, diaphoresis, fatigue, fever and unexpected weight change (has modified diet but has had 10 lb weight loss in 2 weeks).  HENT: Negative for congestion, ear discharge, ear pain, facial swelling, hearing loss, postnasal drip, rhinorrhea, sinus pressure, sneezing, sore throat, tinnitus, trouble swallowing and voice change.   Eyes: Visual disturbance: Prescription lenses.  Respiratory: Negative for cough, shortness of breath and wheezing.   Cardiovascular: Positive for leg swelling (bilaterally). Negative for chest pain and palpitations.  Gastrointestinal: Negative for abdominal distention, abdominal pain, constipation, diarrhea and nausea.  Genitourinary: Negative for difficulty urinating, dysuria, flank pain and frequency.       Overactive bladder  Musculoskeletal: Negative for arthralgias, back pain and gait problem.  Skin:       Many superficial varicosities. Mid abd healed surgical scar  Allergic/Immunologic: Negative.   Neurological: Negative for dizziness, tremors, seizures, syncope, weakness, light-headedness, numbness and headaches.  Psychiatric/Behavioral: Negative for agitation, behavioral problems, confusion, hallucinations and  suicidal ideas.    Immunization History  Administered Date(s) Administered  . Influenza-Unspecified 11/09/2012, 11/27/2013, 11/08/2014  . Pneumococcal Polysaccharide-23 02/09/2002  . Td 08/10/2003  . Tdap 08/06/2013  . Zoster 03/12/2005   Pertinent  Health Maintenance Due  Topic Date Due  . DEXA SCAN  05/28/1996  . PNA vac Low Risk Adult (2 of 2 - PCV13) 02/10/2003  . INFLUENZA VACCINE  09/10/2015   Fall Risk  11/18/2015 06/06/2015 06/06/2015 05/10/2014 04/05/2014  Falls in the past year? No No No No No  Number falls in past yr: - - - - -     Vitals:   01/17/16 1213  BP: (!) 160/78  Pulse: 82  Resp: 18  Temp: 97.6 F (36.4 C)  SpO2: 94%  Weight: 129 lb (58.5 kg)  Height: 5\' 2"  (1.575 m)   Body mass index is 23.59 kg/m. Physical Exam  Constitutional: She is oriented to person, place, and time. She appears well-developed and well-nourished. No distress.  HENT:  Right Ear: External ear normal.  Left Ear: External ear normal.  Nose: Nose normal.  Mouth/Throat: Oropharynx is clear and moist. No oropharyngeal exudate.  Eyes: Conjunctivae and EOM are normal. Pupils are equal, round, and reactive to light. No scleral icterus.  Neck: No JVD present. No tracheal deviation present. No thyromegaly present.  Cardiovascular: Normal rate and normal heart sounds.   AF  Pulmonary/Chest: Effort normal. No respiratory distress. She has no wheezes. She has no rales. She exhibits no tenderness.  Abdominal: Soft. Bowel sounds are normal. She exhibits no distension and no mass. There is no tenderness.  Large palpable mass to right upper and lower quadrant, nontender  Musculoskeletal: Normal range of motion. She exhibits edema (Trace bipedal edema). She exhibits no tenderness.  Lymphadenopathy:    She has no cervical adenopathy.  Neurological: She is alert and oriented to person, place, and time. No cranial nerve deficit. Coordination normal.  Skin: No rash noted. She is not diaphoretic. No  erythema. No pallor.  Bilateral superficial varicosities. Mid abd surgical scar  Psychiatric: She has a normal mood and affect. Her behavior is normal. Judgment and thought content normal.    Labs reviewed:  Recent Labs  11/18/15 1533 01/13/16 01/16/16  NA 141 143 143  K 4.4 3.2* 3.5  CL 103  --   --   CO2 30  --   --   GLUCOSE 106*  --   --   BUN 13 11 10   CREATININE 0.95* 0.7 0.6  CALCIUM 9.7  --   --     Recent Labs  11/18/15 1533  AST 25  ALT 22  ALKPHOS 73  BILITOT 0.4  PROT 5.9*  ALBUMIN 3.5*    Recent Labs  11/18/15 1533  WBC 5.8  NEUTROABS 3,886  HGB 12.2  HCT 37.4  MCV 94.9  PLT 301   Lab Results  Component Value Date   TSH 1.70 10/19/2013   No results found for: HGBA1C Lab Results  Component Value Date   CHOL 196 05/28/2015   HDL 55 05/01/2014   LDLCALC 119 05/28/2015   TRIG 75 05/28/2015   CHOLHDL 3.2 05/28/2015    Assessment/Plan 1. Reactive depression -added citalopram 10 mg qd  2. PAF (paroxysmal atrial fibrillation) (HCC) Rate controlled andf anticoagulated  3. Anticoagulated For AF  4. Edema, unspecified type persistent  5. Venous (peripheral) insufficiency Likely cause of most of the edema

## 2016-01-20 LAB — BASIC METABOLIC PANEL
BUN: 15 mg/dL (ref 4–21)
Creatinine: 0.8 mg/dL (ref <=1.1)
GLUCOSE: 90 mg/dL
POTASSIUM: 4.4 mmol/L (ref 3.4–5.3)
SODIUM: 140 mmol/L (ref 137–147)

## 2016-01-21 ENCOUNTER — Other Ambulatory Visit: Payer: Self-pay | Admitting: *Deleted

## 2016-02-06 DIAGNOSIS — D649 Anemia, unspecified: Secondary | ICD-10-CM | POA: Insufficient documentation

## 2016-02-06 DIAGNOSIS — D500 Iron deficiency anemia secondary to blood loss (chronic): Secondary | ICD-10-CM

## 2016-02-26 DIAGNOSIS — F418 Other specified anxiety disorders: Secondary | ICD-10-CM | POA: Insufficient documentation

## 2016-02-26 MED ORDER — CITALOPRAM HYDROBROMIDE 10 MG PO TABS: ORAL_TABLET | ORAL | 5 refills | Status: DC

## 2016-02-26 NOTE — Progress Notes (Addendum)
HISTORY AND PHYSICAL  Location:    [place] Room Number: 001 Place of Service: ALF (13)   Extended Emergency Contact Information Primary Emergency Contact: [facility] Address: [place], [place] [address] [name] of [place] Phone: [phone] Mobile Phone: [phone] Relation: Son Secondary Emergency Contact: Alessandra Grout States of [place] Phone: [phone] Relation: Other  Advanced Directive information Does Patient Have a Medical Advance Directive?: Yes, Type of Advance Directive: [place], Does patient want to make changes to medical advance directive?: No - Patient declined  Chief Complaint  Patient presents with  . Hospitalization Follow-up    Admit to AL    HPI:  Pt is a 83 y.o. female seen today for admission on 12/27/15 to [facility]  following hospitalization at [facility]. 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 Bonnita Nasuti on 12/20/15. Procedure also included right adrenalectomy.  She has a complicated medical history. Additional problems include, HTN, osteopenia, HLD, PAF, anticoagulation with Lovenox, Urge incontinence, recent UTI with pseudomonas treated with Cipro, edema, gait disturbance, diverticulosis, gout, MVR, spondylosis of cervical spine joints, and hx of CVA left basal ganglia.  She is admitted to SNF for strengthening, training in safe mobility, and improvement in self care skills. She was previously living independently.  Patient states she does not know why she was started on Neurontin. Also, she does not think she needs the Myrbetriq.  Past Medical History:  Diagnosis Date  . Abnormality of gait 12/30/2015  . Arthritis   . Asthma   . Atrial fibrillation ([place])   . DD (diverticular disease) 10/26/2013  . 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 Diverticulitis   . Edema 12/30/2015  . Gout   . Gout 12/30/2015  . History  of CVA (cerebrovascular accident)    Left basal ganglia   . Hyperlipemia   . Hypertension   . Liposarcoma ([place]) 12/20/2015   excised 12/20/15  . Mitral valve regurgitation   . Mitral valve regurgitation 12/30/2015  . OP (osteoporosis) 10/26/2013  . PAF (paroxysmal atrial fibrillation) ([place]) 07/28/2013  . Pseudogout of knee 07/28/2013   03/16/13 Uric acid 5.5   . Spondylosis of cervical joint 12/30/2015  . Spondylosis of cervical spine   . TI (tricuspid incompetence) 05/18/2011   Overview:  Moderate severe   . Unspecified vitamin D deficiency 07/28/2013   03/16/13 Vit D 24.6   . Urinary incontinence without sensory awareness 07/28/2013  . Venous (peripheral) insufficiency 02/22/2014    Past Surgical History:  Procedure Laterality Date  . COLONOSCOPY  2011   Dr. Cindie Laroche Jule Ser)  . THUMB ARTHROSCOPY Right 2014   Dr. Burney Gauze  . TONSILLECTOMY  1938    Patient Care Team: Estill Dooms, MD as PCP - General (Internal Medicine) Man Otho Darner, NP as Nurse Practitioner (Nurse Practitioner) Bo Merino, MD as Consulting Physician (Rheumatology) Curly Rim, MD as Consulting Physician (Internal Medicine) Ernst Spell, MD as Referring Physician (Urology) Charlotte Crumb, MD as Consulting Physician (Orthopedic Surgery) Festus Aloe, MD as Consulting Physician (Urology)  Social History   Social History  . Marital status: Widowed    Spouse name: N/A  . Number of children: N/A  . Years of education: N/A   Occupational History  . retired Licensed conveyancer     Social History Main Topics  . Smoking status: Former Smoker    Quit date: 07/28/1979  .  Smokeless tobacco: Never Used  . Alcohol use No     Comment: Has not drank in months.   . Drug use: No  . Sexual activity: No   Other Topics Concern  . Not on file   Social History Narrative   [facility] at [facility] since 2013   Widowed   Former smoker, stopped 1981   Alcohol one glass of wine couple nights a week    Exercise pool exercise 3 times a week   POA             reports that she quit smoking about 36 years ago. She has never used smokeless tobacco. She reports that she does not drink alcohol or use drugs.  Family History  Problem Relation Age of Onset  . Alzheimer's disease Mother   . Heart disease Father   . Heart disease Brother    Family Status  Relation Status  . Mother Deceased  . Father Deceased at age 62  . Sister Deceased at age 75   car accident 10  . Brother Alive  . Son Alive  . Son Alive  . Son Alive    Immunization History  Administered Date(s) Administered  . Influenza-Unspecified 11/09/2012, 11/27/2013, 11/08/2014, 11/21/2015  . Pneumococcal Polysaccharide-23 02/09/2002  . Td 08/10/2003  . Tdap 08/06/2013  . Zoster 03/12/2005    Allergies  Allergen Reactions  . Ace Inhibitors Cough    Cough  . Codeine Nausea And Vomiting  . Penicillins Hives and Rash  . Sulfa Antibiotics Hives and Rash    Medications: Patient's Medications  New Prescriptions   No medications on file  Previous Medications   ALBUTEROL (PROVENTIL HFA;VENTOLIN HFA) 108 (90 BASE) MCG/ACT INHALER    Inhale 1-2 puffs into the lungs every 6 (six) hours as needed for wheezing or shortness of breath. Every 6 to 8 hours as needed for wheezing   APIXABAN (ELIQUIS) 5 MG TABS TABLET    One twice daily for anticoagulation   DILTIAZEM (CARDIZEM CD) 120 MG 24 HR CAPSULE    TAKE 1 CAPSULE IN THE MORNING ON AN EMPTY STOMACH.   FEEDING SUPPLEMENT (BOOST HIGH PROTEIN) LIQD    Take 1 Container by mouth 3 (three) times daily between meals.   FUROSEMIDE (LASIX) 40 MG TABLET    Take 40 mg by mouth daily.   LOSARTAN (COZAAR) 100 MG TABLET    TAKE 1 TABLET DAILY TO CONTROL BLOOD PRESSURE.   MELATONIN 1 MG TABS    Take 3 mg by mouth at bedtime.   MULTIPLE VITAMINS-MINERALS (CENTRUM SILVER ADULT 50+ PO)    Take 1 tablet by mouth every morning.    OMEGA 3 1200 MG CAPS    Take 1 capsule by mouth daily.    OXYGEN    Inhale 2 L/min into the lungs daily.   POTASSIUM CHLORIDE (KCL-20 PO)    Take by mouth. Take one daily for potassium  Modified Medications   No medications on file  Discontinued Medications   CHERRY CONCENTRATE PO    Take 2 capsules by mouth every evening. "Dark Cherry" for knee pain   MIRABEGRON ER (MYRBETRIQ) 25 MG TB24 TABLET    Take 1 tablet (25 mg total) by mouth daily.   OMEGA-3 FATTY ACIDS (FISH OIL) 1000 MG CAPS    Take 1 capsule by mouth. Twice daily    Review of Systems  Constitutional: Negative for activity change, appetite change, chills, diaphoresis, fatigue, fever and unexpected weight change.  HENT:  Negative for congestion, ear discharge, ear pain, facial swelling, hearing loss, postnasal drip, rhinorrhea, sinus pressure, sneezing, sore throat, tinnitus, trouble swallowing and voice change.   Eyes: Positive for visual disturbance (Prescription lenses). Negative for pain, redness and itching.  Respiratory: Negative for cough, choking, chest tightness, shortness of breath and wheezing.   Cardiovascular: Positive for leg swelling (1-2+ bilateral). Negative for chest pain and palpitations.  Gastrointestinal: Positive for diarrhea. Negative for abdominal distention, abdominal pain, constipation and nausea.       Recent removal of retroperitoneal liposarcoma  Endocrine: Negative for cold intolerance, heat intolerance, polydipsia, polyphagia and polyuria.  Genitourinary: Negative for difficulty urinating, dysuria, flank pain, frequency, hematuria, pelvic pain, urgency and vaginal discharge.  Musculoskeletal: Positive for gait problem. Negative for arthralgias, back pain, myalgias, neck pain and neck stiffness.  Skin: Negative for color change, pallor, rash and wound.       Many superficial varicosities  Allergic/Immunologic: Negative.   Neurological: Positive for weakness. Negative for dizziness, tremors, seizures, syncope, light-headedness, numbness and headaches.    Hematological: Negative for adenopathy. Does not bruise/bleed easily.  Psychiatric/Behavioral: Negative for agitation, behavioral problems, confusion, dysphoric mood, hallucinations, sleep disturbance and suicidal ideas. The patient is not nervous/anxious and is not hyperactive.     Vitals:   12/30/15 1551  BP: 138/72  Pulse: 70  Resp: 18  Temp: 97.8 F (36.6 C)  Weight: 150 lb (68 kg)  Height: _0  (1.549 m)   Body mass index is 28.34 kg/m. Filed Weights   12/30/15 1551  Weight: 150 lb (68 kg)     Physical Exam  Constitutional: She is oriented to person, place, and time. She appears well-developed and well-nourished. No distress.  HENT:  Right Ear: External ear normal.  Left Ear: External ear normal.  Nose: Nose normal.  Mouth/Throat: Oropharynx is clear and moist. No oropharyngeal exudate.  Eyes: Conjunctivae and EOM are normal. Pupils are equal, round, and reactive to light. No scleral icterus.  Neck: No JVD present. No tracheal deviation present. No thyromegaly present.  Cardiovascular: Normal rate, normal heart sounds and intact distal pulses.  Exam reveals no gallop and no friction rub.   No murmur heard. AF  Pulmonary/Chest: Effort normal. No respiratory distress. She has no wheezes. She has no rales. She exhibits no tenderness.  Abdominal: She exhibits no distension and no mass. There is no tenderness.  Musculoskeletal: Normal range of motion. She exhibits edema (1-2+  bipedal edema). She exhibits no tenderness.  Lymphadenopathy:    She has no cervical adenopathy.  Neurological: She is alert and oriented to person, place, and time. No cranial nerve deficit. Coordination normal.  Skin: No rash noted. She is not diaphoretic. No erythema. No pallor.  Bilateral superficial varicosities. Healing wound from xiphoid to poubis.  Psychiatric: She has a normal mood and affect. Her behavior is normal. Judgment and thought content normal.    Labs reviewed: Lab Summary  Latest Ref Rng & Units 01/20/2016 01/16/2016 01/13/2016 11/18/2015 05/28/2015 05/01/2014  Hemoglobin 11.7 - 15.5 g/dL (None) (None) (None) 12.2 (None) (None)  Hematocrit 35.0 - 45.0 % (None) (None) (None) 37.4 (None) (None)  White count 3.8 - 10.8 K/uL (None) (None) (None) 5.8 (None) (None)  Platelet count 140 - 400 K/uL (None) (None) (None) 301 (None) (None)  Sodium 137 - 147 mmol/L 140 143 143 141 (None) 142  Potassium 3.4 - 5.3 mmol/L 4.4 3.5 3.2(A) 4.4 (None) 3.8  Calcium 8.6 - 10.4 mg/dL (None) (None) (None) 9.7 (None) (None)  Phosphorus - (None) (None) (None) (None) (None) (None)  Creatinine 0.5 - 1.1 mg/dL 0.8 0.6 0.7 0.95(H) (None) 0.8  AST 10 - 35 U/L (None) (None) (None) 25 (None) (None)  Alk Phos 33 - 130 U/L (None) (None) (None) 73 (None) (None)  Bilirubin 0.2 - 1.2 mg/dL (None) (None) (None) 0.4 (None) (None)  Glucose mg/dL 90 101 93 106(H) (None) 90  Cholesterol - (None) (None) (None) (None) 196 170  HDL cholesterol - (None) (None) (None) (None) 62 55  Triglycerides - (None) (None) (None) (None) 75 95  LDL Direct - (None) (None) (None) (None) (None) (None)  LDL Calc mg/dL (None) (None) (None) (None) (None) 96  Total protein 6.1 - 8.1 g/dL (None) (None) (None) 5.9(L) (None) (None)  Albumin 3.6 - 5.1 g/dL (None) (None) (None) 3.5(L) (None) (None)  Some recent data might be hidden   Lab Results  Component Value Date   BUN 15 01/20/2016   No results found for: HGBA1C Lab Results  Component Value Date   TSH 1.70 10/19/2013   Ct Abdomen Pelvis W Contrast  Result Date: 11/22/2015 CLINICAL DATA:  Right lower quadrant mass. EXAM: CT ABDOMEN AND PELVIS WITH CONTRAST TECHNIQUE: Multidetector CT imaging of the abdomen and pelvis was performed using the standard protocol following bolus administration of intravenous contrast. CONTRAST:  16m ISOVUE-300 IOPAMIDOL (ISOVUE-300) INJECTION 61% COMPARISON:  None. FINDINGS: Lower chest:  No pulmonary nodules in the basilar lungs.  Hepatobiliary: No focal liver abnormality.No evidence of biliary obstruction or stone. Pancreas: Unremarkable. Spleen: Unremarkable. Adrenals/Urinary Tract: Displaced right adrenal and kidney retroperitoneal mass. Symmetric renal enhancement. No hydronephrosis. Unremarkable bladder. Stomach/Bowel: Displace bowel and compressed ascending colon. No obstruction. Sigmoid diverticulosis Vascular/Lymphatic: No acute vascular abnormality. No mass or adenopathy. Reproductive:Negative Other: Large right retroperitoneal mass with fatty density, soft tissue density, and multiple vascularized septations. The mass displaces right abdominal viscera and measures up to 25 x 18 x 14 cm. Musculoskeletal: No acute or aggressive finding. Lower lumbar facet arthropathy with slight L4-5 anterolisthesis. 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 Electronically Signed   By: JMonte FantasiaM.D.   On: 11/22/2015 16:09    Assessment/Plan  1. Liposarcoma ([place] Excised 12/20/15. Recovereing. Wound healing.  2. Anticoagulated Continue for AF  3. Acute cystitis without hematuria resolved  4. Edema, unspecified type In part related to chronic venous insufficiency  5. Abnormality of gait Started PT and OT for strenghtening and training in safe mobility  6. Diverticulosis of colon without hemorrhage asymptomatic  7. Spondylosis of cervical region without myelopathy or radiculopathy chronic neck discomfort  8. Essential hypertension controlled  9. HX: anticoagulation  for AF  10. Age-related osteoporosis without current pathological fracture Consider additional treatment  11. Urinary incontinence without sensory awareness DC Myrbetriq at request of pt.  12. Venous (peripheral) insufficiency contributes to persistent edema of lower legs

## 2016-09-10 ENCOUNTER — Encounter: Payer: Self-pay | Admitting: Nurse Practitioner

## 2016-09-10 ENCOUNTER — Non-Acute Institutional Stay: Payer: Medicare Other | Admitting: Nurse Practitioner

## 2016-09-10 DIAGNOSIS — D500 Iron deficiency anemia secondary to blood loss (chronic): Secondary | ICD-10-CM

## 2016-09-10 DIAGNOSIS — F5104 Psychophysiologic insomnia: Secondary | ICD-10-CM

## 2016-09-10 DIAGNOSIS — E782 Mixed hyperlipidemia: Secondary | ICD-10-CM | POA: Diagnosis not present

## 2016-09-10 DIAGNOSIS — F418 Other specified anxiety disorders: Secondary | ICD-10-CM | POA: Diagnosis not present

## 2016-09-10 DIAGNOSIS — Z9229 Personal history of other drug therapy: Secondary | ICD-10-CM | POA: Diagnosis not present

## 2016-09-10 DIAGNOSIS — I10 Essential (primary) hypertension: Secondary | ICD-10-CM

## 2016-09-10 DIAGNOSIS — N3281 Overactive bladder: Secondary | ICD-10-CM | POA: Diagnosis not present

## 2016-09-10 DIAGNOSIS — M810 Age-related osteoporosis without current pathological fracture: Secondary | ICD-10-CM | POA: Diagnosis not present

## 2016-09-10 DIAGNOSIS — I480 Paroxysmal atrial fibrillation: Secondary | ICD-10-CM

## 2016-09-10 DIAGNOSIS — I872 Venous insufficiency (chronic) (peripheral): Secondary | ICD-10-CM | POA: Diagnosis not present

## 2016-09-10 NOTE — Progress Notes (Signed)
Location:  [facility] (12) Provider:  Altheria Shadoan, Manxie NP  Teofila Bowery X, NP  Patient Care Team: Amyjo Mizrachi X, NP as PCP - General (Internal Medicine) Maurilio Puryear X, NP as Nurse Practitioner (Nurse Practitioner) Bo Merino, MD as Consulting Physician (Rheumatology) Curly Rim, MD as Consulting Physician (Internal Medicine) Ernst Spell, MD as Referring Physician (Urology) Charlotte Crumb, MD as Consulting Physician (Orthopedic Surgery) Festus Aloe, MD as Consulting Physician (Urology)  Extended Emergency Contact Information Primary Emergency Contact: [facility] Address: [address]          [place], [place] [address] [name] of [place] Phone: [phone] Mobile Phone: [phone] Relation: Son Secondary Emergency Contact: Alessandra Grout States of [place] Phone: [phone] Relation: Other  Code Status:  Full Code Goals of care: Advanced Directive information Advanced Directives 09/10/2016  Does Patient Have a Medical Advance Directive? Yes  Type of Advance Directive [place]  Does patient want to make changes to medical advance directive? No - Patient declined  Copy of [place] in Chart? Yes     Chief Complaint  Patient presents with  . Establish Care    HPI:  Pt is a 83 y.o. female seen today for new patient estabilishment   Hx of HTN, Controlled, continue Furosemide 20mg , Diltiazem 120mg , Losartan 100mg  daily. Hx of Afib, Heart rate is in controlled, takes Eliquid 2.5mg  bid for thromboembolic risk reduction. Hx of anxiety, depression, trouble sleeping, has prn Lorazepam 0.5mg  available to her Past Medical History:  Diagnosis Date  . Abnormality of gait 12/30/2015  . Arthritis   . Asthma   . Atrial fibrillation ([place])   . DD (diverticular disease) 10/26/2013  . 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 Diverticulitis   . Edema 12/30/2015    . Gout   . Gout 12/30/2015  . History of CVA (cerebrovascular accident)    Left basal ganglia   . Hyperlipemia   . Hypertension   . Liposarcoma ([place]) 12/20/2015   excised 12/20/15  . Mitral valve regurgitation   . Mitral valve regurgitation 12/30/2015  . OP (osteoporosis) 10/26/2013  . PAF (paroxysmal atrial fibrillation) ([place]) 07/28/2013  . Pseudogout of knee 07/28/2013   03/16/13 Uric acid 5.5   . Spondylosis of cervical joint 12/30/2015  . Spondylosis of cervical spine   . TI (tricuspid incompetence) 05/18/2011   Overview:  Moderate severe   . Unspecified vitamin D deficiency 07/28/2013   03/16/13 Vit D 24.6   . Urinary incontinence without sensory awareness 07/28/2013  . Venous (peripheral) insufficiency 02/22/2014   Past Surgical History:  Procedure Laterality Date  . COLONOSCOPY  2011   Dr. Cindie Laroche Jule Ser)  . remove fatty tumor  2017  . THUMB ARTHROSCOPY Right 2014   Dr. Burney Gauze  . TONSILLECTOMY  1938    Allergies  Allergen Reactions  . Ace Inhibitors Cough    Cough  . Codeine Nausea And Vomiting  . Penicillins Hives and Rash  . Sulfa Antibiotics Hives and Rash    Allergies as of 09/10/2016      Reactions   Ace Inhibitors Cough   Cough   Codeine Nausea And Vomiting   Penicillins Hives, Rash   Sulfa Antibiotics Hives, Rash      Medication List       Accurate as of 09/10/16  3:41 PM. Always use your most recent med list.          BLACK  CHERRY CONCENTRATE PO Take 2 tablets by mouth daily.   CENTRUM SILVER ADULT 50+ PO Take 1 tablet by mouth every morning.   diltiazem 120 MG 24 hr capsule Commonly known as:  CARDIZEM CD TAKE 1 CAPSULE IN THE MORNING ON AN EMPTY STOMACH.   ELIQUIS 2.5 MG Tabs tablet Generic drug:  apixaban Take 2.5 mg by mouth 2 (two) times daily.   furosemide 20 MG tablet Commonly known as:  LASIX Take 20 mg by mouth daily.   LORazepam 0.5 MG tablet Commonly known as:  ATIVAN Take by mouth 2 (two) times daily as needed  for anxiety. Take 1/2 to 1 tablet   losartan 100 MG tablet Commonly known as:  COZAAR TAKE 1 TABLET DAILY TO CONTROL BLOOD PRESSURE.   Melatonin 5 MG Tabs Take 1 tablet by mouth at bedtime.   Omega 3 1200 MG Caps Take 1 capsule by mouth 2 (two) times daily.       Review of Systems  Constitutional: Negative for activity change, appetite change (fair appetite ) and fatigue. Unexpected weight change: has modified diet but has had 10 lb weight loss in 2 weeks.  HENT: Positive for hearing loss. Negative for congestion, trouble swallowing and voice change.   Eyes: Negative for visual disturbance (Prescription lenses).  Respiratory: Negative for cough, shortness of breath and wheezing.   Cardiovascular: Negative for chest pain, palpitations and leg swelling (bilaterally).  Gastrointestinal: Negative for abdominal distention, abdominal pain, constipation, diarrhea and nausea.  Endocrine: Negative for cold intolerance and heat intolerance.  Genitourinary: Negative for difficulty urinating, dysuria and frequency.       Overactive bladder  Musculoskeletal: Negative for arthralgias, back pain and gait problem.  Skin:       Many superficial varicosities. Mid abd healed surgical scar  Allergic/Immunologic: Negative.   Neurological: Negative for dizziness, seizures, syncope, light-headedness, numbness and headaches.  Psychiatric/Behavioral: Positive for sleep disturbance. Negative for confusion. The patient is nervous/anxious.     Immunization History  Administered Date(s) Administered  . Influenza Split 11/07/2009  . Influenza,inj,quad, With Preservative 01/10/2016  . Influenza-Unspecified 11/09/2012, 11/27/2013, 11/08/2014, 11/21/2015  . Pneumococcal Conjugate-13 01/10/2016  . Pneumococcal Polysaccharide-23 02/09/2002  . Td 08/10/2003  . Tdap 08/06/2013  . Zoster 03/12/2005   Pertinent  Health Maintenance Due  Topic Date Due  . DEXA SCAN  05/28/1996  . INFLUENZA VACCINE  09/09/2016   . PNA vac Low Risk Adult  Completed   Fall Risk  11/18/2015 06/06/2015 06/06/2015 05/10/2014 04/05/2014  Falls in the past year? No No No No No  Number falls in past yr: - - - - -   Functional Status Survey:    Vitals:   09/10/16 1441  BP: (!) 122/58  Pulse: 64  Temp: 97.7 F (36.5 C)  SpO2: 95%  Height: 5\' 2"  (1.575 m)   There is no height or weight on file to calculate BMI. Physical Exam  Constitutional: She is oriented to person, place, and time. She appears well-developed and well-nourished.  HENT:  Right Ear: External ear normal.  Left Ear: External ear normal.  Nose: Nose normal.  Mouth/Throat: Oropharynx is clear and moist.  Eyes: Pupils are equal, round, and reactive to light. Conjunctivae and EOM are normal. No scleral icterus.  Neck: No tracheal deviation present. No thyromegaly present.  Cardiovascular: Normal rate and normal heart sounds.   AF  Pulmonary/Chest: Effort normal. No respiratory distress. She has no wheezes. She has no rales.  Abdominal: Soft. Bowel sounds are normal.  She exhibits no distension. There is no tenderness.  Large palpable mass to right upper and lower quadrant, nontender  Musculoskeletal: Normal range of motion. She exhibits no edema (Trace bipedal edema) or tenderness.  Neurological: She is alert and oriented to person, place, and time. No cranial nerve deficit.  Skin: No rash noted. No erythema. No pallor.  Bilateral superficial varicosities. Mid abd surgical scar  Psychiatric: She has a normal mood and affect. Her behavior is normal. Judgment and thought content normal.    Labs reviewed:  Recent Labs  11/18/15 1533 01/13/16 01/16/16 01/20/16  NA 141 143 143 140  K 4.4 3.2* 3.5 4.4  CL 103  --   --   --   CO2 30  --   --   --   GLUCOSE 106*  --   --   --   BUN 13 11 10 15   CREATININE 0.95* 0.7 0.6 0.8  CALCIUM 9.7  --   --   --     Recent Labs  11/18/15 1533  AST 25  ALT 22  ALKPHOS 73  BILITOT 0.4  PROT 5.9*  ALBUMIN  3.5*    Recent Labs  11/18/15 1533  WBC 5.8  NEUTROABS 3,886  HGB 12.2  HCT 37.4  MCV 94.9  PLT 301   Lab Results  Component Value Date   TSH 1.70 10/19/2013   No results found for: HGBA1C Lab Results  Component Value Date   CHOL 196 05/28/2015   HDL 55 05/01/2014   LDLCALC 119 05/28/2015   TRIG 75 05/28/2015   CHOLHDL 3.2 05/28/2015    Significant Diagnostic Results in last 30 days:  No results found.  Assessment/Plan HTN (hypertension) controlled, continue Furosemide 20mg , Diltiazem 120mg , Losartan 100mg  daily, update CMP, lipid panel prior to the next appointment  PAF (paroxysmal atrial fibrillation) (HCC) Heart rate is in control,  continue diltiazem 120mg  qd, Eliquis 2.5mg  bid po, update CBC TSH  OP (osteoporosis) Obtain Vit D level.   Depression with anxiety She has Lorazepam 0.5mg  1/2-1 tab prn for anxiety and sleep. She has long history of trouble falling asleep, usually past 11pm, taking Motrin, Melatonin. But she doesn't feel exhausted next day, seldom requires a nap during day.   Venous (peripheral) insufficiency Superficial BLE, no open wounds.   Detrusor muscle hypertonia Off Myrbetriq, urination 2-3x/night.   HX: anticoagulation For A-fib, on Eliquis 2.5mg  bid.   Hyperlipidemia Update lipid panel. Diet controlled. Taking Omega 3 1200mg  bid.   Chronic insomnia Chronic, go to sleep after 11pm to 5-6 am, not feeling exhausted next day, seldom requires a nap. She takes OTC Motrin, Melatonin, she has prn Lorazepam available to her.   Blood loss anemia No s/s of bleeding, last Hgb 12.2 11/18/15, update CBC    Family/ staff Communication: IL  Labs/tests ordered: CBC, CMP, TSH, Lipid panel, Vit D prior to the next appointment.   Next appointment: Dr Selinda Michaels am 01/12/2017

## 2016-09-10 NOTE — Assessment & Plan Note (Addendum)
No s/s of bleeding, last Hgb 12.2 11/18/15, update CBC

## 2016-09-10 NOTE — Assessment & Plan Note (Signed)
Heart rate is in control,  continue diltiazem 120mg  qd, Eliquis 2.5mg  bid po, update CBC TSH

## 2016-09-10 NOTE — Assessment & Plan Note (Signed)
Chronic, go to sleep after 11pm to 5-6 am, not feeling exhausted next day, seldom requires a nap. She takes OTC Motrin, Melatonin, she has prn Lorazepam available to her.

## 2016-09-10 NOTE — Assessment & Plan Note (Addendum)
controlled, continue Furosemide 20mg , Diltiazem 120mg , Losartan 100mg  daily, update CMP, lipid panel prior to the next appointment

## 2016-09-10 NOTE — Assessment & Plan Note (Addendum)
Superficial BLE, no open wounds.

## 2016-09-10 NOTE — Assessment & Plan Note (Signed)
For A-fib, on Eliquis 2.5mg  bid.

## 2016-09-10 NOTE — Assessment & Plan Note (Signed)
She has Lorazepam 0.5mg  1/2-1 tab prn for anxiety and sleep. She has long history of trouble falling asleep, usually past 11pm, taking Motrin, Melatonin. But she doesn't feel exhausted next day, seldom requires a nap during day.

## 2016-09-10 NOTE — Assessment & Plan Note (Signed)
Obtain Vit D level.

## 2016-09-10 NOTE — Patient Instructions (Signed)
CBC, CMP, TSH, Lipid panel, Vit D prior to the next appointment. Next appointment: Dr Selinda Michaels am 01/12/2017

## 2016-09-10 NOTE — Assessment & Plan Note (Signed)
Update lipid panel. Diet controlled. Taking Omega 3 1200mg  bid.

## 2016-09-10 NOTE — Assessment & Plan Note (Signed)
Off Myrbetriq, urination 2-3x/night.

## 2016-09-25 ENCOUNTER — Encounter: Payer: Self-pay | Admitting: Cardiovascular Disease

## 2016-09-25 ENCOUNTER — Ambulatory Visit (INDEPENDENT_AMBULATORY_CARE_PROVIDER_SITE_OTHER): Payer: Medicare Other | Admitting: Cardiovascular Disease

## 2016-09-25 ENCOUNTER — Encounter (INDEPENDENT_AMBULATORY_CARE_PROVIDER_SITE_OTHER): Payer: Self-pay

## 2016-09-25 VITALS — BP 122/70 | HR 70 | Ht 62.0 in | Wt 144.2 lb

## 2016-09-25 DIAGNOSIS — Z7901 Long term (current) use of anticoagulants: Secondary | ICD-10-CM | POA: Diagnosis not present

## 2016-09-25 DIAGNOSIS — I480 Paroxysmal atrial fibrillation: Secondary | ICD-10-CM

## 2016-09-25 NOTE — Progress Notes (Signed)
Cardiology Office Note:    Date:  09/25/2016   ID:  Diana Petersen, DOB August 15, 1929, MRN [id number]  PCP:  Mast, Man X, NP  Cardiologist:  Mertie Moores, MD    Referring MD: Christain Sacramento, MD   Chief Complaint  Patient presents with  . Shortness of Breath    Problem list 1. Paroxysmal Atrial fibrillation 2. Pulmonary hypertension 3.  Essential HTN    History of Present Illness:    Diana Petersen is a 83 y.o. female with a hx of  Paroxysmal atrial fib  She is previously seen by Dr. Clovia Cuff at  [facility].   Has pulmonary HTN mentioned in her notes.  Has some DOE   Lives at [facility] ( independent)  Still does all of her normal activities.   Still drives short distances ( limited by her old car)    Past Medical History:  Diagnosis Date  . Abnormality of gait 12/30/2015  . Arthritis   . Asthma   . Atrial fibrillation ([place])   . DD (diverticular disease) 10/26/2013  . 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 Diverticulitis   . Edema 12/30/2015  . Gout   . Gout 12/30/2015  . History of CVA (cerebrovascular accident)    Left basal ganglia   . Hyperlipemia   . Hypertension   . 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 Mitral valve regurgitation   . Mitral valve regurgitation 12/30/2015  . OP (osteoporosis) 10/26/2013  . PAF (paroxysmal atrial fibrillation) ([place]) 07/28/2013  . Pseudogout of knee 07/28/2013   03/16/13 Uric acid 5.5   . Spondylosis of cervical joint 12/30/2015  . Spondylosis of cervical spine   . TI (tricuspid incompetence) 05/18/2011   Overview:  Moderate severe   . Unspecified vitamin D deficiency 07/28/2013   03/16/13 Vit D 24.6   . Urinary incontinence without sensory awareness 07/28/2013  . Venous (peripheral) insufficiency 02/22/2014    Past Surgical History:  Procedure Laterality Date  . COLONOSCOPY  2011   Dr. Cindie Laroche Jule Ser)  . remove fatty tumor  2017  . THUMB ARTHROSCOPY Right 2014   Dr. Burney Gauze  . TONSILLECTOMY   1938    Current Medications: Current Meds  Medication Sig  . apixaban (ELIQUIS) 2.5 MG TABS tablet Take 2.5 mg by mouth 2 (two) times daily.  Marland Kitchen diltiazem (CARDIZEM CD) 120 MG 24 hr capsule TAKE 1 CAPSULE IN THE MORNING ON AN EMPTY STOMACH.  . furosemide (LASIX) 20 MG tablet Take 20 mg by mouth daily.  Marland Kitchen LORazepam (ATIVAN) 0.5 MG tablet Take by mouth 2 (two) times daily as needed for anxiety. Take 1/2 to 1 tablet  . losartan (COZAAR) 100 MG tablet TAKE 1 TABLET DAILY TO CONTROL BLOOD PRESSURE.  . Melatonin 5 MG TABS Take 1 tablet by mouth at bedtime.  . Misc Natural Products (BLACK CHERRY CONCENTRATE PO) Take 2 tablets by mouth daily.  . Multiple Vitamins-Minerals (CENTRUM SILVER ADULT 50+ PO) Take 1 tablet by mouth every morning.   . Omega 3 1200 MG CAPS Take 1 capsule by mouth 2 (two) times daily.      Allergies:   Ace inhibitors; Codeine; Penicillins; and Sulfa antibiotics   Social History   Social History  . Marital status: Widowed    Spouse name: N/A  . Number of children: N/A  . Years of education: N/A   Occupational History  . retired Licensed conveyancer     Social History Main Topics  . Smoking status: Former Smoker  Quit date: 07/28/1979  . Smokeless tobacco: Never Used  . Alcohol use No     Comment: Has not drank in months.   . Drug use: No  . Sexual activity: No   Other Topics Concern  . None   Social History Narrative   Lives at [facility] since 2013   Widowed   Former smoker, stopped 1981   Alcohol one glass of wine couple nights a week   Exercise pool exercise 3 times a week   POA              Family History: The patient's family history includes Alzheimer's disease in her mother; Heart disease in her brother and father. ROS:   Please see the history of present illness.     All other systems reviewed and are negative.  EKGs/Labs/Other Studies Reviewed:    The following studies were reviewed today:   EKG:  EKG is  ordered today.  The ekg  ordered today demonstrates :  Aug. 17, 2018:  NSR at 89.   No ST or T wave change   Recent Labs: 11/18/2015: ALT 22; Hemoglobin 12.2; Platelets 301 01/20/2016: BUN 15; Creatinine 0.8; Potassium 4.4; Sodium 140  Recent Lipid Panel    Component Value Date/Time   CHOL 196 05/28/2015   TRIG 75 05/28/2015   HDL 55 05/01/2014   CHOLHDL 3.2 05/28/2015   VLDL 15 05/28/2015   LDLCALC 119 05/28/2015    Physical Exam:    VS:  BP 122/70   Pulse 70   Ht 5\' 2"  (1.575 m)   Wt 144 lb 3.2 oz (65.4 kg)   SpO2 95%   BMI 26.37 kg/m     Wt Readings from Last 3 Encounters:  09/25/16 144 lb 3.2 oz (65.4 kg)  01/17/16 129 lb (58.5 kg)  01/14/16 125 lb (56.7 kg)     GEN:  Well nourished, well developed in no acute distress HEENT: Normal NECK: No JVD; No carotid bruits LYMPHATICS: No lymphadenopathy CARDIAC: RRR, no murmurs, rubs, gallops RESPIRATORY:  Clear to auscultation without rales, wheezing or rhonchi  ABDOMEN: Soft, non-tender, non-distended MUSCULOSKELETAL:  No edema; No deformity  SKIN: Warm and dry NEUROLOGIC:  Alert and oriented x 3 PSYCHIATRIC:  Normal affect   ASSESSMENT:     PLAN:    In order of problems listed above:  1. Paroxysmal atrial fibrillation: She remains in normal sinus rhythm. She seems to be doing fairly well. Continue low-dose Eliquis . I'll see her again in 6 months for office visit, CBC, and basic medical profile.   Medication Adjustments/Labs and Tests Ordered: Current medicines are reviewed at length with the patient today.  Concerns regarding medicines are outlined above.  No orders of the defined types were placed in this encounter.  No orders of the defined types were placed in this encounter.   Signed, Mertie Moores, MD  09/25/2016 10:21 AM    [place]

## 2016-09-25 NOTE — Patient Instructions (Signed)
Medication Instructions:  Your physician recommends that you continue on your current medications as directed. Please refer to the Current Medication list given to you today.   Labwork: Your physician recommends that you return for lab work in: at next office visit for CBC, BMET   Testing/Procedures: None Ordered   Follow-Up: Your physician wants you to follow-up in: 6 months with Dr. Acie Fredrickson. You will receive a reminder letter in the mail two months in advance. If you don't receive a letter, please call our office to schedule the follow-up appointment.   If you need a refill on your cardiac medications before your next appointment, please call your pharmacy.   Thank you for choosing [facility]! Christen Bame, RN [phone]

## 2016-09-28 ENCOUNTER — Other Ambulatory Visit: Payer: Self-pay | Admitting: Internal Medicine

## 2016-10-17 ENCOUNTER — Encounter (HOSPITAL_COMMUNITY): Payer: Self-pay | Admitting: Obstetrics and Gynecology

## 2016-10-17 ENCOUNTER — Emergency Department (HOSPITAL_COMMUNITY)
Admission: EM | Admit: 2016-10-17 | Discharge: 2016-10-17 | Disposition: A | Discharge status: Home / Self Care | Payer: Medicare Other | Attending: Emergency Medicine | Admitting: Emergency Medicine

## 2016-10-17 DIAGNOSIS — L2489 Irritant contact dermatitis due to other agents: Secondary | ICD-10-CM | POA: Diagnosis not present

## 2016-10-17 DIAGNOSIS — J45909 Unspecified asthma, uncomplicated: Secondary | ICD-10-CM | POA: Diagnosis not present

## 2016-10-17 DIAGNOSIS — Z7901 Long term (current) use of anticoagulants: Secondary | ICD-10-CM | POA: Diagnosis not present

## 2016-10-17 DIAGNOSIS — Z79899 Other long term (current) drug therapy: Secondary | ICD-10-CM | POA: Diagnosis not present

## 2016-10-17 DIAGNOSIS — R21 Rash and other nonspecific skin eruption: Secondary | ICD-10-CM | POA: Diagnosis present

## 2016-10-17 DIAGNOSIS — I10 Essential (primary) hypertension: Secondary | ICD-10-CM | POA: Insufficient documentation

## 2016-10-17 DIAGNOSIS — Z87891 Personal history of nicotine dependence: Secondary | ICD-10-CM | POA: Diagnosis not present

## 2016-10-17 MED ORDER — HYDROCORTISONE 1 % EX CREA
TOPICAL_CREAM | Freq: Two times a day (BID) | CUTANEOUS | Status: AC
  Administered 2016-10-17: 1 via TOPICAL
  Filled 2016-10-17: qty 28

## 2016-10-17 MED ORDER — HYDROXYZINE HCL 10 MG PO TABS
10.0000 mg | ORAL_TABLET | Freq: Once | ORAL | Status: AC
  Administered 2016-10-17: 10 mg via ORAL
  Filled 2016-10-17: qty 1

## 2016-10-17 MED ORDER — HYDROXYZINE HCL 10 MG PO TABS: 10.0000 mg | ORAL_TABLET | Freq: Four times a day (QID) | ORAL | 0 refills | Status: DC

## 2016-10-17 NOTE — Discharge Instructions (Signed)
Apply the hydrocortisone cream to the right wrist and forearm up to 2 times per day.   You can take one tablet of hydroxyzine every 6 hours to help with itching.   You can apply a cool washcloth or compress to the right forearm and wrist to help with your symptoms.  If the skin becomes hot to the the touch, bright red, or swollen, or if you develop a fever or chills, please come back to the Emergency Department or see your primary care doctor for a check up.

## 2016-10-17 NOTE — ED Provider Notes (Signed)
[facility] DEPT Provider Note   CSN: [id number] Arrival date & time: 10/17/16  2029     History   Chief Complaint Chief Complaint  Patient presents with  . Insect Bite    HPI Diana Petersen is a 83 y.o. female who presents to the Emergency Department with a chief complaint of right wrist and forearm itching that began suddenly at 5 PM. She reports she was riding in a friend's car after dinner when her right wrist began to itch. She was concerned she was bitten by an insect so she showed the area to the on-staff nurse at the nursing home who advised her to come to the ED for evaluation. She denies seeing any insects on the skin.  She denies right wrist or elbow pain. No fever or chills. No new soaps, lotions, or detergents. She reports the nurse cleaned the area with alcohol, but no other treatment prior to arrival.   No h/o of DM.   The history is provided by the patient. No language interpreter was used.    Past Medical History:  Diagnosis Date  . Abnormality of gait 12/30/2015  . Arthritis   . Asthma   . Atrial fibrillation ([place])   . DD (diverticular disease) 10/26/2013  . 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 Diverticulitis   . Edema 12/30/2015  . Gout   . Gout 12/30/2015  . History of CVA (cerebrovascular accident)    Left basal ganglia   . Hyperlipemia   . Hypertension   . 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 Mitral valve regurgitation   . Mitral valve regurgitation 12/30/2015  . OP (osteoporosis) 10/26/2013  . PAF (paroxysmal atrial fibrillation) ([place]) 07/28/2013  . Pseudogout of knee 07/28/2013   03/16/13 Uric acid 5.5   . Spondylosis of cervical joint 12/30/2015  . Spondylosis of cervical spine   . TI (tricuspid incompetence) 05/18/2011   Overview:  Moderate severe   . Unspecified vitamin D deficiency 07/28/2013   03/16/13 Vit D 24.6   . Urinary incontinence without sensory awareness 07/28/2013  . Venous (peripheral) insufficiency 02/22/2014     Patient Active Problem List   Diagnosis Date Noted  . Depression with anxiety 02/26/2016  . Blood loss anemia 02/06/2016  . Pulmonary hypertension ([place]) 01/08/2016  . Anticoagulated 12/30/2015  . UTI (urinary tract infection) 12/30/2015  . Edema 12/30/2015  . Abnormality of gait 12/30/2015  . Diverticulosis of colon without hemorrhage 12/30/2015  . Gout 12/30/2015  . Spondylosis of cervical joint 12/30/2015  . Chronic insomnia 05/10/2014  . Venous (peripheral) insufficiency 02/22/2014  . History of CVA (cerebrovascular accident) 12/25/2013  . OP (osteoporosis) 10/26/2013  . 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 DD (diverticular disease) 10/26/2013  . HTN (hypertension) 07/28/2013  . PAF (paroxysmal atrial fibrillation) ([place]) 07/28/2013  . HX: anticoagulation 07/28/2013  . Urinary incontinence without sensory awareness 07/28/2013  . Pseudogout of knee 07/28/2013  . Unspecified vitamin D deficiency 07/28/2013  . Hyperlipidemia 07/28/2013  . TI (tricuspid incompetence) 05/18/2011    Past Surgical History:  Procedure Laterality Date  . COLONOSCOPY  2011   Dr. Cindie Laroche Jule Ser)  . remove fatty tumor  2017  . THUMB ARTHROSCOPY Right 2014   Dr. Burney Gauze  . TONSILLECTOMY  1938    OB History    Gravida Para Term Preterm AB Living             3   SAB TAB Ectopic Multiple Live Births  Home Medications    Prior to Admission medications   Medication Sig Start Date End Date Taking? Authorizing Provider  apixaban (ELIQUIS) 2.5 MG TABS tablet Take 2.5 mg by mouth 2 (two) times daily.    [provider]  diltiazem (CARDIZEM CD) 120 MG 24 hr capsule TAKE 1 CAPSULE IN THE MORNING ON AN EMPTY STOMACH. 05/31/15   Estill Dooms, MD  furosemide (LASIX) 20 MG tablet Take 20 mg by mouth daily.    [provider]  hydrOXYzine (ATARAX/VISTARIL) 10 MG tablet Take 1 tablet (10 mg total) by mouth every 6 (six) hours. 10/17/16   Kobey Sides  A, PA-C  LORazepam (ATIVAN) 0.5 MG tablet Take by mouth 2 (two) times daily as needed for anxiety. Take 1/2 to 1 tablet    [provider]  losartan (COZAAR) 100 MG tablet TAKE 1 TABLET DAILY TO CONTROL BLOOD PRESSURE. 10/22/15   Estill Dooms, MD  Melatonin 5 MG TABS Take 1 tablet by mouth at bedtime.    [provider]  Misc Natural Products (BLACK CHERRY CONCENTRATE PO) Take 2 tablets by mouth daily.    [provider]  Multiple Vitamins-Minerals (CENTRUM SILVER ADULT 50+ PO) Take 1 tablet by mouth every morning.     [provider]  Omega 3 1200 MG CAPS Take 1 capsule by mouth 2 (two) times daily.     [provider]    Family History Family History  Problem Relation Age of Onset  . Alzheimer's disease Mother   . Heart disease Father   . Heart disease Brother     Social History Social History  Substance Use Topics  . Smoking status: Former Smoker    Quit date: 07/28/1979  . Smokeless tobacco: Never Used  . Alcohol use No     Comment: Has not drank in months.      Allergies   Ace inhibitors; Codeine; Penicillins; and Sulfa antibiotics   Review of Systems Review of Systems  Constitutional: Negative for activity change, chills and fever.  Respiratory: Negative for shortness of breath.   Cardiovascular: Negative for chest pain.  Gastrointestinal: Negative for abdominal pain.  Musculoskeletal: Negative for arthralgias, back pain, gait problem and myalgias.  Skin: Positive for color change. Negative for rash.       Pruritus  Neurological: Negative for weakness and numbness.   Physical Exam Updated Vital Signs BP (!) 147/68 (BP Location: Left Arm)   Pulse 92   Temp 98 F (36.7 C) (Oral)   Resp 18   Ht 5\' 2"  (1.575 m)   Wt 65.8 kg (145 lb)   SpO2 94%   BMI 26.52 kg/m   Physical Exam  Constitutional: No distress.  HENT:  Head: Normocephalic.  Eyes: Conjunctivae are normal.  Neck: Neck supple.  Cardiovascular: Normal  rate and regular rhythm.  Exam reveals no gallop and no friction rub.   No murmur heard. Pulmonary/Chest: Effort normal. No respiratory distress.  Abdominal: Soft. She exhibits no distension.  Musculoskeletal:  Mild erythema and edema to the right wrist over the radial aspect, extending up the right forearm. No warmth. No puncture site. Full active and passive ROM of the right wrist and elbow. Able to move all fingers independently. Radial pulses 2+ Sensation is intact. No tenderness throughout the exam.   Neurological: She is alert.  Skin: Skin is warm. No rash noted.  Psychiatric: Her behavior is normal.  Nursing note and vitals reviewed.    ED Treatments / Results  Labs (all labs ordered are listed, but only abnormal results are displayed) Labs Reviewed - No data to display  EKG  EKG Interpretation None       Radiology No results found.  Procedures Procedures (including critical care time)  Medications Ordered in ED Medications  hydrocortisone cream 1 % (1 application Topical Given 10/17/16 2205)  hydrOXYzine (ATARAX/VISTARIL) tablet 10 mg (10 mg Oral Given 10/17/16 2204)     Initial Impression / Assessment and Plan / ED Course  I have reviewed the triage vital signs and the nursing notes.  Pertinent labs & imaging results that were available during my care of the patient were reviewed by me and considered in my medical decision making (see chart for details).     82 year old female presenting with non-tender right wrist pruritus that began this evening. Mild edema and erythema noted along the radial aspect of the wrist extending up the right forearm. No warm. The patient is not immunocompromised. The patient was seen and evaluated by Dr. Laverta Baltimore, attending physician. Hydrocortisone cream and hydroxyzine given in the ED. Will provide the patient with an Rx of hydroxyzine for pruritus. Strict return precautions given including fever, chills, or if the area continues to become  more red, swollen or hot. Vital signs stable. No acute distress. The patient is safe for discharge at this time.    Final Clinical Impressions(s) / ED Diagnoses   Final diagnoses:  Irritant contact dermatitis due to other agents    New Prescriptions Discharge Medication List as of 10/17/2016 10:13 PM    START taking these medications   Details  hydrOXYzine (ATARAX/VISTARIL) 10 MG tablet Take 1 tablet (10 mg total) by mouth every 6 (six) hours., Starting Sat 10/17/2016, Print         Lynnleigh Soden A, PA-C 10/17/16 2314    Margette Fast, MD 10/18/16 1220

## 2016-10-17 NOTE — ED Triage Notes (Signed)
Per Pt: Pt presents to the ED with a reported bug bite. The right wrist has swelling and itching that seems to radiate up her forearm. Pt's arm is red. Pt denies pain or trauma to the area.

## 2016-10-19 ENCOUNTER — Ambulatory Visit: Payer: Medicare Other | Admitting: Cardiovascular Disease

## 2016-11-09 ENCOUNTER — Telehealth: Payer: Self-pay | Admitting: Cardiovascular Disease

## 2016-11-09 NOTE — Telephone Encounter (Signed)
Spoke with patient to advise her that Dr. Acie Fredrickson has not diagnosed her with CHF. I reviewed her medical records from [facility] and advised that the diagnosis of CHF is associated with her chart but the 01/03/16 echo does not specifically say that she has diastolic or systolic HF. She states [employer] called and advised her that they have programs for her if she has had the diagnosis of CHF. She states she does not recall ever being told that she does. I advised her that this is what she should report to [facility]. She thanked me for the call.

## 2016-11-09 NOTE — Telephone Encounter (Signed)
Patient has not had echocardiogram; routing message to Dr. Acie Fredrickson to determine if she has clinical signs of CHF.

## 2016-11-09 NOTE — Telephone Encounter (Signed)
New message      Calling to see if she has a diagnosis of heart failure?   Please call and let her know

## 2016-12-25 ENCOUNTER — Other Ambulatory Visit: Payer: Self-pay | Admitting: *Deleted

## 2016-12-25 DIAGNOSIS — I480 Paroxysmal atrial fibrillation: Secondary | ICD-10-CM

## 2016-12-25 DIAGNOSIS — I10 Essential (primary) hypertension: Secondary | ICD-10-CM

## 2016-12-25 DIAGNOSIS — E7849 Other hyperlipidemia: Secondary | ICD-10-CM

## 2016-12-25 NOTE — Progress Notes (Signed)
lipid

## 2017-01-07 ENCOUNTER — Non-Acute Institutional Stay: Payer: Medicare Other

## 2017-01-07 VITALS — BP 115/60 | HR 100 | Temp 97.5°F | Ht 62.0 in | Wt 153.0 lb

## 2017-01-07 DIAGNOSIS — Z0000 Encounter for general adult medical examination without abnormal findings: Secondary | ICD-10-CM | POA: Diagnosis not present

## 2017-01-07 LAB — CBC
HEMATOCRIT: 41.5 % (ref 35.0–45.0)
Hemoglobin: 14.1 g/dL (ref 11.7–15.5)
MCH: 32.2 pg (ref 27.0–33.0)
MCHC: 34 g/dL (ref 32.0–36.0)
MCV: 94.7 fL (ref 80.0–100.0)
MPV: 9.3 fL (ref 7.5–12.5)
Platelets: 234 10*3/uL (ref 140–400)
RBC: 4.38 10*6/uL (ref 3.80–5.10)
RDW: 11.9 % (ref 11.0–15.0)
WBC: 5.4 10*3/uL (ref 3.8–10.8)

## 2017-01-07 LAB — COMPLETE METABOLIC PANEL WITH GFR
AG RATIO: 1.7 (calc) (ref 1.0–2.5)
ALBUMIN MSPROF: 3.8 g/dL (ref 3.6–5.1)
ALT: 18 U/L (ref 6–29)
AST: 22 U/L (ref 10–35)
Alkaline phosphatase (APISO): 68 U/L (ref 33–130)
BUN: 19 mg/dL (ref 7–25)
CALCIUM: 9.8 mg/dL (ref 8.6–10.4)
CO2: 27 mmol/L (ref 20–32)
Chloride: 104 mmol/L (ref 98–110)
Creat: 0.86 mg/dL (ref 0.60–0.88)
GFR, EST NON AFRICAN AMERICAN: 62 mL/min/{1.73_m2} (ref >=60)
GFR, Est African American: 71 mL/min/{1.73_m2} (ref >=60)
GLOBULIN: 2.3 g/dL (ref 1.9–3.7)
Glucose, Bld: 89 mg/dL (ref 65–99)
POTASSIUM: 4 mmol/L (ref 3.5–5.3)
SODIUM: 140 mmol/L (ref 135–146)
Total Bilirubin: 0.7 mg/dL (ref 0.2–1.2)
Total Protein: 6.1 g/dL (ref 6.1–8.1)

## 2017-01-07 LAB — LIPID PANEL
CHOLESTEROL: 161 mg/dL (ref <200)
HDL: 69 mg/dL (ref >50)
LDL CHOLESTEROL (CALC): 76 mg/dL
NON-HDL CHOLESTEROL (CALC): 92 mg/dL (ref <130)
Total CHOL/HDL Ratio: 2.3 (calc) (ref <5.0)
Triglycerides: 79 mg/dL (ref <150)

## 2017-01-07 LAB — VITAMIN D 1,25 DIHYDROXY
VITAMIN D3 1, 25 (OH): 37 pg/mL
Vitamin D 1, 25 (OH)2 Total: 37 pg/mL (ref 18–72)
Vitamin D2 1, 25 (OH)2: <8 pg/mL

## 2017-01-07 LAB — TSH: TSH: 1.3 mIU/L (ref 0.40–4.50)

## 2017-01-07 MED ORDER — ZOSTER VAC RECOMB ADJUVANTED 50 MCG/0.5ML IM SUSR: 0.5000 mL | Freq: Once | INTRAMUSCULAR | 1 refills | Status: AC

## 2017-01-07 NOTE — Patient Instructions (Signed)
Ms. Diana Petersen , Thank you for taking time to come for your Medicare Wellness Visit. I appreciate your ongoing commitment to your health goals. Please review the following plan we discussed and let me know if I can assist you in the future.   Screening recommendations/referrals: Colonoscopy excluded, you are over age 81 Mammogram excluded, you are over age 40 Bone Density up to date Recommended yearly ophthalmology/optometry visit for glaucoma screening and checkup Recommended yearly dental visit for hygiene and checkup  Vaccinations: Influenza vaccine up to date. Due 2019 fall season Pneumococcal vaccine up to date Tdap vaccine up to date. Due 08/07/2023 Shingles vaccine due, prescription sent to pharmacy   Advanced directives: In Chart  Conditions/risks identified: None  Next appointment: Dr. Bubba Camp 01/12/2017 @ 8:30am   Preventive Care 65 Years and Older, Female Preventive care refers to lifestyle choices and visits with your health care provider that can promote health and wellness. What does preventive care include?  A yearly physical exam. This is also called an annual well check.  Dental exams once or twice a year.  Routine eye exams. Ask your health care provider how often you should have your eyes checked.  Personal lifestyle choices, including:  Daily care of your teeth and gums.  Regular physical activity.  Eating a healthy diet.  Avoiding tobacco and drug use.  Limiting alcohol use.  Practicing safe sex.  Taking low-dose aspirin every day.  Taking vitamin and mineral supplements as recommended by your health care provider. What happens during an annual well check? The services and screenings done by your health care provider during your annual well check will depend on your age, overall health, lifestyle risk factors, and family history of disease. Counseling  Your health care provider may ask you questions about your:  Alcohol use.  Tobacco  use.  Drug use.  Emotional well-being.  Home and relationship well-being.  Sexual activity.  Eating habits.  History of falls.  Memory and ability to understand (cognition).  Work and work Statistician.  Reproductive health. Screening  You may have the following tests or measurements:  Height, weight, and BMI.  Blood pressure.  Lipid and cholesterol levels. These may be checked every 5 years, or more frequently if you are over 66 years old.  Skin check.  Lung cancer screening. You may have this screening every year starting at age 59 if you have a 30-pack-year history of smoking and currently smoke or have quit within the past 15 years.  Fecal occult blood test (FOBT) of the stool. You may have this test every year starting at age 15.  Flexible sigmoidoscopy or colonoscopy. You may have a sigmoidoscopy every 5 years or a colonoscopy every 10 years starting at age 12.  Hepatitis C blood test.  Hepatitis B blood test.  Sexually transmitted disease (STD) testing.  Diabetes screening. This is done by checking your blood sugar (glucose) after you have not eaten for a while (fasting). You may have this done every 1-3 years.  Bone density scan. This is done to screen for osteoporosis. You may have this done starting at age 87.  Mammogram. This may be done every 1-2 years. Talk to your health care provider about how often you should have regular mammograms. Talk with your health care provider about your test results, treatment options, and if necessary, the need for more tests. Vaccines  Your health care provider may recommend certain vaccines, such as:  Influenza vaccine. This is recommended every year.  Tetanus, diphtheria,  and acellular pertussis (Tdap, Td) vaccine. You may need a Td booster every 10 years.  Zoster vaccine. You may need this after age 50.  Pneumococcal 13-valent conjugate (PCV13) vaccine. One dose is recommended after age 14.  Pneumococcal  polysaccharide (PPSV23) vaccine. One dose is recommended after age 1. Talk to your health care provider about which screenings and vaccines you need and how often you need them. This information is not intended to replace advice given to you by your health care provider. Make sure you discuss any questions you have with your health care provider. Document Released: 02/22/2015 Document Revised: 10/16/2015 Document Reviewed: 11/27/2014 Elsevier Interactive Patient Education  2017 [place] Prevention in the Home Falls can cause injuries. They can happen to people of all ages. There are many things you can do to make your home safe and to help prevent falls. What can I do on the outside of my home?  Regularly fix the edges of walkways and driveways and fix any cracks.  Remove anything that might make you trip as you walk through a door, such as a raised step or threshold.  Trim any bushes or trees on the path to your home.  Use bright outdoor lighting.  Clear any walking paths of anything that might make someone trip, such as rocks or tools.  Regularly check to see if handrails are loose or broken. Make sure that both sides of any steps have handrails.  Any raised decks and porches should have guardrails on the edges.  Have any leaves, snow, or ice cleared regularly.  Use sand or salt on walking paths during winter.  Clean up any spills in your garage right away. This includes oil or grease spills. What can I do in the bathroom?  Use night lights.  Install grab bars by the toilet and in the tub and shower. Do not use towel bars as grab bars.  Use non-skid mats or decals in the tub or shower.  If you need to sit down in the shower, use a plastic, non-slip stool.  Keep the floor dry. Clean up any water that spills on the floor as soon as it happens.  Remove soap buildup in the tub or shower regularly.  Attach bath mats securely with double-sided non-slip rug  tape.  Do not have throw rugs and other things on the floor that can make you trip. What can I do in the bedroom?  Use night lights.  Make sure that you have a light by your bed that is easy to reach.  Do not use any sheets or blankets that are too big for your bed. They should not hang down onto the floor.  Have a firm chair that has side arms. You can use this for support while you get dressed.  Do not have throw rugs and other things on the floor that can make you trip. What can I do in the kitchen?  Clean up any spills right away.  Avoid walking on wet floors.  Keep items that you use a lot in easy-to-reach places.  If you need to reach something above you, use a strong step stool that has a grab bar.  Keep electrical cords out of the way.  Do not use floor polish or wax that makes floors slippery. If you must use wax, use non-skid floor wax.  Do not have throw rugs and other things on the floor that can make you trip. What can I do with my  stairs?  Do not leave any items on the stairs.  Make sure that there are handrails on both sides of the stairs and use them. Fix handrails that are broken or loose. Make sure that handrails are as long as the stairways.  Check any carpeting to make sure that it is firmly attached to the stairs. Fix any carpet that is loose or worn.  Avoid having throw rugs at the top or bottom of the stairs. If you do have throw rugs, attach them to the floor with carpet tape.  Make sure that you have a light switch at the top of the stairs and the bottom of the stairs. If you do not have them, ask someone to add them for you. What else can I do to help prevent falls?  Wear shoes that:  Do not have high heels.  Have rubber bottoms.  Are comfortable and fit you well.  Are closed at the toe. Do not wear sandals.  If you use a stepladder:  Make sure that it is fully opened. Do not climb a closed stepladder.  Make sure that both sides of the  stepladder are locked into place.  Ask someone to hold it for you, if possible.  Clearly mark and make sure that you can see:  Any grab bars or handrails.  First and last steps.  Where the edge of each step is.  Use tools that help you move around (mobility aids) if they are needed. These include:  Canes.  Walkers.  Scooters.  Crutches.  Turn on the lights when you go into a dark area. Replace any light bulbs as soon as they burn out.  Set up your furniture so you have a clear path. Avoid moving your furniture around.  If any of your floors are uneven, fix them.  If there are any pets around you, be aware of where they are.  Review your medicines with your doctor. Some medicines can make you feel dizzy. This can increase your chance of falling. Ask your doctor what other things that you can do to help prevent falls. This information is not intended to replace advice given to you by your health care provider. Make sure you discuss any questions you have with your health care provider. Document Released: 11/22/2008 Document Revised: 07/04/2015 Document Reviewed: 03/02/2014 Elsevier Interactive Patient Education  2017 [employer].

## 2017-01-07 NOTE — Progress Notes (Signed)
Subjective:   Diana Petersen is a 83 y.o. female who presents for an Initial Medicare Annual Wellness Visit at [facility]       Objective:    Today's Vitals   01/07/17 0847  BP: 115/60  Pulse: 100  Temp: (!) 97.5 F (36.4 C)  TempSrc: Oral  SpO2: 95%  Weight: 153 lb (69.4 kg)  Height: 5\' 2"  (1.575 m)   Body mass index is 27.98 kg/m.   Current Medications (verified) Outpatient Encounter Medications as of 01/07/2017  Medication Sig  . apixaban (ELIQUIS) 2.5 MG TABS tablet Take 2.5 mg by mouth 2 (two) times daily.  Marland Kitchen diltiazem (CARDIZEM CD) 120 MG 24 hr capsule TAKE 1 CAPSULE IN THE MORNING ON AN EMPTY STOMACH.  . hydrOXYzine (ATARAX/VISTARIL) 10 MG tablet Take 1 tablet (10 mg total) by mouth every 6 (six) hours.  Marland Kitchen LORazepam (ATIVAN) 0.5 MG tablet Take by mouth 2 (two) times daily as needed for anxiety. Take 1/2 to 1 tablet  . losartan (COZAAR) 100 MG tablet TAKE 1 TABLET DAILY TO CONTROL BLOOD PRESSURE.  . Melatonin 5 MG TABS Take 1 tablet by mouth at bedtime.  . mirabegron ER (MYRBETRIQ) 25 MG TB24 tablet Take 25 mg by mouth daily.  . Misc Natural Products (BLACK CHERRY CONCENTRATE PO) Take 2 tablets by mouth daily.  . Multiple Vitamins-Minerals (CENTRUM SILVER ADULT 50+ PO) Take 1 tablet by mouth every morning.   . Omega 3 1200 MG CAPS Take 1 capsule by mouth 2 (two) times daily.   . furosemide (LASIX) 20 MG tablet Take 20 mg by mouth daily.   No facility-administered encounter medications on file as of 01/07/2017.     Allergies (verified) Ace inhibitors; Codeine; Penicillins; and Sulfa antibiotics   History: Past Medical History:  Diagnosis Date  . Abnormality of gait 12/30/2015  . Arthritis   . Asthma   . Atrial fibrillation ([place])   . DD (diverticular disease) 10/26/2013  . 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 Diverticulitis   . Edema 12/30/2015  . Gout   . Gout 12/30/2015  . History of CVA (cerebrovascular accident)     Left basal ganglia   . Hyperlipemia   . Hypertension   . 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 Mitral valve regurgitation   . Mitral valve regurgitation 12/30/2015  . OP (osteoporosis) 10/26/2013  . PAF (paroxysmal atrial fibrillation) ([place]) 07/28/2013  . Pseudogout of knee 07/28/2013   03/16/13 Uric acid 5.5   . Spondylosis of cervical joint 12/30/2015  . Spondylosis of cervical spine   . TI (tricuspid incompetence) 05/18/2011   Overview:  Moderate severe   . Unspecified vitamin D deficiency 07/28/2013   03/16/13 Vit D 24.6   . Urinary incontinence without sensory awareness 07/28/2013  . Venous (peripheral) insufficiency 02/22/2014   Past Surgical History:  Procedure Laterality Date  . COLONOSCOPY  2011   Dr. Cindie Laroche Jule Ser)  . remove fatty tumor  2017  . THUMB ARTHROSCOPY Right 2014   Dr. Burney Gauze  . TONSILLECTOMY  1938   Family History  Problem Relation Age of Onset  . Alzheimer's disease Mother   . Heart disease Father   . Heart disease Brother    Social History   Occupational History  . Occupation: retired Licensed conveyancer   Tobacco Use  . Smoking status: Former Smoker    Last attempt to quit: 07/28/1979    Years since quitting: 37.4  . Smokeless tobacco: Never Used  Substance and Sexual Activity  . Alcohol use: No    Comment: Has not drank in months.   . Drug use: No  . Sexual activity: No    Tobacco Counseling Counseling given: Not Answered   Activities of Daily Living In your present state of health, do you have any difficulty performing the following activities: 01/07/2017  Hearing? N  Vision? N  Difficulty concentrating or making decisions? Y  Walking or climbing stairs? N  Dressing or bathing? N  Doing errands, shopping? N  Preparing Food and eating ? N  Using the Toilet? N  In the past six months, have you accidently leaked urine? Y  Do you have problems with loss of bowel control? N  Managing your Medications? N  Managing your  Finances? N  Housekeeping or managing your Housekeeping? N  Some recent data might be hidden    Immunizations and Health Maintenance Immunization History  Administered Date(s) Administered  . Influenza Split 11/07/2009  . Influenza, High Dose Seasonal PF 11/18/2016  . Influenza,inj,quad, With Preservative 01/10/2016  . Influenza-Unspecified 11/09/2012, 11/27/2013, 11/08/2014, 11/21/2015  . Pneumococcal Conjugate-13 01/10/2016  . Pneumococcal Polysaccharide-23 02/09/2002  . Td 08/10/2003  . Tdap 08/06/2013  . Zoster 03/12/2005   There are no preventive care reminders to display for this patient.  Patient Care Team: Mast, Man X, NP as PCP - General (Internal Medicine) Mast, Man X, NP as Nurse Practitioner (Nurse Practitioner) Bo Merino, MD as Consulting Physician (Rheumatology) Curly Rim, MD as Consulting Physician (Internal Medicine) Ernst Spell, MD as Referring Physician (Urology) Charlotte Crumb, MD as Consulting Physician (Orthopedic Surgery) Festus Aloe, MD as Consulting Physician (Urology)  Indicate any recent Medical Services you may have received from other than [facility] providers in the past year (date may be approximate).     Assessment:   This is a routine wellness examination for Diana Petersen.   Hearing/Vision screen Hearing Screening Comments: Pt reports no issues Vision Screening Comments: Goes to [facility]  Dietary issues and exercise activities discussed: Current Exercise Habits: Structured exercise class, Type of exercise: Other - see comments(swimming), Time (Minutes): 30, Frequency (Times/Week): 2, Weekly Exercise (Minutes/Week): 60, Exercise limited by: None identified  Goals    None     Depression Screen PHQ 2/9 Scores 01/07/2017 05/10/2014 04/05/2014 07/27/2013  PHQ - 2 Score 0 0 0 0    Fall Risk Fall Risk  01/07/2017 11/18/2015 06/06/2015 06/06/2015 05/10/2014  Falls in the past year? No No No No No  Number falls  in past yr: - - - - -    Cognitive Function:        Screening Tests Health Maintenance  Topic Date Due  . TETANUS/TDAP  08/07/2023  . INFLUENZA VACCINE  Completed  . DEXA SCAN  Completed  . PNA vac Low Risk Adult  Completed      Plan:    I have personally reviewed and addressed the Medicare Annual Wellness questionnaire and have noted the following in the patient's chart:  A. Medical and social history B. Use of alcohol, tobacco or illicit drugs  C. Current medications and supplements D. Functional ability and status E.  Nutritional status F.  Physical activity G. Advance directives H. List of other physicians I.  Hospitalizations, surgeries, and ER visits in previous 12 months J.  [place] to include hearing, vision, cognitive, depression L. Referrals and appointments - none  In addition, I have reviewed and discussed with patient certain preventive protocols, quality metrics, and  best practice recommendations. A written personalized care plan for preventive services as well as general preventive health recommendations were provided to patient.  See attached scanned questionnaire for additional information.   Signed,   Rich Reining, RN Nurse Health Advisor   Quick Notes   Health Maintenance: Shingrix prescription sent to pharmacy     Abnormal Screen: MMSE 25/30. Passed clock drawing     Patient Concerns: None     Nurse Concerns: None

## 2017-01-12 ENCOUNTER — Other Ambulatory Visit: Payer: Self-pay | Admitting: Internal Medicine

## 2017-01-12 ENCOUNTER — Encounter: Payer: Self-pay | Admitting: Internal Medicine

## 2017-01-12 ENCOUNTER — Non-Acute Institutional Stay: Payer: Medicare Other | Admitting: Internal Medicine

## 2017-01-12 VITALS — BP 110/70 | HR 86 | Temp 97.4°F | Resp 18 | Ht 62.0 in | Wt 153.6 lb

## 2017-01-12 DIAGNOSIS — I872 Venous insufficiency (chronic) (peripheral): Secondary | ICD-10-CM | POA: Diagnosis not present

## 2017-01-12 DIAGNOSIS — R4189 Other symptoms and signs involving cognitive functions and awareness: Secondary | ICD-10-CM | POA: Diagnosis not present

## 2017-01-12 DIAGNOSIS — N3281 Overactive bladder: Secondary | ICD-10-CM

## 2017-01-12 DIAGNOSIS — E782 Mixed hyperlipidemia: Secondary | ICD-10-CM | POA: Diagnosis not present

## 2017-01-12 DIAGNOSIS — I10 Essential (primary) hypertension: Secondary | ICD-10-CM | POA: Diagnosis not present

## 2017-01-12 DIAGNOSIS — I480 Paroxysmal atrial fibrillation: Secondary | ICD-10-CM

## 2017-01-12 DIAGNOSIS — F5104 Psychophysiologic insomnia: Secondary | ICD-10-CM | POA: Diagnosis not present

## 2017-01-12 DIAGNOSIS — C480 Malignant neoplasm of retroperitoneum: Secondary | ICD-10-CM

## 2017-01-12 DIAGNOSIS — R0600 Dyspnea, unspecified: Secondary | ICD-10-CM

## 2017-01-12 NOTE — Patient Instructions (Signed)
   You will get a call from our office with appointment for chest xray and echocardiogram (ultrasound of heart) to find out why you have trouble breathing.  Please take your medications as prescribed. Continue to take your furosemide (lasix) daily for now.  Cut down on your salt intake.

## 2017-01-12 NOTE — Progress Notes (Signed)
[facility]  Provider: Blanchie Serve MD   Location:  [facility] Service:  Clinic (12)  PCP: Mast, Man X, NP Patient Care Team: Mast, Man X, NP as PCP - General (Internal Medicine) Mast, Man X, NP as Nurse Practitioner (Nurse Practitioner) Bo Merino, MD as Consulting Physician (Rheumatology) Curly Rim, MD as Consulting Physician (Internal Medicine) Ernst Spell, MD as Referring Physician (Urology) Charlotte Crumb, MD as Consulting Physician (Orthopedic Surgery) Festus Aloe, MD as Consulting Physician (Urology)  Extended Emergency Contact Information Primary Emergency Contact: [facility] Address: [address]          [place], [place] [address] [name] of [place] Phone: [phone] Mobile Phone: [phone] Relation: Son Secondary Emergency Contact: Alessandra Grout [place] of [place] Phone: [phone] Relation: Other   Goals of Care: Advanced Directive information Advanced Directives 01/07/2017  Does Patient Have a Medical Advance Directive? Yes  Type of Advance Directive [place]  Does patient want to make changes to medical advance directive? No - Patient declined  Copy of [place] in Chart? Yes      Chief Complaint  Patient presents with  . Medical Management of Chronic Issues    routine f/u visit    HPI: Patient is a 83 y.o. female seen today for routine visit.  On chart review, ED visit on 10/17/16 for itching thought to be from contact dermatitis. She received steroid cream and atarax and symptoms have now resolved. Not requiring hydroxyzine anymore.   Dyspnea- ongoing for almost a year. Has history of pulmonary hypertension per notes. No echocardiogram to review at present.   OAB- currently on mirabegron 25 mg daily, follows with urology. Wakes up 2 times at night to urinate. Wears a pad all the time with some  incontinence. Denies dysuria.  afib- denies palpitation. Currently on apixaban 2.5 mg bid. No bleeding reported. Currently on diltiazem 120 mg daily. Has not been taking her metoprolol for several months.   Hypertension- currently on losartan 100 mg daily and furosemide 20 mg daily.   History of retroperitoneal liposarcoma- s/p resection. CT scan 07/10/16 result reviewed. No current evidence of residual or metastatic disease.   Chronic insomnia- currently on melatonin.   Anxiety- controlled. Has not required any lorazepam for more than a year.   Past Medical History:  Diagnosis Date  . Abnormality of gait 12/30/2015  . Arthritis   . Asthma   . Atrial fibrillation ([place])   . DD (diverticular disease) 10/26/2013  . 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 Diverticulitis   . Edema 12/30/2015  . Gout   . Gout 12/30/2015  . History of CVA (cerebrovascular accident)    Left basal ganglia   . Hyperlipemia   . Hypertension   . Liposarcoma ([place]) 12/20/2015   excised 12/20/15  . Mitral valve regurgitation   . Mitral valve regurgitation 12/30/2015  . OP (osteoporosis) 10/26/2013  . PAF (paroxysmal atrial fibrillation) ([place]) 07/28/2013  . Pseudogout of knee 07/28/2013   03/16/13 Uric acid 5.5   . Spondylosis of cervical joint 12/30/2015  . Spondylosis of cervical spine   . TI (tricuspid incompetence) 05/18/2011   Overview:  Moderate severe   . Unspecified vitamin D deficiency 07/28/2013   03/16/13 Vit D 24.6   . Urinary incontinence without sensory awareness 07/28/2013  . Venous (peripheral) insufficiency 02/22/2014   Past Surgical History:  Procedure Laterality Date  . COLONOSCOPY  2011   Dr. Cindie Laroche Jule Ser)  . remove fatty tumor  2017  . THUMB ARTHROSCOPY Right 2014   Dr. Burney Gauze  . TONSILLECTOMY  1938    reports that she quit smoking about 37 years ago. she has never used smokeless tobacco. She reports that she does not drink alcohol or use drugs. Social History    Socioeconomic History  . Marital status: Widowed    Spouse name: Not on file  . Number of children: Not on file  . Years of education: Not on file  . Highest education level: Not on file  Social Needs  . Financial resource strain: Not hard at all  . Food insecurity - worry: Never true  . Food insecurity - inability: Never true  . Transportation needs - medical: No  . Transportation needs - non-medical: No  Occupational History  . Occupation: retired Licensed conveyancer   Tobacco Use  . Smoking status: Former Smoker    Last attempt to quit: 07/28/1979    Years since quitting: 37.4  . Smokeless tobacco: Never Used  Substance and Sexual Activity  . Alcohol use: No    Comment: Has not drank in months.   . Drug use: No  . Sexual activity: No  Other Topics Concern  . Not on file  Social History Narrative   L[facility] at [facility] since 2013   Widowed   Former smoker, stopped 1981   Alcohol one glass of wine couple nights a week   Exercise pool exercise 3 times a week   POA             Functional Status Survey:    Family History  Problem Relation Age of Onset  . Alzheimer's disease Mother   . Heart disease Father   . Heart disease Brother     Health Maintenance  Topic Date Due  . TETANUS/TDAP  08/07/2023  . INFLUENZA VACCINE  Completed  . DEXA SCAN  Completed  . PNA vac Low Risk Adult  Completed    Allergies  Allergen Reactions  . Ace Inhibitors Cough    Cough  . Codeine Nausea And Vomiting  . Penicillins Hives and Rash  . Sulfa Antibiotics Hives and Rash    Outpatient Encounter Medications as of 01/12/2017  Medication Sig  . apixaban (ELIQUIS) 2.5 MG TABS tablet Take 2.5 mg by mouth 2 (two) times daily.  Marland Kitchen diltiazem (CARDIZEM CD) 120 MG 24 hr capsule TAKE 1 CAPSULE IN THE MORNING ON AN EMPTY STOMACH.  . furosemide (LASIX) 20 MG tablet Take 20 mg by mouth daily.  Marland Kitchen losartan (COZAAR) 100 MG tablet TAKE 1 TABLET DAILY TO CONTROL BLOOD PRESSURE.  .  Melatonin 5 MG TABS Take 1 tablet by mouth at bedtime.  . mirabegron ER (MYRBETRIQ) 25 MG TB24 tablet Take 25 mg by mouth daily.  . Misc Natural Products (BLACK CHERRY CONCENTRATE PO) Take 2 tablets by mouth daily.  . Multiple Vitamins-Minerals (CENTRUM SILVER ADULT 50+ PO) Take 1 tablet by mouth every morning.   . Omega 3 1200 MG CAPS Take 1 capsule by mouth 2 (two) times daily.   . [DISCONTINUED] hydrOXYzine (ATARAX/VISTARIL) 10 MG tablet Take 1 tablet (10 mg total) by mouth every 6 (six) hours. (Patient not taking: Reported on 01/12/2017)  . [DISCONTINUED] LORazepam (ATIVAN) 0.5 MG tablet Take by mouth 2 (two) times daily as needed for anxiety. Take 1/2 to 1 tablet   No facility-administered encounter medications on file as of 01/12/2017.  Review of Systems  Constitutional: Negative for appetite change, chills and fever.  HENT: Positive for rhinorrhea. Negative for congestion, hearing loss, mouth sores, sinus pressure, sinus pain, sore throat and trouble swallowing.   Eyes: Positive for visual disturbance. Negative for pain and redness.       Wears corrective glasses  Respiratory: Positive for shortness of breath. Negative for cough, choking and wheezing.        Shortness of breath with exertion  And will slow her down but does not have to stop to catch her breath. Been a smoker in the past.  Cardiovascular: Positive for leg swelling. Negative for chest pain and palpitations.  Gastrointestinal: Negative for abdominal pain, constipation, diarrhea, nausea and vomiting.  Genitourinary: Positive for frequency. Negative for dysuria and pelvic pain.  Musculoskeletal: Positive for gait problem. Negative for arthralgias and back pain.       Uses a walker at times, no fall reported.  Skin: Negative for rash and wound.  Neurological: Negative for dizziness, tremors, syncope and headaches.  Hematological: Does not bruise/bleed easily.  Psychiatric/Behavioral: Positive for sleep disturbance.  Negative for behavioral problems, confusion and dysphoric mood.    Vitals:   01/12/17 0825  BP: 110/70  Pulse: 86  Resp: 18  Temp: (!) 97.4 F (36.3 C)  SpO2: 95%  Weight: 153 lb 9.6 oz (69.7 kg)  Height: 5' 2"  (1.575 m)   Body mass index is 28.09 kg/m.   Wt Readings from Last 3 Encounters:  01/12/17 153 lb 9.6 oz (69.7 kg)  01/07/17 153 lb (69.4 kg)  10/17/16 145 lb (65.8 kg)   Physical Exam  Constitutional: She appears well-developed and well-nourished. No distress.  HENT:  Head: Normocephalic and atraumatic.  Mouth/Throat: Oropharynx is clear and moist. No oropharyngeal exudate.  Eyes: Conjunctivae and EOM are normal. Pupils are equal, round, and reactive to light.  Has corrective glasses  Neck: Normal range of motion. Neck supple. No thyromegaly present.  Cardiovascular: Intact distal pulses.  No murmur heard. Irregular heart rate   Pulmonary/Chest: Effort normal and breath sounds normal. No respiratory distress. She has no wheezes. She has no rales.  Abdominal: Soft. Bowel sounds are normal. There is no tenderness. There is no guarding.  Musculoskeletal: She exhibits edema and deformity. She exhibits no tenderness.  Trace leg edema  Lymphadenopathy:    She has no cervical adenopathy.  Neurological: She is alert.  Alert and oriented to self, place and time.   01/07/17 MMSE 25/30  Skin: Skin is warm and dry. She is not diaphoretic.  Chronic venous stasis skin changes, no breakdown  Psychiatric: She has a normal mood and affect.    Labs reviewed: Basic Metabolic Panel: Recent Labs    01/16/16 01/20/16 01/04/17 0000  NA 143 140 140  K 3.5 4.4 4.0  CL  --   --  104  CO2  --   --  27  GLUCOSE  --   --  89  BUN 10 15 19   CREATININE 0.6 0.8 0.86  CALCIUM  --   --  9.8   Liver Function Tests: Recent Labs    01/04/17 0000  AST 22  ALT 18  BILITOT 0.7  PROT 6.1   No results for input(s): LIPASE, AMYLASE in the last 8760 hours. No results for  input(s): AMMONIA in the last 8760 hours. CBC: Recent Labs    01/04/17 0000  WBC 5.4  HGB 14.1  HCT 41.5  MCV 94.7  PLT 234  Cardiac Enzymes: No results for input(s): CKTOTAL, CKMB, CKMBINDEX, TROPONINI in the last 8760 hours. BNP: Invalid input(s): POCBNP No results found for: HGBA1C Lab Results  Component Value Date   TSH 1.30 01/04/2017   No results found for: VITAMINB12 No results found for: FOLATE No results found for: IRON, TIBC, FERRITIN  Lipid Panel: Recent Labs    01/04/17 0000  CHOL 161  HDL 69  TRIG 79  CHOLHDL 2.3   No results found for: HGBA1C  Procedures since last visit: No results found.  Assessment/Plan  1. PAF (paroxysmal atrial fibrillation) (HCC) Controlled HR. Continue diltiazem and apixaban. D/c metoprolol as pt not taking it. Reviewed notes from Dr Julious Payer office.  - CMP with eGFR; Future - Lipid Panel; Future - CBC (no diff); Future  2. Essential hypertension Continue losartan and lasix. Reviewed bmp and cbc - CMP with eGFR; Future - Lipid Panel; Future - CBC (no diff); Future  3. Venous (peripheral) insufficiency Keep legs elevated at rest, encouraged to wear ted hose, skin care - CBC (no diff); Future  4. Detrusor muscle hypertonia Continue myrbetriq  5. Chronic insomnia Continue melatonin  6. Mixed hyperlipidemia C/w fish oil - CMP with eGFR; Future - Lipid Panel; Future  7. Dyspnea, unspecified type For almost a year now. No echocardiogram or chest xray for review. Rule out CHF, pulmonary HTN and COPD changes. Clear lungs on exam today. No JVD. Continue lasix.  - ECHOCARDIOGRAM COMPLETE; Future - DG Chest 2 View; Future  8. Retroperitoneal liposarcoma [facility]) S/p resection and f/u with oncology periodically.   9. Cognitive impairment Reviewed MMSE 25/30. Has history of CVA in past. Has HTN and Afib. Likely vascular component. TSH normal. Continue BP medication and fish oil for now. LDL close to goal     Labs/tests ordered:   Lab Orders     CMP with eGFR     Lipid Panel     CBC (no diff) Next appointment: 6 months with [facility]  Communication: reviewed care plan with patient   Blanchie Serve, MD Internal Medicine [place], [place] [address] Cell Phone (Monday-Friday 8 am - 5 pm): [phone] On Call: [phone] and follow prompts after 5 pm and on weekends Office Phone: [phone] Office Fax: [phone]

## 2017-01-20 ENCOUNTER — Other Ambulatory Visit (HOSPITAL_COMMUNITY): Payer: Medicare Other

## 2017-01-29 ENCOUNTER — Other Ambulatory Visit: Payer: Self-pay

## 2017-01-29 ENCOUNTER — Ambulatory Visit (HOSPITAL_COMMUNITY): Payer: Medicare Other | Attending: Internal Medicine

## 2017-01-29 DIAGNOSIS — Z8673 Personal history of transient ischemic attack (TIA), and cerebral infarction without residual deficits: Secondary | ICD-10-CM | POA: Diagnosis not present

## 2017-01-29 DIAGNOSIS — I071 Rheumatic tricuspid insufficiency: Secondary | ICD-10-CM | POA: Insufficient documentation

## 2017-01-29 DIAGNOSIS — R0600 Dyspnea, unspecified: Secondary | ICD-10-CM | POA: Insufficient documentation

## 2017-01-29 DIAGNOSIS — I272 Pulmonary hypertension, unspecified: Secondary | ICD-10-CM | POA: Diagnosis not present

## 2017-01-29 DIAGNOSIS — I10 Essential (primary) hypertension: Secondary | ICD-10-CM | POA: Diagnosis not present

## 2017-01-29 DIAGNOSIS — E785 Hyperlipidemia, unspecified: Secondary | ICD-10-CM | POA: Insufficient documentation

## 2017-01-29 DIAGNOSIS — I4891 Unspecified atrial fibrillation: Secondary | ICD-10-CM | POA: Diagnosis not present

## 2017-02-03 ENCOUNTER — Other Ambulatory Visit: Payer: Self-pay | Admitting: *Deleted

## 2017-02-03 DIAGNOSIS — E782 Mixed hyperlipidemia: Secondary | ICD-10-CM

## 2017-02-03 DIAGNOSIS — I10 Essential (primary) hypertension: Secondary | ICD-10-CM

## 2017-02-03 DIAGNOSIS — I872 Venous insufficiency (chronic) (peripheral): Secondary | ICD-10-CM

## 2017-02-04 LAB — COMPLETE METABOLIC PANEL WITH GFR
AG Ratio: 1.8 (calc) (ref 1.0–2.5)
ALT: 23 U/L (ref 6–29)
AST: 24 U/L (ref 10–35)
Albumin: 3.9 g/dL (ref 3.6–5.1)
Alkaline phosphatase (APISO): 70 U/L (ref 33–130)
BUN: 22 mg/dL (ref 7–25)
CALCIUM: 9.8 mg/dL (ref 8.6–10.4)
CO2: 32 mmol/L (ref 20–32)
CREATININE: 0.76 mg/dL (ref 0.60–0.88)
Chloride: 104 mmol/L (ref 98–110)
GFR, EST AFRICAN AMERICAN: 83 mL/min/{1.73_m2} (ref >=60)
GFR, EST NON AFRICAN AMERICAN: 72 mL/min/{1.73_m2} (ref >=60)
GLUCOSE: 92 mg/dL (ref 65–99)
Globulin: 2.2 g/dL (calc) (ref 1.9–3.7)
Potassium: 4.1 mmol/L (ref 3.5–5.3)
Sodium: 140 mmol/L (ref 135–146)
TOTAL PROTEIN: 6.1 g/dL (ref 6.1–8.1)
Total Bilirubin: 0.5 mg/dL (ref 0.2–1.2)

## 2017-02-04 LAB — CBC
HCT: 37.6 % (ref 35.0–45.0)
Hemoglobin: 13.2 g/dL (ref 11.7–15.5)
MCH: 32 pg (ref 27.0–33.0)
MCHC: 35.1 g/dL (ref 32.0–36.0)
MCV: 91.3 fL (ref 80.0–100.0)
MPV: 8.7 fL (ref 7.5–12.5)
PLATELETS: 246 10*3/uL (ref 140–400)
RBC: 4.12 10*6/uL (ref 3.80–5.10)
RDW: 12.5 % (ref 11.0–15.0)
WBC: 5 10*3/uL (ref 3.8–10.8)

## 2017-02-04 LAB — LIPID PANEL
Cholesterol: 150 mg/dL (ref <200)
HDL: 72 mg/dL (ref >50)
LDL Cholesterol (Calc): 65 mg/dL (calc)
NON-HDL CHOLESTEROL (CALC): 78 mg/dL (ref <130)
Total CHOL/HDL Ratio: 2.1 (calc) (ref <5.0)
Triglycerides: 54 mg/dL (ref <150)

## 2017-02-19 ENCOUNTER — Other Ambulatory Visit: Payer: Self-pay | Admitting: *Deleted

## 2017-02-19 MED ORDER — APIXABAN 2.5 MG PO TABS: 2.5000 mg | ORAL_TABLET | Freq: Two times a day (BID) | ORAL | 1 refills | Status: DC

## 2017-02-19 NOTE — Telephone Encounter (Signed)
[facility]  

## 2017-03-04 IMAGING — CT CT ABD-PELV W/ CM
3 of 5 series · 13 of 36 positions shown, 19 images · IV contrast (READICAT/WATER & [ID] ISOVUE 300)
Comparison: None.

CLINICAL DATA: Right lower quadrant mass.

EXAM:
CT ABDOMEN AND PELVIS WITH CONTRAST
TECHNIQUE: Multidetector CT imaging of the abdomen and pelvis was performed
using the standard protocol following bolus administration of
intravenous contrast.
CONTRAST:  100mL XHXEBA-FUU IOPAMIDOL (XHXEBA-FUU) INJECTION 61%

[Series 3: abd/pelvis with · axial · 0.82mm/px · z∈[-356,-11]mm · 8 of 89 slices shown, 13 images]
[im 10/89  soft-tissue]
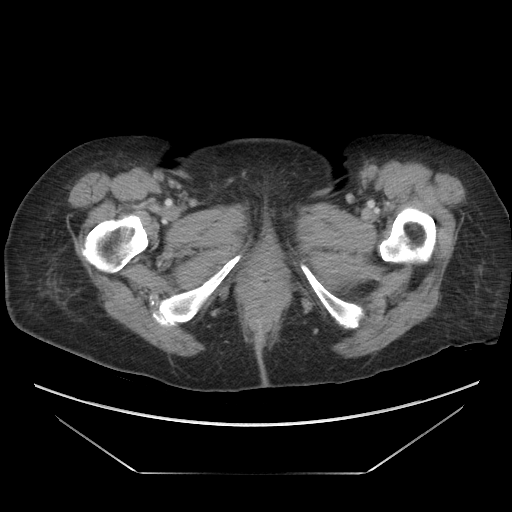
[im 10/89  bone]
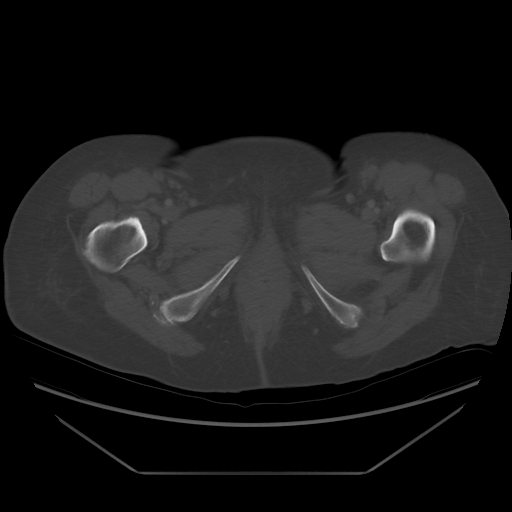
[im 20/89  soft-tissue]
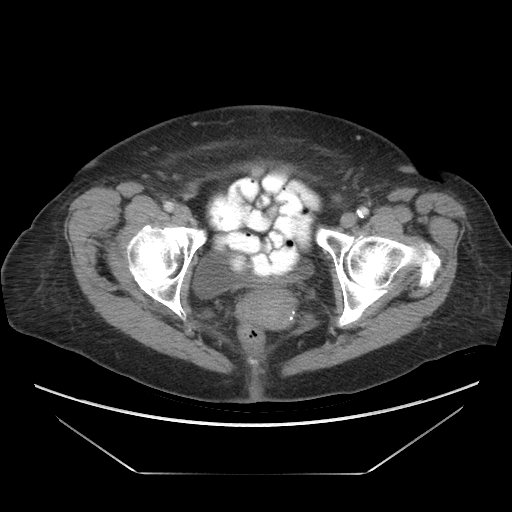
[im 30/89  soft-tissue]
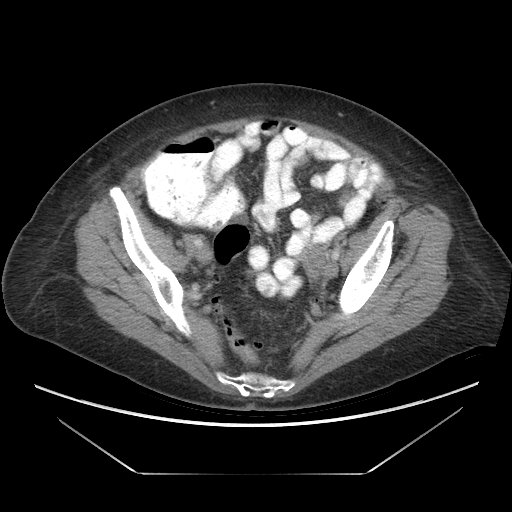
[im 40/89  soft-tissue]
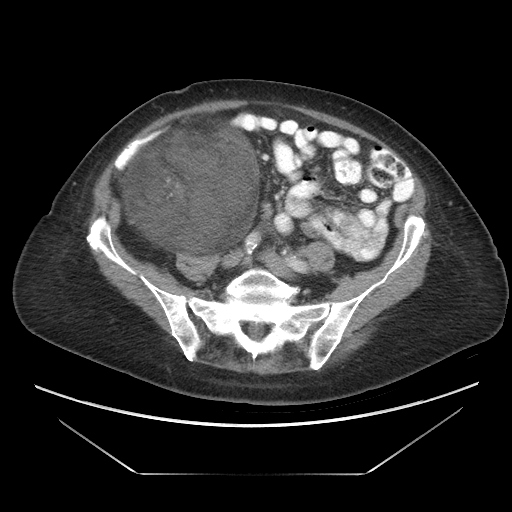
[im 49/89  soft-tissue]
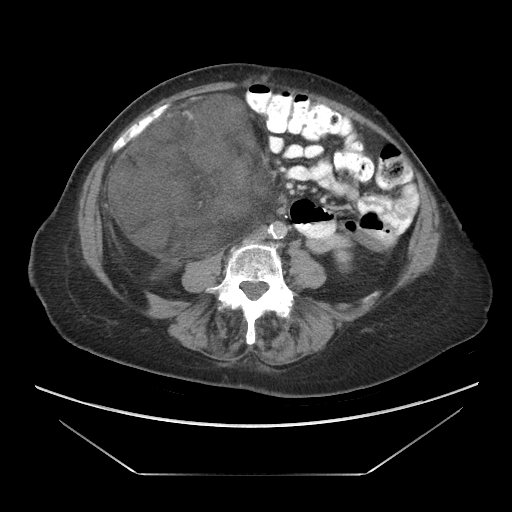
[im 49/89  lung]
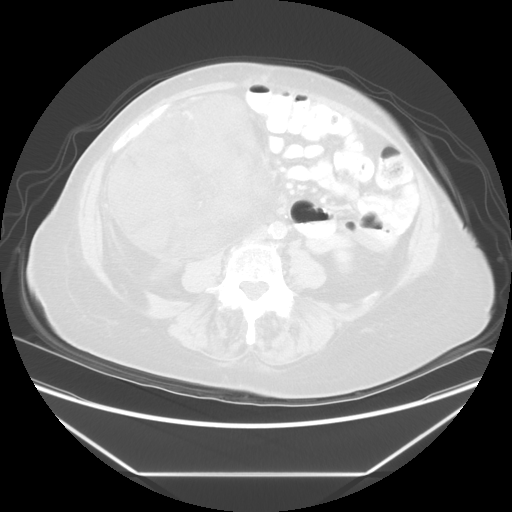
[im 59/89  soft-tissue]
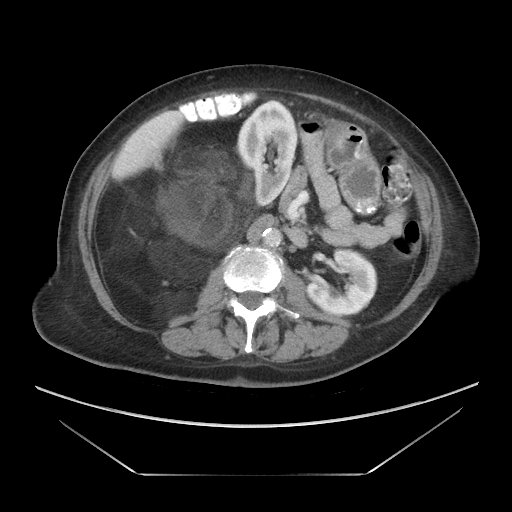
[im 59/89  lung]
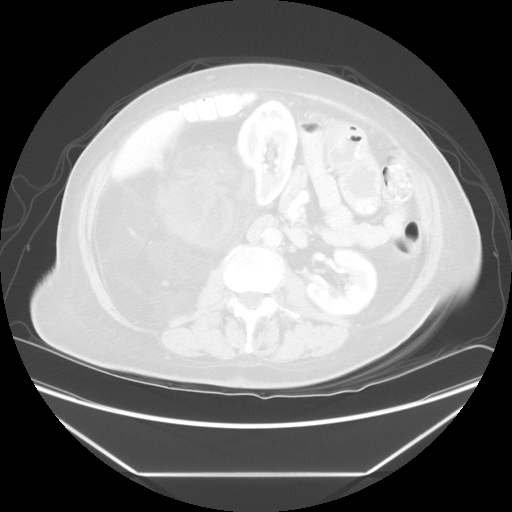
[im 69/89  soft-tissue]
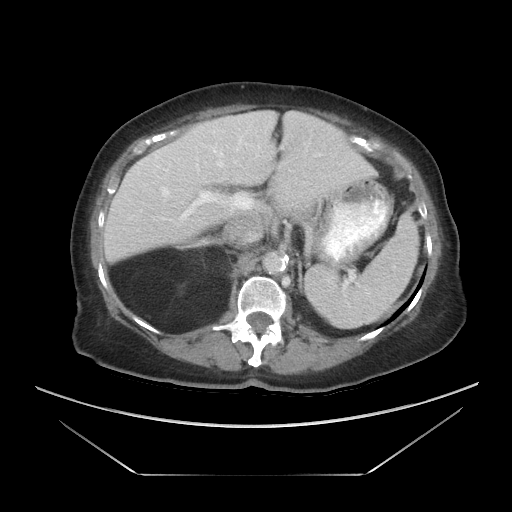
[im 69/89  lung]
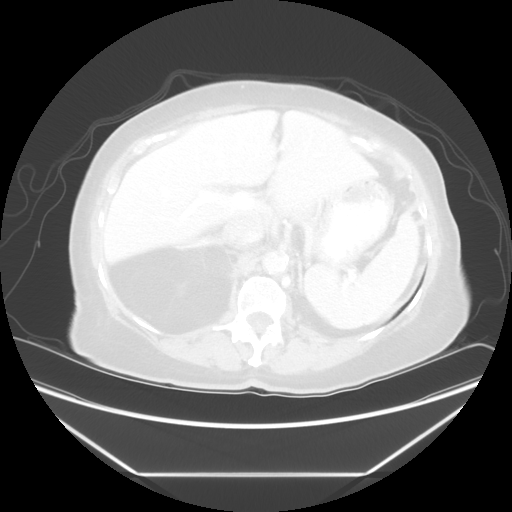
[im 79/89  soft-tissue]
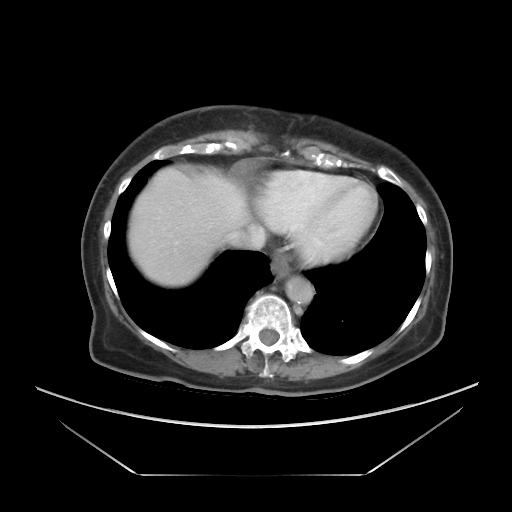
[im 79/89  lung]
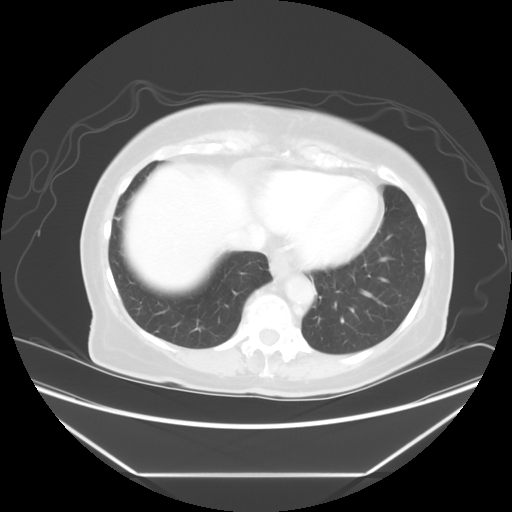

[Series 601: coronal body · coronal · 0.86mm/px · 1 of 118 slices shown, 2 images]
[im 40/118  soft-tissue]
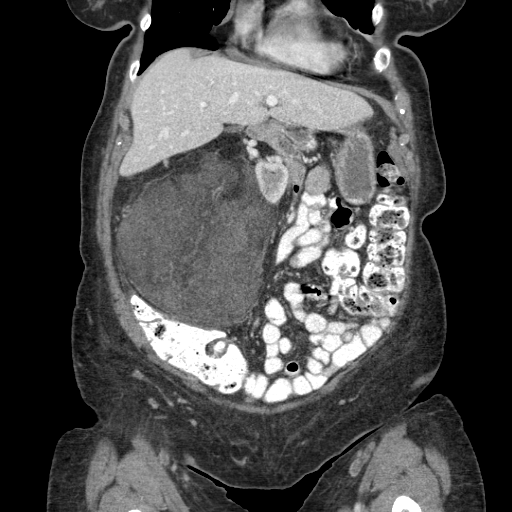
[im 40/118  bone]
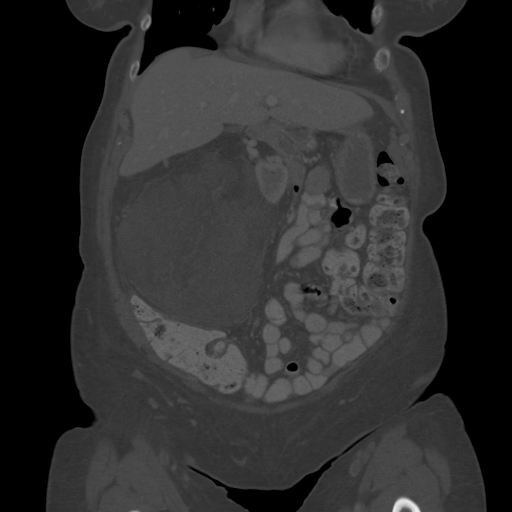

[Series 602: sagittal body · sagittal · 0.86mm/px · 4 of 169 slices shown]
[im 11/169  soft-tissue]
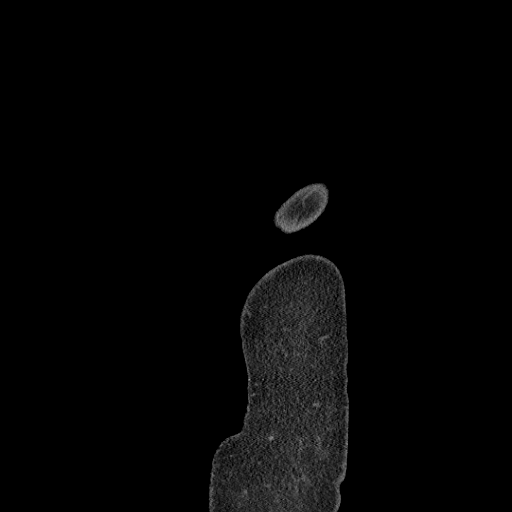
[im 32/169  soft-tissue]
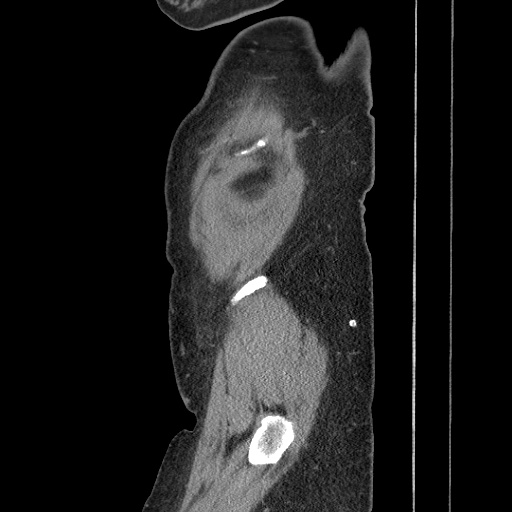
[im 53/169  soft-tissue]
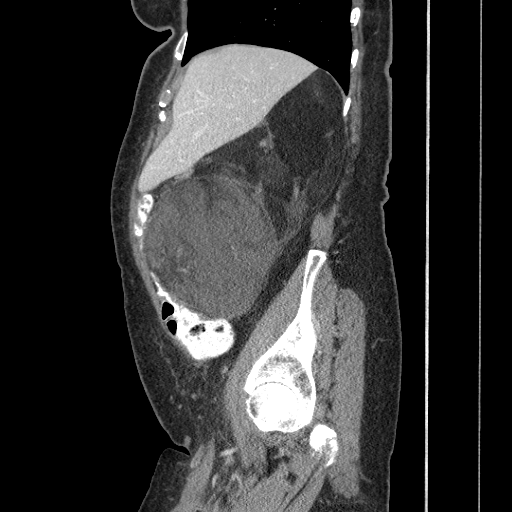
[im 74/169  soft-tissue]
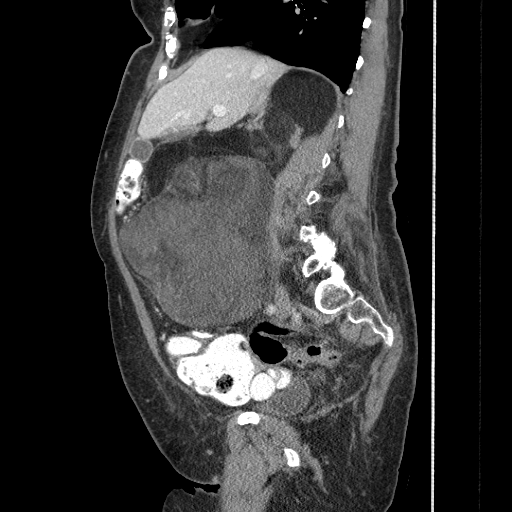

[13 of 36 positions shown; findings below may reference images not displayed]

FINDINGS: Lower chest:  No pulmonary nodules in the basilar lungs.

Hepatobiliary: No focal liver abnormality.No evidence of biliary
obstruction or stone.

Pancreas: Unremarkable.

Spleen: Unremarkable.

Adrenals/Urinary Tract: Displaced right adrenal and kidney
retroperitoneal mass. Symmetric renal enhancement. No
hydronephrosis. Unremarkable bladder.

Stomach/Bowel: Displace bowel and compressed ascending colon. No
obstruction. Sigmoid diverticulosis

Vascular/Lymphatic: No acute vascular abnormality. No mass or
adenopathy.

Reproductive:Negative

Other: Large right retroperitoneal mass with fatty density, soft
tissue density, and multiple vascularized septations. The mass
displaces right abdominal viscera and measures up to 25 x 18 x 14
cm.

Musculoskeletal: No acute or aggressive finding. Lower lumbar facet
arthropathy with slight L4-5 anterolisthesis.
IMPRESSION: Right retroperitoneal liposarcoma measuring up to 25 cm and
significantly displacing abdominal viscera.

## 2017-03-11 ENCOUNTER — Encounter (HOSPITAL_COMMUNITY): Payer: Self-pay | Admitting: Emergency Medicine

## 2017-03-11 ENCOUNTER — Emergency Department (HOSPITAL_COMMUNITY): Payer: Medicare Other

## 2017-03-11 ENCOUNTER — Emergency Department (HOSPITAL_COMMUNITY)
Admission: EM | Admit: 2017-03-11 | Discharge: 2017-03-11 | Disposition: A | Discharge status: Home / Self Care | Payer: Medicare Other | Attending: Emergency Medicine | Admitting: Emergency Medicine

## 2017-03-11 DIAGNOSIS — Z8673 Personal history of transient ischemic attack (TIA), and cerebral infarction without residual deficits: Secondary | ICD-10-CM | POA: Insufficient documentation

## 2017-03-11 DIAGNOSIS — R0602 Shortness of breath: Secondary | ICD-10-CM | POA: Insufficient documentation

## 2017-03-11 DIAGNOSIS — J45909 Unspecified asthma, uncomplicated: Secondary | ICD-10-CM | POA: Diagnosis not present

## 2017-03-11 DIAGNOSIS — R0789 Other chest pain: Secondary | ICD-10-CM | POA: Diagnosis not present

## 2017-03-11 DIAGNOSIS — Z87891 Personal history of nicotine dependence: Secondary | ICD-10-CM | POA: Insufficient documentation

## 2017-03-11 DIAGNOSIS — I480 Paroxysmal atrial fibrillation: Secondary | ICD-10-CM | POA: Insufficient documentation

## 2017-03-11 DIAGNOSIS — R079 Chest pain, unspecified: Secondary | ICD-10-CM

## 2017-03-11 DIAGNOSIS — I10 Essential (primary) hypertension: Secondary | ICD-10-CM | POA: Diagnosis not present

## 2017-03-11 DIAGNOSIS — Z79899 Other long term (current) drug therapy: Secondary | ICD-10-CM | POA: Insufficient documentation

## 2017-03-11 DIAGNOSIS — Z7901 Long term (current) use of anticoagulants: Secondary | ICD-10-CM | POA: Insufficient documentation

## 2017-03-11 LAB — COMPREHENSIVE METABOLIC PANEL
ALK PHOS: 71 U/L (ref 38–126)
ALT: 25 U/L (ref 14–54)
AST: 28 U/L (ref 15–41)
Albumin: 3.5 g/dL (ref 3.5–5.0)
Anion gap: 9 (ref 5–15)
BUN: 15 mg/dL (ref 6–20)
CALCIUM: 9.8 mg/dL (ref 8.9–10.3)
CHLORIDE: 104 mmol/L (ref 101–111)
CO2: 29 mmol/L (ref 22–32)
CREATININE: 0.73 mg/dL (ref 0.44–1.00)
GFR calc Af Amer: >60 mL/min (ref >60)
Glucose, Bld: 107 mg/dL — ABNORMAL HIGH (ref 65–99)
Potassium: 3.8 mmol/L (ref 3.5–5.1)
SODIUM: 142 mmol/L (ref 135–145)
Total Bilirubin: 1 mg/dL (ref 0.3–1.2)
Total Protein: 6.2 g/dL — ABNORMAL LOW (ref 6.5–8.1)

## 2017-03-11 LAB — I-STAT TROPONIN, ED
Troponin i, poc: 0 ng/mL (ref 0.00–0.08)
Troponin i, poc: 0 ng/mL (ref 0.00–0.08)

## 2017-03-11 LAB — CBC
HCT: 41.4 % (ref 36.0–46.0)
HEMOGLOBIN: 13.6 g/dL (ref 12.0–15.0)
MCH: 32.4 pg (ref 26.0–34.0)
MCHC: 32.9 g/dL (ref 30.0–36.0)
MCV: 98.6 fL (ref 78.0–100.0)
PLATELETS: 204 10*3/uL (ref 150–400)
RBC: 4.2 MIL/uL (ref 3.87–5.11)
RDW: 13.4 % (ref 11.5–15.5)
WBC: 4.5 10*3/uL (ref 4.0–10.5)

## 2017-03-11 MED ORDER — ASPIRIN 81 MG PO CHEW
324.0000 mg | CHEWABLE_TABLET | Freq: Once | ORAL | Status: AC
  Administered 2017-03-11: 324 mg via ORAL
  Filled 2017-03-11: qty 4

## 2017-03-11 NOTE — ED Notes (Signed)
Pt updated; coffee given. No needs.

## 2017-03-11 NOTE — ED Notes (Signed)
Heart healthy diet ordered. 

## 2017-03-11 NOTE — ED Notes (Signed)
ED Provider at bedside. 

## 2017-03-11 NOTE — Discharge Instructions (Signed)

## 2017-03-11 NOTE — ED Triage Notes (Addendum)
Chest pressure woke her up in when trying to go to bed; sat up and felt better; staff applied oxygen and she said also helped. Staff called EMS this morning. Currently no pressure and no complaints. Cough productive. Recent report of cold symptoms.

## 2017-03-11 NOTE — ED Notes (Signed)
Son called and updated with info.

## 2017-03-11 NOTE — ED Provider Notes (Signed)
[facility] EMERGENCY DEPARTMENT Provider Note  CSN: [id number] Arrival date & time: 03/11/17 7322  Chief Complaint(s) No chief complaint on file.  HPI Diana Petersen is a 84 y.o. female with a history of hypertension, hyperlipidemia, A. fib on diltiazem and Eliquis who presents with nonradiating, substernal chest tightness that began last night at approximately 2200 p.m. while lying down to go to bed.  Patient reports that the chest pain resolved after sitting up, thus she remained sitting up the rest of the night.  No other alleviating or aggravating factors.  Her pain did not recur.  She had associated shortness of breath due to discomfort.  Denied any nausea or vomiting.  Denies any abdominal pain.  No recent fevers or infections.  This morning she went to the front desk of the facility where she lives and asked to speak to the doctor due to the discomfort that she had overnight, they sent her here for evaluation.  HPI  Past Medical History Past Medical History:  Diagnosis Date  . Abnormality of gait 12/30/2015  . Arthritis   . Asthma   . Atrial fibrillation ([place])   . DD (diverticular disease) 10/26/2013  . 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 Diverticulitis   . Edema 12/30/2015  . Gout   . Gout 12/30/2015  . History of CVA (cerebrovascular accident)    Left basal ganglia   . Hyperlipemia   . Hypertension   . Liposarcoma ([place]) 12/20/2015   excised 12/20/15  . Mitral valve regurgitation   . Mitral valve regurgitation 12/30/2015  . OP (osteoporosis) 10/26/2013  . PAF (paroxysmal atrial fibrillation) ([place]) 07/28/2013  . Pseudogout of knee 07/28/2013   03/16/13 Uric acid 5.5   . Spondylosis of cervical joint 12/30/2015  . Spondylosis of cervical spine   . TI (tricuspid incompetence) 05/18/2011   Overview:  Moderate severe   . Unspecified vitamin D deficiency 07/28/2013   03/16/13 Vit D 24.6   . Urinary incontinence without sensory awareness 07/28/2013  . Venous  (peripheral) insufficiency 02/22/2014   Patient Active Problem List   Diagnosis Date Noted  . Cognitive impairment 01/12/2017  . 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 Dyspnea 01/12/2017  . Depression with anxiety 02/26/2016  . Blood loss anemia 02/06/2016  . Pulmonary hypertension ([place]) 01/08/2016  . Anticoagulated 12/30/2015  . UTI (urinary tract infection) 12/30/2015  . Edema 12/30/2015  . Abnormality of gait 12/30/2015  . Diverticulosis of colon without hemorrhage 12/30/2015  . Gout 12/30/2015  . Spondylosis of cervical joint 12/30/2015  . Chronic insomnia 05/10/2014  . Venous (peripheral) insufficiency 02/22/2014  . History of CVA (cerebrovascular accident) 12/25/2013  . OP (osteoporosis) 10/26/2013  . 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 DD (diverticular disease) 10/26/2013  . HTN (hypertension) 07/28/2013  . PAF (paroxysmal atrial fibrillation) ([place]) 07/28/2013  . HX: anticoagulation 07/28/2013  . Urinary incontinence without sensory awareness 07/28/2013  . Pseudogout of knee 07/28/2013  . Unspecified vitamin D deficiency 07/28/2013  . Hyperlipidemia 07/28/2013  . TI (tricuspid incompetence) 05/18/2011   Home Medication(s) Prior to Admission medications   Medication Sig Start Date End Date Taking? Authorizing Provider  apixaban (ELIQUIS) 2.5 MG TABS tablet Take 1 tablet (2.5 mg total) by mouth 2 (two) times daily. 02/19/17  Yes Mast, Man X, NP  diltiazem (CARDIZEM CD) 120 MG 24 hr capsule TAKE 1 CAPSULE DAILY. 01/12/17  Yes Blanchie Serve, MD  furosemide (LASIX) 20 MG tablet Take 20 mg by mouth daily.   Yes [provider]  losartan (COZAAR) 100 MG tablet TAKE 1 TABLET DAILY TO CONTROL BLOOD PRESSURE. 10/22/15  Yes Estill Dooms, MD  Melatonin 5 MG TABS Take 1 tablet by mouth at bedtime.   Yes [provider]  mirabegron ER (MYRBETRIQ) 25 MG TB24 tablet Take 25 mg by mouth daily.   Yes [provider]  Misc Natural Products (BLACK  CHERRY CONCENTRATE PO) Take 2 tablets by mouth daily.   Yes [provider]  Multiple Vitamins-Minerals (CENTRUM SILVER ADULT 50+ PO) Take 1 tablet by mouth every morning.    Yes [provider]  Omega 3 1200 MG CAPS Take 1 capsule by mouth 2 (two) times daily.    Yes [provider]                                                                                                                                    Past Surgical History Past Surgical History:  Procedure Laterality Date  . COLONOSCOPY  2011   Dr. Cindie Laroche Jule Ser)  . remove fatty tumor  2017  . THUMB ARTHROSCOPY Right 2014   Dr. Burney Gauze  . TONSILLECTOMY  1938   Family History Family History  Problem Relation Age of Onset  . Alzheimer's disease Mother   . Heart disease Father   . Heart disease Brother     Social History Social History   Tobacco Use  . Smoking status: Former Smoker    Last attempt to quit: 07/28/1979    Years since quitting: 37.6  . Smokeless tobacco: Never Used  Substance Use Topics  . Alcohol use: No    Comment: Has not drank in months.   . Drug use: No   Allergies Ace inhibitors; Codeine; Penicillins; and Sulfa antibiotics  Review of Systems Review of Systems All other systems are reviewed and are negative for acute change except as noted in the HPI  Physical Exam Vital Signs  I have reviewed the triage vital signs BP (!) 126/58   Pulse 80   Temp 98 F (36.7 C) (Oral)   Resp 18   SpO2 95%   Physical Exam  Constitutional: She is oriented to person, place, and time. She appears well-developed and well-nourished. No distress.  HENT:  Head: Normocephalic and atraumatic.  Nose: Nose normal.  Eyes: Conjunctivae and EOM are normal. Pupils are equal, round, and reactive to light. Right eye exhibits no discharge. Left eye exhibits no discharge. No scleral icterus.  Neck: Normal range of motion. Neck supple.  Cardiovascular: Normal rate and regular  rhythm. Exam reveals no gallop and no friction rub.  No murmur heard. Pulmonary/Chest: Effort normal and breath sounds normal. No stridor. No respiratory distress. She has no rales.  Abdominal: Soft. She exhibits no distension. There is no tenderness.  Musculoskeletal: She exhibits no edema or tenderness.  Neurological: She is alert and oriented to person, place, and time.  Skin: Skin is warm  and dry. No rash noted. She is not diaphoretic. No erythema.  Psychiatric: She has a normal mood and affect.  Vitals reviewed.   ED Results and Treatments Labs (all labs ordered are listed, but only abnormal results are displayed) Labs Reviewed  COMPREHENSIVE METABOLIC PANEL - Abnormal; Notable for the following components:      Result Value   Glucose, Bld 107 (*)    Total Protein 6.2 (*)    All other components within normal limits  CBC  I-STAT TROPONIN, ED  I-STAT TROPONIN, ED                                                                                                                         EKG  EKG Interpretation  Date/Time:  Thursday March 11 2017 09:39:43 EST Ventricular Rate:  73 PR Interval:    QRS Duration: 105 QT Interval:  394 QTC Calculation: 435 R Axis:   71 Text Interpretation:  Sinus rhythm Atrial premature complex Probable anteroseptal infarct, old NO STEMI No old tracing to compare Confirmed by Addison Lank [phone]) on 03/11/2017 12:06:16 PM      Radiology Dg Chest 2 View  Result Date: 03/11/2017 CLINICAL DATA:  Chest pressure and productive cough. EXAM: CHEST  2 VIEW COMPARISON:  06/16/2013 FINDINGS: The cardiac silhouette is normal in size. Mild thoracic aortic tortuosity and atherosclerotic calcification are again seen. The lungs are clear. No pleural effusion or pneumothorax is identified. There is mild thoracic levoscoliosis. IMPRESSION: No active cardiopulmonary disease. Electronically Signed   By: Logan Bores M.D.   On: 03/11/2017 11:10   Pertinent labs &  imaging results that were available during my care of the patient were reviewed by me and considered in my medical decision making (see chart for details).  Medications Ordered in ED Medications  aspirin chewable tablet 324 mg (324 mg Oral Given 03/11/17 1150)                                                                                                                                    Procedures Procedures  (including critical care time)  Medical Decision Making / ED Course I have reviewed the nursing notes for this encounter and the patient's prior records (if available in EHR or on provided paperwork).    Atypical chest pain.  Inconsistent with ACS.  EKG without acute ischemic changes or evidence of pericarditis.  Given  her age will obtain cardiac enzymes to rule out ACS.  Initial troponin was negative.  Second serial troponin more than 14 hours after pain onset was negative.  Feel this is sufficient to rule out ACS.  Her presentation was not classic for aortic dissection or esophageal perforation.  Have low suspicion for pulmonary embolism.  Chest x-ray without evidence suggestive of pneumonia, pneumothorax, pneumomediastinum.  No abnormal contour of the mediastinum to suggest dissection. No evidence of acute injuries.  Patient was monitored for several hours without recurrence of her symptoms.  She was able to tolerate oral intake.  The patient appears reasonably screened and/or stabilized for discharge and I doubt any other medical condition or other [facility] requiring further screening, evaluation, or treatment in the ED at this time prior to discharge.  The patient is safe for discharge with strict return precautions.   Final Clinical Impression(s) / ED Diagnoses Final diagnoses:  Chest pain  Atypical chest pain   Disposition: Discharge  Condition: Good  I have discussed the results, Dx and Tx plan with the patient who expressed understanding and agree(s) with the plan.  Discharge instructions discussed at great length. The patient was given strict return precautions who verbalized understanding of the instructions. No further questions at time of discharge.    ED Discharge Orders    None       Follow Up: Mast, Man X, NP [address] [place] [address] [phone]  Schedule an appointment as soon as possible for a visit  As needed      This chart was dictated using voice recognition software.  Despite best efforts to proofread,  errors can occur which can change the documentation meaning.   Fatima Blank, MD 03/11/17 [phone]

## 2017-03-11 NOTE — ED Notes (Signed)
FRIENDS HOME TO PICK HER UP VIA SHUTTLE IN FRONT OF ED AT 3:00PM.

## 2017-03-16 ENCOUNTER — Encounter: Payer: Self-pay | Admitting: Nurse Practitioner

## 2017-03-16 DIAGNOSIS — R0789 Other chest pain: Secondary | ICD-10-CM | POA: Insufficient documentation

## 2017-03-24 ENCOUNTER — Ambulatory Visit: Payer: Medicare Other | Admitting: Nurse Practitioner

## 2017-03-24 ENCOUNTER — Encounter: Payer: Self-pay | Admitting: Nurse Practitioner

## 2017-03-24 VITALS — BP 140/70 | HR 89 | Temp 97.4°F | Resp 20 | Ht 62.0 in | Wt 143.2 lb

## 2017-03-24 DIAGNOSIS — R059 Cough, unspecified: Secondary | ICD-10-CM

## 2017-03-24 DIAGNOSIS — I10 Essential (primary) hypertension: Secondary | ICD-10-CM | POA: Diagnosis not present

## 2017-03-24 DIAGNOSIS — I480 Paroxysmal atrial fibrillation: Secondary | ICD-10-CM | POA: Diagnosis not present

## 2017-03-24 DIAGNOSIS — J069 Acute upper respiratory infection, unspecified: Secondary | ICD-10-CM | POA: Diagnosis not present

## 2017-03-24 DIAGNOSIS — R05 Cough: Secondary | ICD-10-CM

## 2017-03-24 MED ORDER — BENZONATATE 100 MG PO CAPS: 100.0000 mg | ORAL_CAPSULE | Freq: Three times a day (TID) | ORAL | 0 refills | Status: AC | PRN

## 2017-03-24 MED ORDER — LORATADINE 10 MG PO TABS: 10.0000 mg | ORAL_TABLET | Freq: Every day | ORAL | 0 refills | Status: DC

## 2017-03-24 MED ORDER — METHYLPREDNISOLONE 4 MG PO TBPK: ORAL_TABLET | ORAL | 0 refills | Status: DC

## 2017-03-24 NOTE — Assessment & Plan Note (Signed)
hacking, non productive cough for 2-3 days, gradual onset, denied nasal congestion, running nose, facial pressure, chest pain, SOB, palpitation. She admitted scratchy throat, less appetite, and fatigue. She is afebrile, no O2 desaturation. Will Benzonatate 100mg  tid x 5 days, Claritin 10mg  daily x 5 days, Medrol dose pack 4mg , tabs 6-5-4-3-2-1. May consider CXR, CBC/diff, CMP if no better. Observe.

## 2017-03-24 NOTE — Assessment & Plan Note (Signed)
Hx of Afib and HTN, blood pressure and heart rate are in control, continue Losartan 100mg  and Furosemide 20mg  daily.

## 2017-03-24 NOTE — Progress Notes (Addendum)
Location:   [facility]   Place of Service:   [facility]  Provider: Marlana Latus NP  Code Status: DNR Goals of Care: [place] 03/11/2017  Does Patient Have a Medical Advance Directive? No  Type of Advance Directive -  Does patient want to make changes to medical advance directive? -  Copy of [place] in Chart? -     Chief Complaint  Patient presents with  . Acute Visit    cough, congestion and SOB    HPI: Patient is a 84 y.o. female seen today for an acute visit for hacking, non productive cough for 2-3 days, gradual onset, denied nasal congestion, running nose, facial pressure, chest pain, SOB, palpitation. She admitted scratchy throat, less appetite, and fatigue. She is afebrile, no O2 desaturation.   Hx of Afib and HTN, blood pressure and heart rate are in control, on Losartan 100mg  and Furosemide 20mg  daily.   Past Medical History:  Diagnosis Date  . Abnormality of gait 12/30/2015  . Arthritis   . Asthma   . Atrial fibrillation ([place])   . DD (diverticular disease) 10/26/2013  . 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 Diverticulitis   . Edema 12/30/2015  . Gout   . Gout 12/30/2015  . History of CVA (cerebrovascular accident)    Left basal ganglia   . Hyperlipemia   . Hypertension   . Liposarcoma ([place]) 12/20/2015   excised 12/20/15  . Mitral valve regurgitation   . Mitral valve regurgitation 12/30/2015  . OP (osteoporosis) 10/26/2013  . PAF (paroxysmal atrial fibrillation) ([place]) 07/28/2013  . Pseudogout of knee 07/28/2013   03/16/13 Uric acid 5.5   . Spondylosis of cervical joint 12/30/2015  . Spondylosis of cervical spine   . TI (tricuspid incompetence) 05/18/2011   Overview:  Moderate severe   . Unspecified vitamin D deficiency 07/28/2013   03/16/13 Vit D 24.6   . Urinary incontinence without sensory awareness 07/28/2013  . Venous (peripheral) insufficiency 02/22/2014    Past Surgical History:  Procedure Laterality Date  .  COLONOSCOPY  2011   Dr. Cindie Laroche Jule Ser)  . remove fatty tumor  2017  . THUMB ARTHROSCOPY Right 2014   Dr. Burney Gauze  . TONSILLECTOMY  1938    Allergies  Allergen Reactions  . Ace Inhibitors Cough    Cough  . Codeine Nausea And Vomiting  . Penicillins Hives and Rash  . Sulfa Antibiotics Hives and Rash    Allergies as of 03/24/2017      Reactions   Ace Inhibitors Cough   Cough   Codeine Nausea And Vomiting   Penicillins Hives, Rash   Sulfa Antibiotics Hives, Rash      Medication List        Accurate as of 03/24/17 11:59 PM. Always use your most recent med list.          apixaban 2.5 MG Tabs tablet Commonly known as:  ELIQUIS Take 1 tablet (2.5 mg total) by mouth 2 (two) times daily.   benzonatate 100 MG capsule Commonly known as:  TESSALON Take 1 capsule (100 mg total) by mouth 3 (three) times daily as needed for up to 5 days for cough.   BLACK CHERRY CONCENTRATE PO Take 2 tablets by mouth daily.   CENTRUM SILVER ADULT 50+ PO Take 1 tablet by mouth every morning.   diltiazem 120 MG 24 hr capsule Commonly known as:  CARDIZEM CD TAKE 1 CAPSULE DAILY.   furosemide 20 MG tablet Commonly known  as:  LASIX Take 20 mg by mouth daily.   loratadine 10 MG tablet Commonly known as:  CLARITIN Take 1 tablet (10 mg total) by mouth daily.   losartan 100 MG tablet Commonly known as:  COZAAR TAKE 1 TABLET DAILY TO CONTROL BLOOD PRESSURE.   Melatonin 5 MG Tabs Take 1 tablet by mouth at bedtime.   methylPREDNISolone 4 MG Tbpk tablet Commonly known as:  MEDROL DOSEPAK Day 1=6 tablets, Day 2=5 tablets, Day 3=4 tablets , Day 4=3 tablets, Day 5=2 tablets, Day 6=1 tablet   MYRBETRIQ 25 MG Tb24 tablet Generic drug:  mirabegron ER Take 25 mg by mouth daily.   Omega 3 1200 MG Caps Take 1 capsule by mouth 2 (two) times daily.       Review of Systems:  Review of Systems  Constitutional: Positive for activity change, appetite change and fatigue. Negative for  chills and fever.  HENT: Positive for sore throat. Negative for congestion, postnasal drip, rhinorrhea, sinus pressure, sinus pain, sneezing, trouble swallowing and voice change.   Respiratory: Positive for cough. Negative for chest tightness, shortness of breath and wheezing.   Cardiovascular: Negative for chest pain, palpitations and leg swelling.  Neurological: Negative for dizziness, tremors, weakness and headaches.  Psychiatric/Behavioral: Negative for agitation, behavioral problems and confusion.    Health Maintenance  Topic Date Due  . TETANUS/TDAP  08/07/2023  . INFLUENZA VACCINE  Completed  . DEXA SCAN  Completed  . PNA vac Low Risk Adult  Completed    Physical Exam: Vitals:   03/24/17 0934  BP: 140/70  Pulse: 89  Resp: 20  Temp: (!) 97.4 F (36.3 C)  TempSrc: Oral  SpO2: 94%  Weight: 143 lb 3.2 oz (65 kg)  Height: 5\' 2"  (1.575 m)   Body mass index is 26.19 kg/m. Physical Exam  Constitutional: She is oriented to person, place, and time. She appears well-developed and well-nourished. No distress.  HENT:  Head: Normocephalic and atraumatic.  Eyes: Conjunctivae and EOM are normal. Pupils are equal, round, and reactive to light.  Neck: Normal range of motion. Neck supple. No JVD present. No thyromegaly present.  Cardiovascular: Normal rate and regular rhythm.  No murmur heard. Pulmonary/Chest: Effort normal. She has wheezes. She has no rales.  Abdominal: Soft. Bowel sounds are normal.  Musculoskeletal: Normal range of motion. She exhibits no edema.  Neurological: She is alert and oriented to person, place, and time. She exhibits normal muscle tone. Coordination normal.  Skin: Skin is warm and dry. She is not diaphoretic.  Psychiatric: She has a normal mood and affect.    Labs reviewed: Basic Metabolic Panel: Recent Labs    01/04/17 0000 02/04/17 0655 03/11/17 1000  NA 140 140 142  K 4.0 4.1 3.8  CL 104 104 104  CO2 27 32 29  GLUCOSE 89 92 107*  BUN 19  22 15   CREATININE 0.86 0.76 0.73  CALCIUM 9.8 9.8 9.8  TSH 1.30  --   --    Liver Function Tests: Recent Labs    01/04/17 0000 02/04/17 0655 03/11/17 1000  AST 22 24 28   ALT 18 23 25   ALKPHOS  --   --  71  BILITOT 0.7 0.5 1.0  PROT 6.1 6.1 6.2*  ALBUMIN  --   --  3.5   No results for input(s): LIPASE, AMYLASE in the last 8760 hours. No results for input(s): AMMONIA in the last 8760 hours. CBC: Recent Labs    01/04/17 0000 02/04/17 0655 03/11/17  1000  WBC 5.4 5.0 4.5  HGB 14.1 13.2 13.6  HCT 41.5 37.6 41.4  MCV 94.7 91.3 98.6  PLT 234 246 204   Lipid Panel: Recent Labs    01/04/17 0000 02/04/17 0655  CHOL 161 150  HDL 69 72  TRIG 79 54  CHOLHDL 2.3 2.1   No results found for: HGBA1C  Procedures since last visit: Dg Chest 2 View  Result Date: 03/11/2017 CLINICAL DATA:  Chest pressure and productive cough. EXAM: CHEST  2 VIEW COMPARISON:  06/16/2013 FINDINGS: The cardiac silhouette is normal in size. Mild thoracic aortic tortuosity and atherosclerotic calcification are again seen. The lungs are clear. No pleural effusion or pneumothorax is identified. There is mild thoracic levoscoliosis. IMPRESSION: No active cardiopulmonary disease. Electronically Signed   By: Logan Bores M.D.   On: 03/11/2017 11:10    Assessment/Plan URI, acute hacking, non productive cough for 2-3 days, gradual onset, denied nasal congestion, running nose, facial pressure, chest pain, SOB, palpitation. She admitted scratchy throat, less appetite, and fatigue. She is afebrile, no O2 desaturation. Will Benzonatate 100mg  tid x 5 days, Claritin 10mg  daily x 5 days, Medrol dose pack 4mg , tabs 6-5-4-3-2-1. May consider CXR, CBC/diff, CMP if no better. Observe.     HTN (hypertension) Hx of Afib and HTN, blood pressure and heart rate are in control, continue Losartan 100mg  and Furosemide 20mg  daily.     PAF (paroxysmal atrial fibrillation) ([facility]) Heart rate is in control.     Labs/tests  ordered:   Next appt:  07/15/2017

## 2017-03-24 NOTE — Assessment & Plan Note (Signed)
Heart rate is in control.  

## 2017-03-25 ENCOUNTER — Ambulatory Visit: Payer: Medicare Other | Admitting: Nurse Practitioner

## 2017-04-01 ENCOUNTER — Encounter: Payer: Self-pay | Admitting: Nurse Practitioner

## 2017-04-01 ENCOUNTER — Non-Acute Institutional Stay: Payer: Medicare Other | Admitting: Nurse Practitioner

## 2017-04-01 DIAGNOSIS — I272 Pulmonary hypertension, unspecified: Secondary | ICD-10-CM | POA: Diagnosis not present

## 2017-04-01 DIAGNOSIS — I10 Essential (primary) hypertension: Secondary | ICD-10-CM | POA: Diagnosis not present

## 2017-04-01 DIAGNOSIS — J069 Acute upper respiratory infection, unspecified: Secondary | ICD-10-CM | POA: Diagnosis not present

## 2017-04-01 DIAGNOSIS — I480 Paroxysmal atrial fibrillation: Secondary | ICD-10-CM

## 2017-04-01 NOTE — Progress Notes (Signed)
Location:   Clinic [facility]   Place of Service:  Clinic (12) Provider: Marlana Latus NP  Code Status: DNR Goals of Care:  Advanced Directives 03/11/2017  Does Patient Have a Medical Advance Directive? No  Type of Advance Directive -  Does patient want to make changes to medical advance directive? -  Copy of [place] in Chart? -     Chief Complaint  Patient presents with  . Acute Visit    Patient is here to be evluated for oxygen. She stated that she has trouble breathing at night. [place] mentioned that she feels much better.     HPI: Patient is a 84 y.o. female seen today for an acute visit for complaining difficulty of breathing at night, has to sit up to catch air sometimes, she is afebrile, she stated her cough is better, no sputum production, denied chest pain/pressure, palpitation, she denied heartburn, indigestion, abd pain. She was treated for URI with medrol dose pack, Claritin, Benzonatate since 03/24/17. Hx of pulmonary hypertension, on Furosemide 20mg  daily, HTN on Losartan 100mg , Afib, heart rate is in control on Diltiazem 120mg  daily, Apixaban 2.5mg  bid.   Past Medical History:  Diagnosis Date  . Abnormality of gait 12/30/2015  . Arthritis   . Asthma   . Atrial fibrillation ([place])   . DD (diverticular disease) 10/26/2013  . 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 Diverticulitis   . Edema 12/30/2015  . Gout   . Gout 12/30/2015  . History of CVA (cerebrovascular accident)    Left basal ganglia   . Hyperlipemia   . Hypertension   . Liposarcoma ([place]) 12/20/2015   excised 12/20/15  . Mitral valve regurgitation   . Mitral valve regurgitation 12/30/2015  . OP (osteoporosis) 10/26/2013  . PAF (paroxysmal atrial fibrillation) ([place]) 07/28/2013  . Pseudogout of knee 07/28/2013   03/16/13 Uric acid 5.5   . Spondylosis of cervical joint 12/30/2015  . Spondylosis of cervical spine   . TI (tricuspid incompetence) 05/18/2011   Overview:  Moderate severe   .  Unspecified vitamin D deficiency 07/28/2013   03/16/13 Vit D 24.6   . Urinary incontinence without sensory awareness 07/28/2013  . Venous (peripheral) insufficiency 02/22/2014    Past Surgical History:  Procedure Laterality Date  . COLONOSCOPY  2011   Dr. Cindie Laroche Jule Ser)  . remove fatty tumor  2017  . THUMB ARTHROSCOPY Right 2014   Dr. Burney Gauze  . TONSILLECTOMY  1938    Allergies  Allergen Reactions  . Ace Inhibitors Cough    Cough  . Codeine Nausea And Vomiting  . Penicillins Hives and Rash  . Sulfa Antibiotics Hives and Rash    Allergies as of 04/01/2017      Reactions   Ace Inhibitors Cough   Cough   Codeine Nausea And Vomiting   Penicillins Hives, Rash   Sulfa Antibiotics Hives, Rash      Medication List        Accurate as of 04/01/17 11:59 PM. Always use your most recent med list.          apixaban 2.5 MG Tabs tablet Commonly known as:  ELIQUIS Take 1 tablet (2.5 mg total) by mouth 2 (two) times daily.   BLACK CHERRY CONCENTRATE PO Take 2 tablets by mouth daily.   CENTRUM SILVER ADULT 50+ PO Take 1 tablet by mouth every morning.   cetirizine 10 MG tablet Commonly known as:  ZYRTEC Take 10 mg by mouth daily.   diltiazem 120  MG 24 hr capsule Commonly known as:  CARDIZEM CD TAKE 1 CAPSULE DAILY.   furosemide 20 MG tablet Commonly known as:  LASIX Take 20 mg by mouth daily.   loratadine 10 MG tablet Commonly known as:  CLARITIN Take 1 tablet (10 mg total) by mouth daily.   losartan 100 MG tablet Commonly known as:  COZAAR TAKE 1 TABLET DAILY TO CONTROL BLOOD PRESSURE.   Melatonin 5 MG Tabs Take 1 tablet by mouth at bedtime.   MYRBETRIQ 25 MG Tb24 tablet Generic drug:  mirabegron ER Take 25 mg by mouth daily.   Omega 3 1200 MG Caps Take 1 capsule by mouth 2 (two) times daily.       Review of Systems:  Review of Systems  Constitutional: Positive for fatigue. Negative for activity change, appetite change, chills, diaphoresis and  fever.  HENT: Positive for hearing loss. Negative for congestion, trouble swallowing and voice change.   Eyes: Negative for visual disturbance.  Respiratory: Positive for cough and shortness of breath. Negative for choking, chest tightness and wheezing.        Paroxysmal nocturnal orthopnea.   Cardiovascular: Positive for leg swelling. Negative for chest pain.  Gastrointestinal: Negative for abdominal distention, abdominal pain, constipation, diarrhea, nausea and vomiting.  Endocrine: Negative for cold intolerance.  Genitourinary: Negative for dysuria and urgency.       Incontinent of urine.   Musculoskeletal: Positive for gait problem. Negative for joint swelling.  Skin: Negative for color change.  Neurological: Negative for tremors, speech difficulty, weakness and headaches.  Psychiatric/Behavioral: Negative for agitation, behavioral problems, confusion, hallucinations and sleep disturbance. The patient is not nervous/anxious.     Health Maintenance  Topic Date Due  . TETANUS/TDAP  08/07/2023  . INFLUENZA VACCINE  Completed  . DEXA SCAN  Completed  . PNA vac Low Risk Adult  Completed    Physical Exam: Vitals:   04/01/17 1438  BP: 132/72  Pulse: 76  Resp: 16  Temp: 97.7 F (36.5 C)  TempSrc: Oral  SpO2: 94%  Weight: 142 lb 9.6 oz (64.7 kg)  Height: 5\' 2"  (1.575 m)   Body mass index is 26.08 kg/m. Physical Exam  Constitutional: She is oriented to person, place, and time. She appears well-developed and well-nourished.  HENT:  Head: Normocephalic and atraumatic.  Eyes: EOM are normal. Pupils are equal, round, and reactive to light.  Neck: Normal range of motion. Neck supple. No JVD present. No thyromegaly present.  Cardiovascular: Normal rate and regular rhythm.  No murmur heard. Pulmonary/Chest: Effort normal and breath sounds normal. She has no wheezes. She has no rales.  Abdominal: Soft. Bowel sounds are normal. She exhibits no distension. There is no tenderness.    Musculoskeletal: She exhibits edema. She exhibits no tenderness.  Ambulates with walker. Trace edema in ankles.   Neurological: She is alert and oriented to person, place, and time. She exhibits normal muscle tone. Coordination normal.  Skin: Skin is warm and dry. No erythema.  Psychiatric: She has a normal mood and affect. Her behavior is normal. Judgment and thought content normal.    Labs reviewed: Basic Metabolic Panel: Recent Labs    01/04/17 0000 02/04/17 0655 03/11/17 1000 04/02/17  NA 140 140 142 140  K 4.0 4.1 3.8 4.5  CL 104 104 104  --   CO2 27 32 29  --   GLUCOSE 89 92 107*  --   BUN 19 22 15 20   CREATININE 0.86 0.76 0.73 0.7  CALCIUM  9.8 9.8 9.8  --   TSH 1.30  --   --   --    Liver Function Tests: Recent Labs    01/04/17 0000 02/04/17 0655 03/11/17 1000 04/02/17  AST 22 24 28 17   ALT 18 23 25 17   ALKPHOS  --   --  71 65  BILITOT 0.7 0.5 1.0  --   PROT 6.1 6.1 6.2*  --   ALBUMIN  --   --  3.5  --    No results for input(s): LIPASE, AMYLASE in the last 8760 hours. No results for input(s): AMMONIA in the last 8760 hours. CBC: Recent Labs    01/04/17 0000 02/04/17 0655 03/11/17 1000 04/02/17  WBC 5.4 5.0 4.5 5.3  HGB 14.1 13.2 13.6 13.3  HCT 41.5 37.6 41.4 38  MCV 94.7 91.3 98.6  --   PLT 234 246 204 217   Lipid Panel: Recent Labs    01/04/17 0000 02/04/17 0655  CHOL 161 150  HDL 69 72  TRIG 79 54  CHOLHDL 2.3 2.1   No results found for: HGBA1C  Procedures since last visit: Dg Chest 2 View  Result Date: 03/11/2017 CLINICAL DATA:  Chest pressure and productive cough. EXAM: CHEST  2 VIEW COMPARISON:  06/16/2013 FINDINGS: The cardiac silhouette is normal in size. Mild thoracic aortic tortuosity and atherosclerotic calcification are again seen. The lungs are clear. No pleural effusion or pneumothorax is identified. There is mild thoracic levoscoliosis. IMPRESSION: No active cardiopulmonary disease. Electronically Signed   By: Logan Bores M.D.    On: 03/11/2017 11:10    Assessment/Plan Pulmonary hypertension complaining difficulty of breathing at night, has to sit up to catch air sometimes, she is afebrile, she stated her cough is better, no sputum production, denied chest pain/pressure, palpitation, she denied heartburn, indigestion, abd pain. She was treated for URI with medrol dose pack, Claritin, Benzonatate since 03/24/17. Hx of pulmonary hypertension, on Furosemide 20mg  daily. Will have the patient stay in [place] for infirmary stay for observation since the patient has no one at home and unable to drive for workups. Will monitor  VS, Sat O2 q shift. Obtain CBC/diff, CMP, CXR ap view in am.   URI, acute Improved, but complaining paroxysmal nocturnal dyspnea, refer to hx of pulmonary hypertension.   HTN (hypertension) Blood pressure is controlled, continue Losartan 100mg  qd  PAF (paroxysmal atrial fibrillation) (HCC) Afib, heart rate is in control, continue Diltiazem 120mg  daily, Apixaban 2.5mg  bid.      Labs/tests ordered: CBC/diff, CMP, CXR ap view in am.   Plan of care reviewed with the patient  Next appt:  07/15/2017  Time spend 25 minutes.

## 2017-04-01 NOTE — Assessment & Plan Note (Signed)
Blood pressure is controlled, continue Losartan 100mg qd.  

## 2017-04-01 NOTE — Assessment & Plan Note (Addendum)
complaining difficulty of breathing at night, has to sit up to catch air sometimes, she is afebrile, she stated her cough is better, no sputum production, denied chest pain/pressure, palpitation, she denied heartburn, indigestion, abd pain. She was treated for URI with medrol dose pack, Claritin, Benzonatate since 03/24/17. Hx of pulmonary hypertension, on Furosemide 20mg  daily. Will have the patient stay in [place] for infirmary stay for observation since the patient has no one at home and unable to drive for workups. Will monitor  VS, Sat O2 q shift. Obtain CBC/diff, CMP, CXR ap view in am.

## 2017-04-01 NOTE — Assessment & Plan Note (Signed)
Improved, but complaining paroxysmal nocturnal dyspnea, refer to hx of pulmonary hypertension.

## 2017-04-01 NOTE — Assessment & Plan Note (Signed)
Afib, heart rate is in control, continue Diltiazem 120mg  daily, Apixaban 2.5mg  bid.

## 2017-04-02 LAB — HEPATIC FUNCTION PANEL
ALT: 17 (ref 7–35)
AST: 17 (ref 13–35)
Alkaline Phosphatase: 65 (ref 25–125)
Bilirubin, Total: 0.6

## 2017-04-02 LAB — BASIC METABOLIC PANEL
BUN: 20 (ref 4–21)
Creatinine: 0.7 (ref <=1.1)
Glucose: 90
POTASSIUM: 4.5 (ref 3.4–5.3)
Sodium: 140 (ref 137–147)

## 2017-04-02 LAB — CBC AND DIFFERENTIAL
HEMATOCRIT: 38 (ref 36–46)
HEMOGLOBIN: 13.3 (ref 12.0–16.0)
PLATELETS: 217 (ref 150–399)
WBC: 5.3

## 2017-04-05 ENCOUNTER — Other Ambulatory Visit: Payer: Self-pay | Admitting: *Deleted

## 2017-04-07 ENCOUNTER — Encounter: Payer: Medicare Other | Admitting: Internal Medicine

## 2017-04-07 ENCOUNTER — Non-Acute Institutional Stay: Payer: Medicare Other | Admitting: Nurse Practitioner

## 2017-04-07 ENCOUNTER — Encounter: Payer: Self-pay | Admitting: Nurse Practitioner

## 2017-04-07 DIAGNOSIS — R140 Abdominal distension (gaseous): Secondary | ICD-10-CM

## 2017-04-07 DIAGNOSIS — J069 Acute upper respiratory infection, unspecified: Secondary | ICD-10-CM

## 2017-04-07 DIAGNOSIS — I872 Venous insufficiency (chronic) (peripheral): Secondary | ICD-10-CM

## 2017-04-07 NOTE — Assessment & Plan Note (Signed)
She has no apparent edema in BLE, continue Furosemide 20mg  daily.

## 2017-04-07 NOTE — Assessment & Plan Note (Signed)
feeling bloated today, wants me to palpated her abdomen to see if there is a possible mass. She has has regular bowel movement and urination. She denied nausea, vomiting, heartburn, indigestion, abdominal pain, or blood in urine or BM. She admitted her appetite was not good today. She is afebrile, denied dysuria. Tympany in umbilical area on percussion. The patient is instructed to keep physical active, eating cereal or soup, observe BM, urine, and worsening or new symptoms.

## 2017-04-07 NOTE — Assessment & Plan Note (Addendum)
She has been treated for URI recently with Prednisone taper dose, Benzonatate, Claritin.  resolved to her baseline occasional hacking cough, she denied chest pain, chest pressure, palpitation, or SOB.

## 2017-04-07 NOTE — Progress Notes (Signed)
Location: [facility]   Place of Service:  Clinic (12) Provider: Marlana Latus NP  Code Status: DNR Goals of Care: [place] Advanced Directives 03/11/2017  Does Patient Have a Medical Advance Directive? No  Type of Advance Directive -  Does patient want to make changes to medical advance directive? -  Copy of [place] in Chart? -     Chief Complaint  Patient presents with  . Acute Visit    stomach feels full, BM- last night (normal)    HPI: Patient is a 84 y.o. female seen today for an acute visit for feeling bloated today, wants me to palpated her abdomen to see if there is a possible mass. She has has regular bowel movement and urination. She denied nausea, vomiting, heartburn, indigestion, abdominal pain, or blood in urine or BM. She admitted her appetite was not good today. She is afebrile, denied dysuria. She has been treated for URI recently, resolved to her baseline occasional hacking cough, she denied chest pain, chest pressure, palpitation, or SOB. She has no apparent edema in BLE while on Furosemide 20mg  daily.   Past Medical History:  Diagnosis Date  . Abnormality of gait 12/30/2015  . Arthritis   . Asthma   . Atrial fibrillation ([place])   . DD (diverticular disease) 10/26/2013  . 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 Diverticulitis   . Edema 12/30/2015  . Gout   . Gout 12/30/2015  . History of CVA (cerebrovascular accident)    Left basal ganglia   . Hyperlipemia   . Hypertension   . Liposarcoma ([place]) 12/20/2015   excised 12/20/15  . Mitral valve regurgitation   . Mitral valve regurgitation 12/30/2015  . OP (osteoporosis) 10/26/2013  . PAF (paroxysmal atrial fibrillation) ([place]) 07/28/2013  . Pseudogout of knee 07/28/2013   03/16/13 Uric acid 5.5   . Spondylosis of cervical joint 12/30/2015  . Spondylosis of cervical spine   . TI (tricuspid incompetence) 05/18/2011   Overview:  Moderate severe   . Unspecified vitamin D deficiency 07/28/2013   03/16/13  Vit D 24.6   . Urinary incontinence without sensory awareness 07/28/2013  . Venous (peripheral) insufficiency 02/22/2014    Past Surgical History:  Procedure Laterality Date  . COLONOSCOPY  2011   Dr. Cindie Laroche Jule Ser)  . remove fatty tumor  2017  . THUMB ARTHROSCOPY Right 2014   Dr. Burney Gauze  . TONSILLECTOMY  1938    Allergies  Allergen Reactions  . Ace Inhibitors Cough    Cough  . Codeine Nausea And Vomiting  . Penicillins Hives and Rash  . Sulfa Antibiotics Hives and Rash    Allergies as of 04/07/2017      Reactions   Ace Inhibitors Cough   Cough   Codeine Nausea And Vomiting   Penicillins Hives, Rash   Sulfa Antibiotics Hives, Rash      Medication List        Accurate as of 04/07/17 11:59 PM. Always use your most recent med list.          apixaban 2.5 MG Tabs tablet Commonly known as:  ELIQUIS Take 1 tablet (2.5 mg total) by mouth 2 (two) times daily.   BLACK CHERRY CONCENTRATE PO Take 2 tablets by mouth daily.   CENTRUM SILVER ADULT 50+ PO Take 1 tablet by mouth every morning.   cetirizine 10 MG tablet Commonly known as:  ZYRTEC Take 10 mg by mouth daily.   diltiazem 120 MG 24 hr capsule Commonly known as:  CARDIZEM CD TAKE 1 CAPSULE DAILY.   furosemide 20 MG tablet Commonly known as:  LASIX Take 20 mg by mouth daily.   losartan 100 MG tablet Commonly known as:  COZAAR TAKE 1 TABLET DAILY TO CONTROL BLOOD PRESSURE.   Melatonin 5 MG Tabs Take 1 tablet by mouth at bedtime.   MYRBETRIQ 25 MG Tb24 tablet Generic drug:  mirabegron ER Take 25 mg by mouth daily.   Omega 3 1200 MG Caps Take 1 capsule by mouth 2 (two) times daily.       Review of Systems:  Review of Systems  Constitutional: Negative for activity change, appetite change, chills, diaphoresis, fatigue and fever.  HENT: Positive for hearing loss. Negative for congestion, trouble swallowing and voice change.   Eyes: Negative for visual disturbance.  Respiratory:  Positive for cough. Negative for choking, chest tightness, shortness of breath and wheezing.   Cardiovascular: Negative for chest pain, palpitations and leg swelling.  Gastrointestinal: Negative for abdominal distention, abdominal pain, blood in stool, constipation, diarrhea, nausea and vomiting.       Complaining bloated.   Genitourinary: Negative for dysuria and urgency.  Musculoskeletal: Negative for gait problem.  Skin: Negative for color change and pallor.  Neurological: Negative for dizziness, tremors, speech difficulty and weakness.  Psychiatric/Behavioral: Negative for agitation, behavioral problems, confusion, hallucinations and sleep disturbance. The patient is not nervous/anxious.     Health Maintenance  Topic Date Due  . TETANUS/TDAP  08/07/2023  . INFLUENZA VACCINE  Completed  . DEXA SCAN  Completed  . PNA vac Low Risk Adult  Completed    Physical Exam: Vitals:   04/07/17 1009  BP: 122/74  Pulse: 84  Resp: 20  Temp: 98.4 F (36.9 C)  SpO2: 92%  Weight: 145 lb 12.8 oz (66.1 kg)  Height: 5\' 2"  (1.575 m)   Body mass index is 26.67 kg/m. Physical Exam  Constitutional: She is oriented to person, place, and time. She appears well-developed and well-nourished. No distress.  HENT:  Head: Normocephalic and atraumatic.  Eyes: Conjunctivae and EOM are normal. Pupils are equal, round, and reactive to light.  Neck: Normal range of motion. Neck supple. No JVD present.  Cardiovascular: Normal rate, regular rhythm and normal heart sounds.  No murmur heard. Pulmonary/Chest: Effort normal and breath sounds normal. She has no wheezes. She has no rales.  Abdominal: Soft. Bowel sounds are normal. She exhibits no distension and no mass. There is no tenderness. There is no rebound and no guarding.  Tympany sounds in umbilical area upon percussion  Musculoskeletal: Normal range of motion. She exhibits no edema or tenderness.  Neurological: She is alert and oriented to person, place,  and time. She exhibits normal muscle tone. Coordination normal.  Skin: Skin is warm and dry. She is not diaphoretic.  Psychiatric: She has a normal mood and affect. Her behavior is normal.    Labs reviewed: Basic Metabolic Panel: Recent Labs    01/04/17 0000 02/04/17 0655 03/11/17 1000 04/02/17  NA 140 140 142 140  K 4.0 4.1 3.8 4.5  CL 104 104 104  --   CO2 27 32 29  --   GLUCOSE 89 92 107*  --   BUN 19 22 15 20   CREATININE 0.86 0.76 0.73 0.7  CALCIUM 9.8 9.8 9.8  --   TSH 1.30  --   --   --    Liver Function Tests: Recent Labs    01/04/17 0000 02/04/17 0655 03/11/17 1000 04/02/17  AST 22  24 28 17   ALT 18 23 25 17   ALKPHOS  --   --  71 65  BILITOT 0.7 0.5 1.0  --   PROT 6.1 6.1 6.2*  --   ALBUMIN  --   --  3.5  --    No results for input(s): LIPASE, AMYLASE in the last 8760 hours. No results for input(s): AMMONIA in the last 8760 hours. CBC: Recent Labs    01/04/17 0000 02/04/17 0655 03/11/17 1000 04/02/17  WBC 5.4 5.0 4.5 5.3  HGB 14.1 13.2 13.6 13.3  HCT 41.5 37.6 41.4 38  MCV 94.7 91.3 98.6  --   PLT 234 246 204 217   Lipid Panel: Recent Labs    01/04/17 0000 02/04/17 0655  CHOL 161 150  HDL 69 72  TRIG 79 54  CHOLHDL 2.3 2.1   No results found for: HGBA1C  Procedures since last visit: Dg Chest 2 View  Result Date: 03/11/2017 CLINICAL DATA:  Chest pressure and productive cough. EXAM: CHEST  2 VIEW COMPARISON:  06/16/2013 FINDINGS: The cardiac silhouette is normal in size. Mild thoracic aortic tortuosity and atherosclerotic calcification are again seen. The lungs are clear. No pleural effusion or pneumothorax is identified. There is mild thoracic levoscoliosis. IMPRESSION: No active cardiopulmonary disease. Electronically Signed   By: Logan Bores M.D.   On: 03/11/2017 11:10    Assessment/Plan Bloated abdomen feeling bloated today, wants me to palpated her abdomen to see if there is a possible mass. She has has regular bowel movement and  urination. She denied nausea, vomiting, heartburn, indigestion, abdominal pain, or blood in urine or BM. She admitted her appetite was not good today. She is afebrile, denied dysuria. Tympany in umbilical area on percussion. The patient is instructed to keep physical active, eating cereal or soup, observe BM, urine, and worsening or new symptoms.  URI, acute  She has been treated for URI recently with Prednisone taper dose, Benzonatate, Claritin.  resolved to her baseline occasional hacking cough, she denied chest pain, chest pressure, palpitation, or SOB.    Venous (peripheral) insufficiency She has no apparent edema in BLE, continue Furosemide 20mg  daily.      Labs/tests ordered: none  Next appt:  07/15/2017  Time spend 25 minutes.

## 2017-04-20 ENCOUNTER — Encounter: Payer: Self-pay | Admitting: Nurse Practitioner

## 2017-04-20 NOTE — Progress Notes (Signed)
This encounter was created in error - please disregard.

## 2017-04-21 ENCOUNTER — Encounter: Payer: Self-pay | Admitting: Nurse Practitioner

## 2017-04-21 ENCOUNTER — Non-Acute Institutional Stay: Payer: Medicare Other | Admitting: Nurse Practitioner

## 2017-04-21 DIAGNOSIS — I270 Primary pulmonary hypertension: Secondary | ICD-10-CM

## 2017-04-21 DIAGNOSIS — I272 Pulmonary hypertension, unspecified: Secondary | ICD-10-CM

## 2017-04-21 DIAGNOSIS — C480 Malignant neoplasm of retroperitoneum: Secondary | ICD-10-CM

## 2017-04-21 DIAGNOSIS — I5032 Chronic diastolic (congestive) heart failure: Secondary | ICD-10-CM | POA: Diagnosis not present

## 2017-04-21 DIAGNOSIS — G4736 Sleep related hypoventilation in conditions classified elsewhere: Secondary | ICD-10-CM

## 2017-04-21 DIAGNOSIS — I480 Paroxysmal atrial fibrillation: Secondary | ICD-10-CM

## 2017-04-21 DIAGNOSIS — I10 Essential (primary) hypertension: Secondary | ICD-10-CM

## 2017-04-21 DIAGNOSIS — F418 Other specified anxiety disorders: Secondary | ICD-10-CM

## 2017-04-21 NOTE — Assessment & Plan Note (Signed)
Blood pressure is controlled, continue Losartan 100mg  daily.

## 2017-04-21 NOTE — Assessment & Plan Note (Addendum)
Heart rate is in control, continue Diltiazem 120mg  daily. Continue Eliquis 2.5mg  bid for thromboembolic risk reduction.

## 2017-04-21 NOTE — Progress Notes (Deleted)
Location:  [facility] Room Number: 956 Place of Service:  ALF [phone]) Provider:  Mast, Manxie   NP  Mast, Man X, NP  Patient Care Team: Mast, Man X, NP as PCP - General (Internal Medicine) Mast, Man X, NP as Nurse Practitioner (Nurse Practitioner) Bo Merino, MD as Consulting Physician (Rheumatology) Curly Rim, MD as Consulting Physician (Internal Medicine) Ernst Spell, MD as Referring Physician (Urology) Charlotte Crumb, MD as Consulting Physician (Orthopedic Surgery) Festus Aloe, MD as Consulting Physician (Urology)  Extended Emergency Contact Information Primary Emergency Contact: [facility] Address: [address]          [place], [place] [address] [name] of [place] Phone: [phone] Mobile Phone: [phone] Relation: Son Secondary Emergency Contact: Alessandra Grout [place] of [place] Phone: [phone] Relation: Other  Code Status:  Full Code Goals of care: Advanced Directive information Advanced Directives 04/21/2017  Does Patient Have a Medical Advance Directive? No  Type of Advance Directive -  Does patient want to make changes to medical advance directive? -  Copy of [place] in Chart? -  Would patient like information on creating a medical advance directive? No - Patient declined     Chief Complaint  Patient presents with  . Acute Visit    SOB, oxygen asessment    HPI:  Pt is a 84 y.o. female seen today for an acute visit for    Past Medical History:  Diagnosis Date  . Abnormality of gait 12/30/2015  . Arthritis   . Asthma   . Atrial fibrillation ([place])   . DD (diverticular disease) 10/26/2013  . 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 Diverticulitis   . Edema 12/30/2015  . Gout   . Gout 12/30/2015  . History of CVA (cerebrovascular accident)    Left basal ganglia   . Hyperlipemia   . Hypertension   . 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 Mitral valve regurgitation   . Mitral valve regurgitation 12/30/2015  . OP (osteoporosis) 10/26/2013  . PAF (paroxysmal atrial fibrillation) ([place]) 07/28/2013  . Pseudogout of knee 07/28/2013   03/16/13 Uric acid 5.5   . Spondylosis of cervical joint 12/30/2015  . Spondylosis of cervical spine   . TI (tricuspid incompetence) 05/18/2011   Overview:  Moderate severe   . Unspecified vitamin D deficiency 07/28/2013   03/16/13 Vit D 24.6   . Urinary incontinence without sensory awareness 07/28/2013  . Venous (peripheral) insufficiency 02/22/2014   Past Surgical History:  Procedure Laterality Date  . COLONOSCOPY  2011   Dr. Cindie Laroche Jule Ser)  . remove fatty tumor  2017  . THUMB ARTHROSCOPY Right 2014   Dr. Burney Gauze  . TONSILLECTOMY  1938    Allergies  Allergen Reactions  . Ace Inhibitors Cough    Cough  . Codeine Nausea And Vomiting  . Penicillins Hives and Rash  . Sulfa Antibiotics Hives and Rash    Outpatient Encounter Medications as of 04/21/2017  Medication Sig  . apixaban (ELIQUIS) 2.5 MG TABS tablet Take 1 tablet (2.5 mg total) by mouth 2 (two) times daily.  Marland Kitchen diltiazem (CARDIZEM CD) 120 MG 24 hr capsule TAKE 1 CAPSULE DAILY.  . furosemide (LASIX) 20 MG tablet Take 20 mg by mouth daily.  Marland Kitchen losartan (COZAAR) 100 MG tablet TAKE 1 TABLET DAILY TO CONTROL BLOOD PRESSURE.  . Melatonin 5 MG TABS Take 1 tablet by mouth at bedtime.  . mirabegron ER (MYRBETRIQ) 25 MG TB24  tablet Take 25 mg by mouth daily.  . Misc Natural Products (BLACK CHERRY CONCENTRATE PO) Take 2 tablets by mouth daily.  . Multiple Vitamins-Minerals (CENTRUM SILVER ADULT 50+ PO) Take 1 tablet by mouth every morning.   . Omega 3 1200 MG CAPS Take 1 capsule by mouth 2 (two) times daily.   . [DISCONTINUED] cetirizine (ZYRTEC) 10 MG tablet Take 10 mg by mouth daily.   No facility-administered encounter medications on file as of 04/21/2017.     Review of Systems  Immunization History  Administered  Date(s) Administered  . Influenza Split 11/07/2009  . Influenza, High Dose Seasonal PF 11/18/2016  . Influenza,inj,quad, With Preservative 01/10/2016  . Influenza-Unspecified 11/09/2012, 11/27/2013, 11/08/2014, 11/21/2015  . Pneumococcal Conjugate-13 01/10/2016  . Pneumococcal Polysaccharide-23 02/09/2002  . Td 08/10/2003  . Tdap 08/06/2013  . Zoster 03/12/2005   Pertinent  Health Maintenance Due  Topic Date Due  . INFLUENZA VACCINE  Completed  . DEXA SCAN  Completed  . PNA vac Low Risk Adult  Completed   Fall Risk  01/07/2017 11/18/2015 06/06/2015 06/06/2015 05/10/2014  Falls in the past year? No No No No No  Number falls in past yr: - - - - -   Functional Status Survey:    Vitals:   04/21/17 1411  BP: 140/80  Pulse: 90  Resp: (!) 22  Temp: 98 F (36.7 C)  SpO2: (!) 82%   There is no height or weight on file to calculate BMI. Physical Exam  Labs reviewed: Recent Labs    01/04/17 0000 02/04/17 0655 03/11/17 1000 04/02/17  NA 140 140 142 140  K 4.0 4.1 3.8 4.5  CL 104 104 104  --   CO2 27 32 29  --   GLUCOSE 89 92 107*  --   BUN 19 22 15 20   CREATININE 0.86 0.76 0.Diana 0.7  CALCIUM 9.8 9.8 9.8  --    Recent Labs    01/04/17 0000 02/04/17 0655 03/11/17 1000 04/02/17  AST 22 24 28 17   ALT 18 23 25 17   ALKPHOS  --   --  71 65  BILITOT 0.7 0.5 1.0  --   PROT 6.1 6.1 6.2*  --   ALBUMIN  --   --  3.5  --    Recent Labs    01/04/17 0000 02/04/17 0655 03/11/17 1000 04/02/17  WBC 5.4 5.0 4.5 5.3  HGB 14.1 13.2 13.6 13.3  HCT 41.5 37.6 41.4 38  MCV 94.7 91.3 98.6  --   PLT 234 246 204 217   Lab Results  Component Value Date   TSH 1.30 01/04/2017   No results found for: HGBA1C Lab Results  Component Value Date   CHOL 150 02/04/2017   HDL 72 02/04/2017   LDLCALC 65 02/04/2017   TRIG 54 02/04/2017   CHOLHDL 2.1 02/04/2017    Significant Diagnostic Results in last 30 days:  No results found.  Assessment/Plan There are no diagnoses linked to this  encounter.   Family/ staff Communication:   Labs/tests ordered:

## 2017-04-21 NOTE — Assessment & Plan Note (Signed)
01/2017 echocardiogram Left ventricle: The cavity size was normal. Systolic function was normal. The estimated ejection fraction was in the range of 60%-to 65%. Doppler parameters are consistent with abnormal left ventricular relaxation (grade 1 diastolic dysfunction) The patient has O2 desaturation at night recently, will apply O2 at 2lpm via [place] to maintain O2 sat >89%, Sat o2 q shift. Will update echocardiogram since her O2 desaturation is noted, obtain BNP, update CBC CMP. Continue Furosemide 20mg  daily.

## 2017-04-21 NOTE — Progress Notes (Signed)
Location:  [facility] Room Number: 527 Place of Service:  ALF [phone]) Provider:  Kameelah Minish, Manxie   NP  Tanja Gift X, NP  Patient Care Team: Rudi Bunyard X, NP as PCP - General (Internal Medicine) Turkessa Ostrom X, NP as Nurse Practitioner (Nurse Practitioner) Bo Merino, MD as Consulting Physician (Rheumatology) Curly Rim, MD as Consulting Physician (Internal Medicine) Ernst Spell, MD as Referring Physician (Urology) Charlotte Crumb, MD as Consulting Physician (Orthopedic Surgery) Festus Aloe, MD as Consulting Physician (Urology)  Extended Emergency Contact Information Primary Emergency Contact: [facility] Address: [address]          [place], [place] [address] [name] of [place] Phone: [phone] Mobile Phone: [phone] Relation: Son Secondary Emergency Contact: Alessandra Grout [place] of [place] Phone: [phone] Relation: Other  Code Status:  Full Code Goals of care: Advanced Directive information Advanced Directives 04/21/2017  Does Patient Have a Medical Advance Directive? No  Type of Advance Directive -  Does patient want to make changes to medical advance directive? -  Copy of [place] in Chart? -  Would patient like information on creating a medical advance directive? No - Patient declined     Chief Complaint  Patient presents with  . Acute Visit    SOB, oxygen asessment    HPI:  Pt is a 84 y.o. female seen today for an acute visit for O2 desaturation, mostly at night, sometimes on exertion, gradual onset, more noted in the past week. She denied chest pain, palpitation, cough, or sputum production. Hx of diastolic dysfunction, pulmonary hypertension, on Furosemide 20mg  daily, no apparent edema in BLE. Afib, heart rate is in control, on Diltiazem 120mg  daily, Eliquis 2.5mg  bid for thromboembolic risk reduction. Her blood pressure is controlled on Losartan 100mg  daily.     Past Medical History:  Diagnosis Date  . Abnormality of gait 12/30/2015  . Arthritis   . Asthma   . Atrial fibrillation ([place])   . DD (diverticular disease) 10/26/2013  . 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 Diverticulitis   . Edema 12/30/2015  . Gout   . Gout 12/30/2015  . History of CVA (cerebrovascular accident)    Left basal ganglia   . Hyperlipemia   . Hypertension   . 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 Mitral valve regurgitation   . Mitral valve regurgitation 12/30/2015  . OP (osteoporosis) 10/26/2013  . PAF (paroxysmal atrial fibrillation) ([place]) 07/28/2013  . Pseudogout of knee 07/28/2013   03/16/13 Uric acid 5.5   . Spondylosis of cervical joint 12/30/2015  . Spondylosis of cervical spine   . TI (tricuspid incompetence) 05/18/2011   Overview:  Moderate severe   . Unspecified vitamin D deficiency 07/28/2013   03/16/13 Vit D 24.6   . Urinary incontinence without sensory awareness 07/28/2013  . Venous (peripheral) insufficiency 02/22/2014   Past Surgical History:  Procedure Laterality Date  . COLONOSCOPY  2011   Dr. Cindie Laroche Jule Ser)  . remove fatty tumor  2017  . THUMB ARTHROSCOPY Right 2014   Dr. Burney Gauze  . TONSILLECTOMY  1938    Allergies  Allergen Reactions  . Ace Inhibitors Cough    Cough  . Codeine Nausea And Vomiting  . Penicillins Hives and Rash  . Sulfa Antibiotics Hives and Rash    Outpatient Encounter Medications as of 04/21/2017  Medication Sig  . apixaban (ELIQUIS) 2.5 MG TABS tablet Take 1 tablet (2.5 mg total) by mouth 2 (  two) times daily.  Marland Kitchen diltiazem (CARDIZEM CD) 120 MG 24 hr capsule TAKE 1 CAPSULE DAILY.  . furosemide (LASIX) 20 MG tablet Take 20 mg by mouth daily.  Marland Kitchen losartan (COZAAR) 100 MG tablet TAKE 1 TABLET DAILY TO CONTROL BLOOD PRESSURE.  . Melatonin 5 MG TABS Take 1 tablet by mouth at bedtime.  . mirabegron ER (MYRBETRIQ) 25 MG TB24 tablet Take 25 mg by mouth daily.  . Misc Natural Products (BLACK CHERRY  CONCENTRATE PO) Take 2 tablets by mouth daily.  . Multiple Vitamins-Minerals (CENTRUM SILVER ADULT 50+ PO) Take 1 tablet by mouth every morning.   . Omega 3 1200 MG CAPS Take 1 capsule by mouth 2 (two) times daily.   . [DISCONTINUED] cetirizine (ZYRTEC) 10 MG tablet Take 10 mg by mouth daily.   No facility-administered encounter medications on file as of 04/21/2017.     Review of Systems  Immunization History  Administered Date(s) Administered  . Influenza Split 11/07/2009  . Influenza, High Dose Seasonal PF 11/18/2016  . Influenza,inj,quad, With Preservative 01/10/2016  . Influenza-Unspecified 11/09/2012, 11/27/2013, 11/08/2014, 11/21/2015  . Pneumococcal Conjugate-13 01/10/2016  . Pneumococcal Polysaccharide-23 02/09/2002  . Td 08/10/2003  . Tdap 08/06/2013  . Zoster 03/12/2005   Pertinent  Health Maintenance Due  Topic Date Due  . INFLUENZA VACCINE  Completed  . DEXA SCAN  Completed  . PNA vac Low Risk Adult  Completed   Fall Risk  01/07/2017 11/18/2015 06/06/2015 06/06/2015 05/10/2014  Falls in the past year? No No No No No  Number falls in past yr: - - - - -   Functional Status Survey:    Vitals:   04/21/17 1411  BP: 140/80  Pulse: 90  Resp: (!) 22  Temp: 98 F (36.7 C)  SpO2: (!) 82%   There is no height or weight on file to calculate BMI. Physical Exam  Labs reviewed: Recent Labs    01/04/17 0000 02/04/17 0655 03/11/17 1000 04/02/17  NA 140 140 142 140  K 4.0 4.1 3.8 4.5  CL 104 104 104  --   CO2 27 32 29  --   GLUCOSE 89 92 107*  --   BUN 19 22 15 20   CREATININE 0.86 0.76 0.73 0.7  CALCIUM 9.8 9.8 9.8  --    Recent Labs    01/04/17 0000 02/04/17 0655 03/11/17 1000 04/02/17  AST 22 24 28 17   ALT 18 23 25 17   ALKPHOS  --   --  71 65  BILITOT 0.7 0.5 1.0  --   PROT 6.1 6.1 6.2*  --   ALBUMIN  --   --  3.5  --    Recent Labs    01/04/17 0000 02/04/17 0655 03/11/17 1000 04/02/17  WBC 5.4 5.0 4.5 5.3  HGB 14.1 13.2 13.6 13.3  HCT 41.5 37.6  41.4 38  MCV 94.7 91.3 98.6  --   PLT 234 246 204 217   Lab Results  Component Value Date   TSH 1.30 01/04/2017   No results found for: HGBA1C Lab Results  Component Value Date   CHOL 150 02/04/2017   HDL 72 02/04/2017   LDLCALC 65 02/04/2017   TRIG 54 02/04/2017   CHOLHDL 2.1 02/04/2017    Significant Diagnostic Results in last 30 days:  No results found.  Assessment/Plan CHF (congestive heart failure), NYHA class I, chronic, diastolic ([place]) 88/4166 echocardiogram Left ventricle: The cavity size was normal. Systolic function was normal. The estimated ejection fraction was in  the range of 60%-to 65%. Doppler parameters are consistent with abnormal left ventricular relaxation (grade 1 diastolic dysfunction) The patient has O2 desaturation at night recently, will apply O2 at 2lpm via [place] to maintain O2 sat >89%, Sat o2 q shift. Will update echocardiogram since her O2 desaturation is noted, obtain BNP, update CBC CMP. Continue Furosemide 20mg  daily.      Family/ staff Communication: plan of care reviewed with the patient and charge nurse.   Labs/tests ordered:  Echocardiogram, CBC, CMP, BNP  Time spend 25 minutes.  This encounter was created in error - please disregard.

## 2017-04-22 LAB — CBC AND DIFFERENTIAL
HEMATOCRIT: 43 (ref 36–46)
HEMOGLOBIN: 14.8 (ref 12.0–16.0)
Platelets: 326 (ref 150–399)
WBC: 5.8

## 2017-04-22 LAB — BASIC METABOLIC PANEL
BUN: 8 (ref 4–21)
Creatinine: 0.6 (ref <=1.1)
Glucose: 102
POTASSIUM: 3.9 (ref 3.4–5.3)
SODIUM: 141 (ref 137–147)

## 2017-04-22 LAB — HEPATIC FUNCTION PANEL
ALK PHOS: 77 (ref 25–125)
ALT: 16 (ref 7–35)
AST: 18 (ref 13–35)
BILIRUBIN, TOTAL: 0.6

## 2017-04-22 NOTE — Assessment & Plan Note (Signed)
SOB at night or on exertion for about week, graduate onset, O2 2lpm via [place] helped. She denied chest pain, cough,  palpitation, she is afebrile. She has history of pulmonary hypertension, echocardiogram 01/29/17  The estimated ejection fraction was in the range of 60% to 65%. Doppler parameters are consistent with abnormal left ventricular relaxation (grade 1 diastolic dysfunction. Continue Furosemide 20mg  daily, update CBC CMP echocardiogram BNP

## 2017-04-22 NOTE — Assessment & Plan Note (Signed)
Afib, heart rate is in control, continue Diltiazem 120mg  daily, Eliquis 2.5mg  bid for thromboembolic risk reduction.

## 2017-04-22 NOTE — Assessment & Plan Note (Signed)
Hx of pulmonary hypertension, SOB and O2 desaturation occurred, continue Furosemide 20mg  daily, trace edema seen in BLE.

## 2017-04-22 NOTE — Assessment & Plan Note (Signed)
Her blood pressure is in control, continue Losartan 100mg  qd.

## 2017-04-22 NOTE — Assessment & Plan Note (Addendum)
O2 desaturation at night and exertion, continue Furosemide 20mg  daily, apply O2 @ 2lpm via Diana Petersen to maintain Sat o2>89% 04/23/17 2pm: conference: the patient's son Diana Petersen, Diana Petersen administer, Diana Ruddle RN, Diana Rear RN, social worker Diana Petersen, myself: plan of care, Cardiology, AL setting recommended, may be anxiety is contributory to her cognition change. 04/23/17 wbc 5.8, Hgb 14.8, plt 326, Na 141, K3.9, Bun 8, creat 0.60, BNP36, MMSE 26/30.   Extensive discussion with the patient's POA son, Scientist, physiological, Education officer, museum, Corporate investment banker, and myself: admitting the patient to [facility] for higher level of care due to her lack of sufficient self care at independent living setting.

## 2017-04-22 NOTE — Progress Notes (Addendum)
Location:  [place] Room Number: 088 Place of Service:  ALF [id number]) Provider:    Kimbely Whiteaker X, NP  Patient Care Team: Madyson Lukach X, NP as PCP - General (Internal Medicine) Shakesha Soltau X, NP as Nurse Practitioner (Nurse Practitioner) Bo Merino, MD as Consulting Physician (Rheumatology) Curly Rim, MD as Consulting Physician (Internal Medicine) Ernst Spell, MD as Referring Physician (Urology) Charlotte Crumb, MD as Consulting Physician (Orthopedic Surgery) Festus Aloe, MD as Consulting Physician (Urology)  Extended Emergency Contact Information Primary Emergency Contact: [facility] Address: [address]          [place], [place] [address] [name] of [place] Phone: [phone] Mobile Phone: [phone] Relation: Son Secondary Emergency Contact: Alessandra Grout [place] of [place] Phone: [phone] Relation: Other  Code Status:  Full code Goals of care: Advanced Directive information Advanced Directives 04/21/2017  Does Patient Have a Medical Advance Directive? No  Type of Advance Directive -  Does patient want to make changes to medical advance directive? -  Copy of [place] in Chart? -  Would patient like information on creating a medical advance directive? No - Patient declined     Chief Complaint  Patient presents with  . Acute Visit    SOB    HPI:  Pt is a 84 y.o. female seen today for an acute visit for SOB at night or on exertion for about week, graduate onset, O2 2lpm via [place] helped. She denied chest pain, cough,  palpitation, she is afebrile. She has history of pulmonary hypertension, echocardiogram 01/29/17  The estimated ejection fraction was in the range of 60% to 65%. Doppler parameters are consistent with abnormal left ventricular relaxation (grade 1 diastolic dysfunction. Hx of pulmonary hypertension, on Furosemide 20mg  daily, trace edema seen in BLE. Afib, heart  rate is in control on Diltiazem 120mg  daily. Taking Eliquis 2.5mg  bid for thromboembolic risk reduction.  Her blood pressure is in control, taking Losartan 100mg  qd.  Past Medical History:  Diagnosis Date  . Abnormality of gait 12/30/2015  . Arthritis   . Asthma   . Atrial fibrillation ([place])   . DD (diverticular disease) 10/26/2013  . 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 Diverticulitis   . Edema 12/30/2015  . Gout   . Gout 12/30/2015  . History of CVA (cerebrovascular accident)    Left basal ganglia   . Hyperlipemia   . Hypertension   . Liposarcoma ([place]) 12/20/2015   excised 12/20/15  . Mitral valve regurgitation   . Mitral valve regurgitation 12/30/2015  . OP (osteoporosis) 10/26/2013  . PAF (paroxysmal atrial fibrillation) ([place]) 07/28/2013  . Pseudogout of knee 07/28/2013   03/16/13 Uric acid 5.5   . Spondylosis of cervical joint 12/30/2015  . Spondylosis of cervical spine   . TI (tricuspid incompetence) 05/18/2011   Overview:  Moderate severe   . Unspecified vitamin D deficiency 07/28/2013   03/16/13 Vit D 24.6   . Urinary incontinence without sensory awareness 07/28/2013  . Venous (peripheral) insufficiency 02/22/2014   Past Surgical History:  Procedure Laterality Date  . COLONOSCOPY  2011   Dr. Cindie Laroche Jule Ser)  . remove fatty tumor  2017  . THUMB ARTHROSCOPY Right 2014   Dr. Burney Gauze  . TONSILLECTOMY  1938    Allergies  Allergen Reactions  . Ace Inhibitors Cough    Cough  . Codeine Nausea And Vomiting  . Penicillins Hives and Rash  . Sulfa Antibiotics Hives and Rash  Outpatient Encounter Medications as of 04/21/2017  Medication Sig  . apixaban (ELIQUIS) 2.5 MG TABS tablet Take 1 tablet (2.5 mg total) by mouth 2 (two) times daily.  Marland Kitchen diltiazem (CARDIZEM CD) 120 MG 24 hr capsule TAKE 1 CAPSULE DAILY.  . furosemide (LASIX) 20 MG tablet Take 20 mg by mouth daily.  Marland Kitchen losartan (COZAAR) 100 MG tablet TAKE 1 TABLET DAILY TO CONTROL BLOOD PRESSURE.  .  Melatonin 5 MG TABS Take 1 tablet by mouth at bedtime.  . mirabegron ER (MYRBETRIQ) 25 MG TB24 tablet Take 25 mg by mouth daily.  . Misc Natural Products (BLACK CHERRY CONCENTRATE PO) Take 2 tablets by mouth daily.  . Multiple Vitamins-Minerals (CENTRUM SILVER ADULT 50+ PO) Take 1 tablet by mouth every morning.   . Omega 3 1200 MG CAPS Take 1 capsule by mouth 2 (two) times daily.    No facility-administered encounter medications on file as of 04/21/2017.     Review of Systems  Constitutional: Positive for fatigue. Negative for activity change, chills, diaphoresis and unexpected weight change.  HENT: Positive for hearing loss. Negative for congestion, trouble swallowing and voice change.   Eyes: Negative for visual disturbance.  Respiratory: Positive for shortness of breath. Negative for cough, choking, chest tightness and wheezing.   Cardiovascular: Positive for leg swelling. Negative for chest pain and palpitations.  Genitourinary: Negative for difficulty urinating, dysuria and urgency.  Musculoskeletal: Positive for gait problem.  Skin: Negative for color change and pallor.  Neurological: Negative for dizziness, weakness and headaches.  Psychiatric/Behavioral: Negative for agitation and behavioral problems.    Immunization History  Administered Date(s) Administered  . Influenza Split 11/07/2009  . Influenza, High Dose Seasonal PF 11/18/2016  . Influenza,inj,quad, With Preservative 01/10/2016  . Influenza-Unspecified 11/09/2012, 11/27/2013, 11/08/2014, 11/21/2015  . Pneumococcal Conjugate-13 01/10/2016  . Pneumococcal Polysaccharide-23 02/09/2002  . Td 08/10/2003  . Tdap 08/06/2013  . Zoster 03/12/2005   Pertinent  Health Maintenance Due  Topic Date Due  . INFLUENZA VACCINE  Completed  . DEXA SCAN  Completed  . PNA vac Low Risk Adult  Completed   Fall Risk  01/07/2017 11/18/2015 06/06/2015 06/06/2015 05/10/2014  Falls in the past year? No No No No No  Number falls in past yr: -  - - - -   Functional Status Survey:    Vitals:   04/22/17 0929  BP: 140/80  Pulse: 90  Resp: (!) 22  Temp: 98 F (36.7 C)  SpO2: (!) 82%   There is no height or weight on file to calculate BMI. Physical Exam  Constitutional: She is oriented to person, place, and time. She appears well-developed and well-nourished.  HENT:  Head: Normocephalic and atraumatic.  Eyes: Conjunctivae and EOM are normal. Pupils are equal, round, and reactive to light.  Neck: Normal range of motion. Neck supple. No JVD present. No thyromegaly present.  Cardiovascular: Normal rate.  No murmur heard. Irregular heart beats.   Pulmonary/Chest: She has no wheezes. She has rales.  O2 desaturation on exertion and night.   Abdominal: Soft. Bowel sounds are normal. She exhibits no distension. There is no tenderness.  Musculoskeletal: She exhibits edema.  Ambulates with walker, trace edema in BLE  Neurological: She is alert and oriented to person, place, and time. She exhibits normal muscle tone. Coordination normal.  Skin: Skin is warm and dry.  Psychiatric: She has a normal mood and affect. Her behavior is normal.    Labs reviewed: Recent Labs    01/04/17 0000 02/04/17  9326 03/11/17 1000 04/02/17 04/22/17  NA 140 140 142 140 141  K 4.0 4.1 3.8 4.5 3.9  CL 104 104 104  --   --   CO2 27 32 29  --   --   GLUCOSE 89 92 107*  --   --   BUN 19 22 15 20 8   CREATININE 0.86 0.76 0.73 0.7 0.6  CALCIUM 9.8 9.8 9.8  --   --    Recent Labs    01/04/17 0000 02/04/17 0655 03/11/17 1000 04/02/17 04/22/17  AST 22 24 28 17 18   ALT 18 23 25 17 16   ALKPHOS  --   --  71 65 77  BILITOT 0.7 0.5 1.0  --   --   PROT 6.1 6.1 6.2*  --   --   ALBUMIN  --   --  3.5  --   --    Recent Labs    01/04/17 0000 02/04/17 0655 03/11/17 1000 04/02/17 04/22/17  WBC 5.4 5.0 4.5 5.3 5.8  HGB 14.1 13.2 13.6 13.3 14.8  HCT 41.5 37.6 41.4 38 43  MCV 94.7 91.3 98.6  --   --   PLT 234 246 204 217 326   Lab Results    Component Value Date   TSH 1.30 01/04/2017   No results found for: HGBA1C Lab Results  Component Value Date   CHOL 150 02/04/2017   HDL 72 02/04/2017   LDLCALC 65 02/04/2017   TRIG 54 02/04/2017   CHOLHDL 2.1 02/04/2017    Significant Diagnostic Results in last 30 days:  No results found.  Assessment/Plan CHF (congestive heart failure), NYHA class I, chronic, diastolic (HCC) SOB at night or on exertion for about week, graduate onset, O2 2lpm via [place] helped. She denied chest pain, cough,  palpitation, she is afebrile. She has history of pulmonary hypertension, echocardiogram 01/29/17  The estimated ejection fraction was in the range of 60% to 65%. Doppler parameters are consistent with abnormal left ventricular relaxation (grade 1 diastolic dysfunction. Continue Furosemide 20mg  daily, update CBC CMP echocardiogram BNP  Pulmonary hypertension Hx of pulmonary hypertension, SOB and O2 desaturation occurred, continue Furosemide 20mg  daily, trace edema seen in BLE.   Nocturnal hypoxemia due to primary pulmonary hypertension (HCC) O2 desaturation at night and exertion, continue Furosemide 20mg  daily, apply O2 @ 2lpm via [place] to maintain Sat o2>89% 04/23/17 2pm: conference: the patient's son Stu, Missy administer, Georg Ruddle RN, Madelin Rear RN, social worker Laren Everts, myself: plan of care, Cardiology, AL setting recommended, may be anxiety is contributory to her cognition change. 04/23/17 wbc 5.8, Hgb 14.8, plt 326, Na 141, K3.9, Bun 8, creat 0.60, BNP36, MMSE 26/30.   Extensive discussion with the patient's POA son, Scientist, physiological, Education officer, museum, Corporate investment banker, and myself: admitting the patient to [facility] for higher level of care due to her lack of sufficient self care at independent living setting.   PAF (paroxysmal atrial fibrillation) (HCC) Afib, heart rate is in control, continue Diltiazem 120mg  daily, Eliquis 2.5mg  bid for thromboembolic risk reduction.    HTN (hypertension)   Her blood pressure is in control, continue Losartan 100mg  qd.   Depression with anxiety Diastolic CHF grade I, pulmonary hypertension, hypoxia, Hx of depression/anxiety contributory to .  AL setting provides more supportive care, close supervision, nursing care helps with her anxiety and escalated health concerns. May consider anxiolytic agents if no better.   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      Family/ staff Communication: plan  of care reviewed with the patient, the patient's POA son, and charge nurse. Admitting to AL for higher level of care.   Labs/tests ordered:  Echocardiogram, CBC, CMP, BNP  Time spend 25 minutes.

## 2017-04-22 NOTE — Progress Notes (Deleted)
This encounter was created in error - please disregard.  This encounter was created in error - please disregard.

## 2017-04-23 ENCOUNTER — Encounter: Payer: Self-pay | Admitting: *Deleted

## 2017-04-23 NOTE — Assessment & Plan Note (Signed)
F/u surgeon  

## 2017-04-23 NOTE — Assessment & Plan Note (Signed)
Diastolic CHF grade I, pulmonary hypertension, hypoxia, Hx of depression/anxiety contributory to .  AL setting provides more supportive care, close supervision, nursing care helps with her anxiety and escalated health concerns. May consider anxiolytic agents if no better.

## 2017-04-23 NOTE — Progress Notes (Signed)
Resident did'nt pass the clock portion of the MMSE.

## 2017-04-30 ENCOUNTER — Encounter: Payer: Self-pay | Admitting: Nurse Practitioner

## 2017-04-30 ENCOUNTER — Non-Acute Institutional Stay: Payer: Medicare Other | Admitting: Nurse Practitioner

## 2017-04-30 DIAGNOSIS — I272 Pulmonary hypertension, unspecified: Secondary | ICD-10-CM

## 2017-04-30 DIAGNOSIS — F418 Other specified anxiety disorders: Secondary | ICD-10-CM | POA: Diagnosis not present

## 2017-04-30 DIAGNOSIS — R4189 Other symptoms and signs involving cognitive functions and awareness: Secondary | ICD-10-CM

## 2017-04-30 DIAGNOSIS — I5032 Chronic diastolic (congestive) heart failure: Secondary | ICD-10-CM | POA: Diagnosis not present

## 2017-04-30 DIAGNOSIS — I480 Paroxysmal atrial fibrillation: Secondary | ICD-10-CM

## 2017-04-30 NOTE — Assessment & Plan Note (Signed)
Increased confusion, obsessed with her medications, her charge nurse went over and explained to her about her medications, so as myself during today's visit. She is adjusting to [place] from [place] [facility] recently. She denied headache, dizziness, slurred speech, chest pain/pressure, palpitation, dysuria, or abd pain. She is afebrile. Chronic O2 use for hypoxemia related to pulmonary hypertension. Hypoxemia, anxiety, and adjusting may be contributory. Observe.

## 2017-04-30 NOTE — Progress Notes (Signed)
[facility] Room Number: 355 Place of Service:  ALF (13)[facility] Provider: Lennie Odor Von Quintanar NP  Gabreille Dardis X, NP  Patient Care Team: Severo Beber X, NP as PCP - General (Internal Medicine) Iyauna Sing X, NP as Nurse Practitioner (Nurse Practitioner) Bo Merino, MD as Consulting Physician (Rheumatology) Curly Rim, MD as Consulting Physician (Internal Medicine) Ernst Spell, MD as Referring Physician (Urology) Charlotte Crumb, MD as Consulting Physician (Orthopedic Surgery) Festus Aloe, MD as Consulting Physician (Urology)  Extended Emergency Contact Information Primary Emergency Contact: [facility] Address: [address]          [place], [place] [address] [name] of [place] Phone: [phone] Mobile Phone: [phone] Relation: Son Secondary Emergency Contact: Alessandra Grout [place] of [place] Phone: [phone] Relation: Other  Code Status: DNR Goals of care: Advanced Directive information Advanced Directives 04/21/2017  Does Patient Have a Medical Advance Directive? No  Type of Advance Directive -  Does patient want to make changes to medical advance directive? -  Copy of [place] in Chart? -  Would patient like information on creating a medical advance directive? No - Patient declined     Chief Complaint  Patient presents with  . Acute Visit    Increased confusion    HPI:  Pt is a 84 y.o. female seen today for an acute visit for increased confusion, obsessed with her medications, her charge nurse went over and explained to her about her medications, so as myself during today's visit. She is adjusting to [place] from [place] [facility] recently. She denied headache, dizziness, slurred speech, chest pain/pressure, palpitation, dysuria, or abd pain. She is afebrile. Chronic O2 use for hypoxemia related to pulmonary hypertension.    Past Medical History:  Diagnosis Date  . Abnormality of gait  12/30/2015  . Arthritis   . Asthma   . Atrial fibrillation ([place])   . DD (diverticular disease) 10/26/2013  . 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 Diverticulitis   . Edema 12/30/2015  . Gout   . Gout 12/30/2015  . History of CVA (cerebrovascular accident)    Left basal ganglia   . Hyperlipemia   . Hypertension   . Liposarcoma ([place]) 12/20/2015   excised 12/20/15  . Mitral valve regurgitation   . Mitral valve regurgitation 12/30/2015  . OP (osteoporosis) 10/26/2013  . PAF (paroxysmal atrial fibrillation) ([place]) 07/28/2013  . Pseudogout of knee 07/28/2013   03/16/13 Uric acid 5.5   . Spondylosis of cervical joint 12/30/2015  . Spondylosis of cervical spine   . TI (tricuspid incompetence) 05/18/2011   Overview:  Moderate severe   . Unspecified vitamin D deficiency 07/28/2013   03/16/13 Vit D 24.6   . Urinary incontinence without sensory awareness 07/28/2013  . Venous (peripheral) insufficiency 02/22/2014   Past Surgical History:  Procedure Laterality Date  . COLONOSCOPY  2011   Dr. Cindie Laroche Jule Ser)  . remove fatty tumor  2017  . THUMB ARTHROSCOPY Right 2014   Dr. Burney Gauze  . TONSILLECTOMY  1938    Allergies  Allergen Reactions  . Ace Inhibitors Cough    Cough  . Codeine Nausea And Vomiting  . Penicillins Hives and Rash  . Sulfa Antibiotics Hives and Rash    Allergies as of 04/30/2017      Reactions   Ace Inhibitors Cough   Cough   Codeine Nausea And Vomiting   Penicillins Hives, Rash   Sulfa Antibiotics Hives, Rash  Medication List        Accurate as of 04/30/17 11:59 PM. Always use your most recent med list.          acetaminophen 500 MG tablet Commonly known as:  TYLENOL Take 500 mg by mouth daily as needed.   apixaban 2.5 MG Tabs tablet Commonly known as:  ELIQUIS Take 1 tablet (2.5 mg total) by mouth 2 (two) times daily.   BLACK CHERRY CONCENTRATE PO Take 2 tablets by mouth daily.   CENTRUM SILVER ADULT 50+ PO Take 1 tablet by mouth every  morning.   diltiazem 120 MG 24 hr capsule Commonly known as:  CARDIZEM CD TAKE 1 CAPSULE DAILY.   furosemide 20 MG tablet Commonly known as:  LASIX Take 20 mg by mouth daily.   losartan 100 MG tablet Commonly known as:  COZAAR TAKE 1 TABLET DAILY TO CONTROL BLOOD PRESSURE.   Melatonin 5 MG Tabs Take 1 tablet by mouth at bedtime.   MYRBETRIQ 25 MG Tb24 tablet Generic drug:  mirabegron ER Take 25 mg by mouth daily.   Omega 3 1200 MG Caps Take 1 capsule by mouth 2 (two) times daily.       Review of Systems  Constitutional: Negative for activity change, appetite change, chills, diaphoresis, fatigue and fever.  HENT: Positive for hearing loss. Negative for congestion, trouble swallowing and voice change.   Respiratory: Positive for shortness of breath. Negative for cough, choking, chest tightness and wheezing.   Cardiovascular: Positive for leg swelling. Negative for chest pain and palpitations.  Gastrointestinal: Negative for abdominal distention, abdominal pain, constipation, diarrhea, nausea and vomiting.  Genitourinary: Negative for difficulty urinating, dysuria and urgency.  Musculoskeletal: Positive for gait problem.  Skin: Negative for color change.  Neurological: Negative for speech difficulty, weakness and headaches.       Repetitive.   Psychiatric/Behavioral: Negative for agitation, behavioral problems, confusion, hallucinations and sleep disturbance. The patient is nervous/anxious.        Repetitive, obsession.     Immunization History  Administered Date(s) Administered  . Influenza Split 11/07/2009  . Influenza, High Dose Seasonal PF 11/18/2016  . Influenza,inj,quad, With Preservative 01/10/2016  . Influenza-Unspecified 11/09/2012, 11/27/2013, 11/08/2014, 11/21/2015  . Pneumococcal Conjugate-13 01/10/2016  . Pneumococcal Polysaccharide-23 02/09/2002  . Td 08/10/2003  . Tdap 08/06/2013  . Zoster 03/12/2005   Pertinent  Health Maintenance Due  Topic Date  Due  . INFLUENZA VACCINE  Completed  . DEXA SCAN  Completed  . PNA vac Low Risk Adult  Completed   Fall Risk  01/07/2017 11/18/2015 06/06/2015 06/06/2015 05/10/2014  Falls in the past year? No No No No No  Number falls in past yr: - - - - -   Functional Status Survey:    Vitals:   04/30/17 1532  BP: 138/68  Pulse: 86  Resp: 20  Temp: 99.1 F (37.3 C)  SpO2: 93%  Weight: 133 lb (60.3 kg)  Height: 5\' 2"  (1.575 m)   Body mass index is 24.33 kg/m. Physical Exam  Constitutional: She is oriented to person, place, and time. She appears well-developed and well-nourished.  HENT:  Head: Normocephalic and atraumatic.  Eyes: Pupils are equal, round, and reactive to light. Conjunctivae and EOM are normal.  Neck: Normal range of motion. Neck supple. No JVD present. No thyromegaly present.  Cardiovascular: Normal rate.  No murmur heard. Irregular heart beats.   Pulmonary/Chest: Effort normal. She has no wheezes. She has no rales.  Decreased air entry, DOE, O2 via   Abdominal: Soft. Bowel sounds are normal. She exhibits no distension. There is no tenderness. There is no rebound and no guarding.  Musculoskeletal: She exhibits edema. She exhibits no tenderness.  Ambulates with walker. Trace edema BLE  Neurological: She is alert and oriented to person, place, and time. She exhibits normal muscle tone. Coordination normal.  Skin: Skin is warm and dry.  Psychiatric:  Repetitive, appears anxious    Labs reviewed: Recent Labs    01/04/17 0000 02/04/17 0655 03/11/17 1000 04/02/17 04/22/17  NA 140 140 142 140 141  K 4.0 4.1 3.8 4.5 3.9  CL 104 104 104  --   --   CO2 27 32 29  --   --   GLUCOSE 89 92 107*  --   --   BUN 19 22 15 20 8   CREATININE 0.86 0.76 0.73 0.7 0.6  CALCIUM 9.8 9.8 9.8  --   --    Recent Labs    01/04/17 0000 02/04/17 0655 03/11/17 1000 04/02/17 04/22/17  AST 22 24 28 17 18   ALT 18 23 25 17 16   ALKPHOS  --   --  71 65 77  BILITOT 0.7 0.5 1.0  --   --     PROT 6.1 6.1 6.2*  --   --   ALBUMIN  --   --  3.5  --   --    Recent Labs    01/04/17 0000 02/04/17 0655 03/11/17 1000 04/02/17 04/22/17  WBC 5.4 5.0 4.5 5.3 5.8  HGB 14.1 13.2 13.6 13.3 14.8  HCT 41.5 37.6 41.4 38 43  MCV 94.7 91.3 98.6  --   --   PLT 234 246 204 217 326   Lab Results  Component Value Date   TSH 1.30 01/04/2017   No results found for: HGBA1C Lab Results  Component Value Date   CHOL 150 02/04/2017   HDL 72 02/04/2017   LDLCALC 65 02/04/2017   TRIG 54 02/04/2017   CHOLHDL 2.1 02/04/2017    Significant Diagnostic Results in last 30 days:  No results found.  Assessment/Plan: Cognitive impairment Increased confusion, obsessed with her medications, her charge nurse went over and explained to her about her medications, so as myself during today's visit. She is adjusting to [place] from [place] [facility] recently. She denied headache, dizziness, slurred speech, chest pain/pressure, palpitation, dysuria, or abd pain. She is afebrile. Chronic O2 use for hypoxemia related to pulmonary hypertension. Hypoxemia, anxiety, and adjusting may be contributory. Observe.   Depression with anxiety Obsessed with medications and many short lived health concerns, but she is able to sleep, eat, and participate activities on unit. May consider low dose anti depressant/anxiolytic agent to facilitate her adjustment to AL.   Pulmonary hypertension Stable, continue O2 via [place] and Furosemide daily.   CHF (congestive heart failure), NYHA class I, chronic, diastolic (HCC) Compensated clinically. Continue Furosemide.   PAF (paroxysmal atrial fibrillation) (HCC) Heart rate is in control, continue Diltiazem, Eliquis.     Family/ staff Communication: plan of care reviewed with the patient and charge nurse.   Labs/tests ordered:  None  Time spend 25 minutes.

## 2017-04-30 NOTE — Assessment & Plan Note (Signed)
Obsessed with medications and many short lived health concerns, but she is able to sleep, eat, and participate activities on unit. May consider low dose anti depressant/anxiolytic agent to facilitate her adjustment to AL.

## 2017-04-30 NOTE — Assessment & Plan Note (Signed)
Stable, continue O2 via [place] and Furosemide daily.

## 2017-04-30 NOTE — Progress Notes (Signed)
Location:  [facility] Room Number: 606 Place of Service:  ALF [phone]) Provider:  Mast, Manxie  NP  Mast, Man X, NP  Patient Care Team: Mast, Man X, NP as PCP - General (Internal Medicine) Mast, Man X, NP as Nurse Practitioner (Nurse Practitioner) Bo Merino, MD as Consulting Physician (Rheumatology) Curly Rim, MD as Consulting Physician (Internal Medicine) Ernst Spell, MD as Referring Physician (Urology) Charlotte Crumb, MD as Consulting Physician (Orthopedic Surgery) Festus Aloe, MD as Consulting Physician (Urology)  Extended Emergency Contact Information Primary Emergency Contact: [facility] Address: [address]          [place], [place] [address] [name] of [place] Phone: [phone] Mobile Phone: [phone] Relation: Son Secondary Emergency Contact: Alessandra Grout [place] of [place] Phone: [phone] Relation: Other  Code Status:  Full Code Goals of care: Advanced Directive information Advanced Directives 04/21/2017  Does Patient Have a Medical Advance Directive? No  Type of Advance Directive -  Does patient want to make changes to medical advance directive? -  Copy of [place] in Chart? -  Would patient like information on creating a medical advance directive? No - Patient declined     Chief Complaint  Patient presents with  . Acute Visit    Increased confusion    HPI:  Pt is a 84 y.o. female seen today for an acute visit for    Past Medical History:  Diagnosis Date  . Abnormality of gait 12/30/2015  . Arthritis   . Asthma   . Atrial fibrillation ([place])   . DD (diverticular disease) 10/26/2013  . 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 Diverticulitis   . Edema 12/30/2015  . Gout   . Gout 12/30/2015  . History of CVA (cerebrovascular accident)    Left basal ganglia   . Hyperlipemia   . Hypertension   . Liposarcoma ([place]) 12/20/2015   excised  12/20/15  . Mitral valve regurgitation   . Mitral valve regurgitation 12/30/2015  . OP (osteoporosis) 10/26/2013  . PAF (paroxysmal atrial fibrillation) ([place]) 07/28/2013  . Pseudogout of knee 07/28/2013   03/16/13 Uric acid 5.5   . Spondylosis of cervical joint 12/30/2015  . Spondylosis of cervical spine   . TI (tricuspid incompetence) 05/18/2011   Overview:  Moderate severe   . Unspecified vitamin D deficiency 07/28/2013   03/16/13 Vit D 24.6   . Urinary incontinence without sensory awareness 07/28/2013  . Venous (peripheral) insufficiency 02/22/2014   Past Surgical History:  Procedure Laterality Date  . COLONOSCOPY  2011   Dr. Cindie Laroche Jule Ser)  . remove fatty tumor  2017  . THUMB ARTHROSCOPY Right 2014   Dr. Burney Gauze  . TONSILLECTOMY  1938    Allergies  Allergen Reactions  . Ace Inhibitors Cough    Cough  . Codeine Nausea And Vomiting  . Penicillins Hives and Rash  . Sulfa Antibiotics Hives and Rash    Outpatient Encounter Medications as of 04/30/2017  Medication Sig  . acetaminophen (TYLENOL) 500 MG tablet Take 500 mg by mouth daily as needed.  Marland Kitchen apixaban (ELIQUIS) 2.5 MG TABS tablet Take 1 tablet (2.5 mg total) by mouth 2 (two) times daily.  Marland Kitchen diltiazem (CARDIZEM CD) 120 MG 24 hr capsule TAKE 1 CAPSULE DAILY.  . furosemide (LASIX) 20 MG tablet Take 20 mg by mouth daily.  Marland Kitchen losartan (COZAAR) 100 MG tablet TAKE 1 TABLET DAILY TO CONTROL BLOOD PRESSURE.  . Melatonin 5 MG TABS Take 1  tablet by mouth at bedtime.  . mirabegron ER (MYRBETRIQ) 25 MG TB24 tablet Take 25 mg by mouth daily.  . Misc Natural Products (BLACK CHERRY CONCENTRATE PO) Take 2 tablets by mouth daily.  . Multiple Vitamins-Minerals (CENTRUM SILVER ADULT 50+ PO) Take 1 tablet by mouth every morning.   . Omega 3 1200 MG CAPS Take 1 capsule by mouth 2 (two) times daily.    No facility-administered encounter medications on file as of 04/30/2017.     Review of Systems  Immunization History  Administered  Date(s) Administered  . Influenza Split 11/07/2009  . Influenza, High Dose Seasonal PF 11/18/2016  . Influenza,inj,quad, With Preservative 01/10/2016  . Influenza-Unspecified 11/09/2012, 11/27/2013, 11/08/2014, 11/21/2015  . Pneumococcal Conjugate-13 01/10/2016  . Pneumococcal Polysaccharide-23 02/09/2002  . Td 08/10/2003  . Tdap 08/06/2013  . Zoster 03/12/2005   Pertinent  Health Maintenance Due  Topic Date Due  . INFLUENZA VACCINE  Completed  . DEXA SCAN  Completed  . PNA vac Low Risk Adult  Completed   Fall Risk  01/07/2017 11/18/2015 06/06/2015 06/06/2015 05/10/2014  Falls in the past year? No No No No No  Number falls in past yr: - - - - -   Functional Status Survey:    Vitals:   04/30/17 1532  BP: 138/68  Pulse: 86  Resp: 20  Temp: 99.1 F (37.3 C)  SpO2: 93%  Weight: 133 lb (60.3 kg)  Height: 5\' 2"  (1.575 m)   Body mass index is 24.33 kg/m. Physical Exam  Labs reviewed: Recent Labs    01/04/17 0000 02/04/17 0655 03/11/17 1000 04/02/17 04/22/17  NA 140 140 142 140 141  K 4.0 4.1 3.8 4.5 3.9  CL 104 104 104  --   --   CO2 27 32 29  --   --   GLUCOSE 89 92 107*  --   --   BUN 19 22 15 20 8   CREATININE 0.86 0.76 0.73 0.7 0.6  CALCIUM 9.8 9.8 9.8  --   --    Recent Labs    01/04/17 0000 02/04/17 0655 03/11/17 1000 04/02/17 04/22/17  AST 22 24 28 17 18   ALT 18 23 25 17 16   ALKPHOS  --   --  71 65 77  BILITOT 0.7 0.5 1.0  --   --   PROT 6.1 6.1 6.2*  --   --   ALBUMIN  --   --  3.5  --   --    Recent Labs    01/04/17 0000 02/04/17 0655 03/11/17 1000 04/02/17 04/22/17  WBC 5.4 5.0 4.5 5.3 5.8  HGB 14.1 13.2 13.6 13.3 14.8  HCT 41.5 37.6 41.4 38 43  MCV 94.7 91.3 98.6  --   --   PLT 234 246 204 217 326   Lab Results  Component Value Date   TSH 1.30 01/04/2017   No results found for: HGBA1C Lab Results  Component Value Date   CHOL 150 02/04/2017   HDL 72 02/04/2017   LDLCALC 65 02/04/2017   TRIG 54 02/04/2017   CHOLHDL 2.1 02/04/2017     Significant Diagnostic Results in last 30 days:  No results found.  Assessment/Plan There are no diagnoses linked to this encounter.   Family/ staff Communication:   Labs/tests ordered:

## 2017-04-30 NOTE — Assessment & Plan Note (Signed)
Compensated clinically. Continue Furosemide 

## 2017-04-30 NOTE — Assessment & Plan Note (Signed)
Heart rate is in control, continue Diltiazem, Eliquis.  °

## 2017-05-10 ENCOUNTER — Non-Acute Institutional Stay: Payer: Medicare Other | Admitting: Nurse Practitioner

## 2017-05-10 ENCOUNTER — Encounter: Payer: Self-pay | Admitting: Nurse Practitioner

## 2017-05-10 DIAGNOSIS — C480 Malignant neoplasm of retroperitoneum: Secondary | ICD-10-CM | POA: Diagnosis not present

## 2017-05-10 DIAGNOSIS — R1033 Periumbilical pain: Secondary | ICD-10-CM | POA: Diagnosis not present

## 2017-05-10 DIAGNOSIS — K5900 Constipation, unspecified: Secondary | ICD-10-CM | POA: Insufficient documentation

## 2017-05-10 DIAGNOSIS — R109 Unspecified abdominal pain: Secondary | ICD-10-CM | POA: Insufficient documentation

## 2017-05-10 NOTE — Assessment & Plan Note (Addendum)
Adding Miralax 17gm mixed with 4 oz fluids

## 2017-05-10 NOTE — Assessment & Plan Note (Signed)
Hx of liposarcoma excision, last f/u surgeon was 12/2016. F/u as needed.

## 2017-05-10 NOTE — Progress Notes (Addendum)
Location:  [facility] Room Number: 956 Place of Service:  ALF [phone]) Provider: Marlana Latus NP  Yamen Castrogiovanni X, NP  Patient Care Team: Zarif Rathje X, NP as PCP - General (Internal Medicine) Nahser, Wonda Cheng, MD as PCP - Cardiology (Cardiology) Faithlynn Deeley X, NP as Nurse Practitioner (Nurse Practitioner) Bo Merino, MD as Consulting Physician (Rheumatology) Curly Rim, MD as Consulting Physician (Internal Medicine) Ernst Spell, MD as Referring Physician (Urology) Charlotte Crumb, MD as Consulting Physician (Orthopedic Surgery) Festus Aloe, MD as Consulting Physician (Urology)  Extended Emergency Contact Information Primary Emergency Contact: [facility] Address: [address]          [place], [place] [address] [name] of [place] Phone: [phone] Mobile Phone: [phone] Relation: Son Secondary Emergency Contact: Alessandra Grout States of [place] Phone: [phone] Relation: Other  Code Status:  Full Code Goals of care: Advanced Directive information Advanced Directives 05/10/2017  Does Patient Have a Medical Advance Directive? Yes  Type of Advance Directive [place]  Does patient want to make changes to medical advance directive? No - Patient declined  Copy of [place] in Chart? Yes  Would patient like information on creating a medical advance directive? No - Patient declined     Chief Complaint  Patient presents with  . Acute Visit    Abdominal pain    HPI:  Pt is a 84 y.o. female seen today for an acute visit for the patient's complaining of abdominal pain for 3-4 weeks, her son was at bedside and endorsed her complaints, she admitted  Sometime abd pain was accompanied with nausea, but no vomiting. Also she stated she is constipated, she is self toileing, staff gave her some laxative and had a good result a couple of days ago,  but staff reported the patient  uses bathroom in am regularly, has smell of BM. Her abd pain is on the right side of umbilicus, no pain palpated upon my visit, no guarding or rebound tenderness appreciated today. Her weight is stable # 134Ibs, #134Ibs, #133Ibs the last weekly weights. Hx of liposarcoma excision, last f/u surgeon was 12/2016   Past Medical History:  Diagnosis Date  . Abnormality of gait 12/30/2015  . Arthritis   . Asthma   . Atrial fibrillation ([place])   . DD (diverticular disease) 10/26/2013  . 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 Diverticulitis   . Edema 12/30/2015  . Gout   . Gout 12/30/2015  . History of CVA (cerebrovascular accident)    Left basal ganglia   . Hyperlipemia   . Hypertension   . Liposarcoma ([place]) 12/20/2015   excised 12/20/15  . Mitral valve regurgitation   . Mitral valve regurgitation 12/30/2015  . OP (osteoporosis) 10/26/2013  . PAF (paroxysmal atrial fibrillation) ([place]) 07/28/2013  . Pseudogout of knee 07/28/2013   03/16/13 Uric acid 5.5   . Spondylosis of cervical joint 12/30/2015  . Spondylosis of cervical spine   . TI (tricuspid incompetence) 05/18/2011   Overview:  Moderate severe   . Unspecified vitamin D deficiency 07/28/2013   03/16/13 Vit D 24.6   . Urinary incontinence without sensory awareness 07/28/2013  . Venous (peripheral) insufficiency 02/22/2014   Past Surgical History:  Procedure Laterality Date  . COLONOSCOPY  2011   Dr. Cindie Laroche Jule Ser)  . remove fatty tumor  2017  . THUMB ARTHROSCOPY Right 2014   Dr. Burney Gauze  . TONSILLECTOMY  1938    Allergies  Allergen Reactions  . Ace Inhibitors Cough    Cough  . Codeine Nausea And Vomiting  . Penicillins Hives and Rash  . Sulfa Antibiotics Hives and Rash    Outpatient Encounter Medications as of 05/10/2017  Medication Sig  . acetaminophen (TYLENOL) 500 MG tablet Take 500 mg by mouth daily as needed.  Marland Kitchen apixaban (ELIQUIS) 2.5 MG TABS tablet Take 1 tablet (2.5 mg total) by mouth 2 (two) times daily.    . Melatonin 5 MG TABS Take 1 tablet by mouth at bedtime.  . mirabegron ER (MYRBETRIQ) 25 MG TB24 tablet Take 25 mg by mouth daily.  . Misc Natural Products (BLACK CHERRY CONCENTRATE PO) Take 2 tablets by mouth daily.  . Multiple Vitamins-Minerals (CENTRUM SILVER ADULT 50+ PO) Take 1 tablet by mouth every morning.   . Omega 3 1200 MG CAPS Take 1 capsule by mouth 2 (two) times daily.   . [DISCONTINUED] diltiazem (CARDIZEM CD) 120 MG 24 hr capsule TAKE 1 CAPSULE DAILY. (Patient not taking: Reported on 05/12/2017)  . [DISCONTINUED] furosemide (LASIX) 20 MG tablet Take 20 mg by mouth daily.  . [DISCONTINUED] losartan (COZAAR) 100 MG tablet TAKE 1 TABLET DAILY TO CONTROL BLOOD PRESSURE. (Patient not taking: Reported on 05/12/2017)   No facility-administered encounter medications on file as of 05/10/2017.     Review of Systems  Constitutional: Negative for activity change, appetite change, chills, diaphoresis, fatigue, fever and unexpected weight change.  HENT: Positive for hearing loss. Negative for congestion, trouble swallowing and voice change.   Respiratory: Positive for cough and shortness of breath. Negative for chest tightness and wheezing.   Cardiovascular: Positive for leg swelling. Negative for chest pain and palpitations.  Gastrointestinal: Positive for abdominal pain, constipation and nausea. Negative for abdominal distention, blood in stool, diarrhea and vomiting.  Genitourinary: Negative for difficulty urinating, dysuria and urgency.  Musculoskeletal: Positive for gait problem. Negative for joint swelling.  Skin: Negative for color change and pallor.  Neurological: Negative for speech difficulty, weakness and headaches.  Psychiatric/Behavioral: Negative for agitation, behavioral problems, confusion, hallucinations and sleep disturbance. The patient is not nervous/anxious.     Immunization History  Administered Date(s) Administered  . Influenza Split 11/07/2009  . Influenza, High Dose  Seasonal PF 11/18/2016  . Influenza,inj,quad, With Preservative 01/10/2016  . Influenza-Unspecified 11/09/2012, 11/27/2013, 11/08/2014, 11/21/2015  . Pneumococcal Conjugate-13 01/10/2016  . Pneumococcal Polysaccharide-23 02/09/2002  . Td 08/10/2003  . Tdap 08/06/2013  . Zoster 03/12/2005   Pertinent  Health Maintenance Due  Topic Date Due  . INFLUENZA VACCINE  09/09/2017  . DEXA SCAN  Completed  . PNA vac Low Risk Adult  Completed   Fall Risk  01/07/2017 11/18/2015 06/06/2015 06/06/2015 05/10/2014  Falls in the past year? No No No No No  Number falls in past yr: - - - - -   Functional Status Survey:    Vitals:   05/10/17 1211  BP: 112/86  Pulse: 72  Resp: 20  Temp: 99 F (37.2 C)  SpO2: 94%  Weight: 134 lb (60.8 kg)  Height: 5\' 2"  (1.575 m)   Body mass index is 24.51 kg/m. Physical Exam  Constitutional: She appears well-developed and well-nourished. No distress.  HENT:  Head: Normocephalic and atraumatic.  Eyes: Pupils are equal, round, and reactive to light. Conjunctivae and EOM are normal. No scleral icterus.  Neck: Normal range of motion. Neck supple. No thyromegaly present.  Cardiovascular: Normal rate.  No murmur heard. Irregular heart beats.   Pulmonary/Chest: She has no  wheezes. She has no rales.  Decreased air entry  Abdominal: Soft. Bowel sounds are normal. She exhibits no distension. There is no tenderness. There is no rebound and no guarding.  Musculoskeletal: She exhibits edema.  Trace edema in ankles.   Neurological: She is alert. She exhibits normal muscle tone. Coordination normal.  Oriented to persona and place.   Skin: Skin is warm and dry. No rash noted. She is not diaphoretic. No erythema.  Psychiatric: She has a normal mood and affect. Her behavior is normal.    Labs reviewed: Recent Labs    01/04/17 0000 02/04/17 0655 03/11/17 1000 04/02/17 04/22/17  NA 140 140 142 140 141  K 4.0 4.1 3.8 4.5 3.9  CL 104 104 104  --   --   CO2 27 32 29   --   --   GLUCOSE 89 92 107*  --   --   BUN 19 22 15 20 8   CREATININE 0.86 0.76 0.73 0.7 0.6  CALCIUM 9.8 9.8 9.8  --   --    Recent Labs    01/04/17 0000 02/04/17 0655 03/11/17 1000 04/02/17 04/22/17  AST 22 24 28 17 18   ALT 18 23 25 17 16   ALKPHOS  --   --  71 65 77  BILITOT 0.7 0.5 1.0  --   --   PROT 6.1 6.1 6.2*  --   --   ALBUMIN  --   --  3.5  --   --    Recent Labs    01/04/17 0000 02/04/17 0655 03/11/17 1000 04/02/17 04/22/17  WBC 5.4 5.0 4.5 5.3 5.8  HGB 14.1 13.2 13.6 13.3 14.8  HCT 41.5 37.6 41.4 38 43  MCV 94.7 91.3 98.6  --   --   PLT 234 246 204 217 326   Lab Results  Component Value Date   TSH 1.30 01/04/2017   No results found for: HGBA1C Lab Results  Component Value Date   CHOL 150 02/04/2017   HDL 72 02/04/2017   LDLCALC 65 02/04/2017   TRIG 54 02/04/2017   CHOLHDL 2.1 02/04/2017    Significant Diagnostic Results in last 30 days:  No results found.  Assessment/Plan Abdominal pain May consider KUB, abd [place], CBC/diff, CMP, lipase, am lyse is no better. Monitor abd pain and BM q shift x3 then log to me for further evaluation. Continue weekly weight.   Constipation Adding Miralax 17gm mixed with 4 oz fluids  Retroperitoneal liposarcoma (HCC) Hx of liposarcoma excision, last f/u surgeon was 12/2016. F/u as needed.      Family/ staff Communication: plan of care reviewed with the patient, the patient's POA son, and charge nurse.   Labs/tests ordered:  May consider KUB, abd [place], CBC/diff, CMP, lipase, am lyse is no better.

## 2017-05-10 NOTE — Assessment & Plan Note (Signed)
May consider KUB, abd [place], CBC/diff, CMP, lipase, am lyse is no better. Monitor abd pain and BM q shift x3 then log to me for further evaluation. Continue weekly weight.

## 2017-05-12 ENCOUNTER — Ambulatory Visit (INDEPENDENT_AMBULATORY_CARE_PROVIDER_SITE_OTHER): Payer: Medicare Other | Admitting: Cardiovascular Disease

## 2017-05-12 ENCOUNTER — Encounter: Payer: Self-pay | Admitting: Cardiovascular Disease

## 2017-05-12 VITALS — BP 110/58 | HR 61 | Ht 62.0 in | Wt 132.4 lb

## 2017-05-12 DIAGNOSIS — I5032 Chronic diastolic (congestive) heart failure: Secondary | ICD-10-CM | POA: Diagnosis not present

## 2017-05-12 DIAGNOSIS — I480 Paroxysmal atrial fibrillation: Secondary | ICD-10-CM | POA: Diagnosis not present

## 2017-05-12 MED ORDER — FUROSEMIDE 20 MG PO TABS: ORAL_TABLET | ORAL | 3 refills | Status: DC

## 2017-05-12 MED ORDER — ALBUTEROL SULFATE HFA 108 (90 BASE) MCG/ACT IN AERS: 2.0000 | INHALATION_SPRAY | Freq: Four times a day (QID) | RESPIRATORY_TRACT | 2 refills | Status: AC | PRN

## 2017-05-12 NOTE — Patient Instructions (Signed)
Medication Instructions:  Your physician has recommended you make the following change in your medication:   START Albuterol inhaler 2 puffs every 6 hours as needed for wheezing or shortness of breath DECREASE Lasix (Furosemide) to 20 mg on Mondays, Wednesdays and Fridays only   Labwork: None Ordered   Testing/Procedures: None Ordered   Follow-Up: Your physician recommends that you schedule a follow-up appointment in: 3 months with Dr. Acie Fredrickson   If you need a refill on your cardiac medications before your next appointment, please call your pharmacy.   Thank you for choosing [facility]! Christen Bame, RN [phone]

## 2017-05-12 NOTE — Progress Notes (Signed)
Cardiology Office Note:    Date:  05/12/2017   ID:  Diana Petersen, DOB Aug 07, 1929, MRN [id number]  PCP:  Mast, Man X, NP  Cardiologist:  Mertie Moores, MD    Referring MD: Mast, Man X, NP   No chief complaint on file.   Problem list 1. Paroxysmal Atrial fibrillation 2. Pulmonary hypertension 3.  Essential HTN       Diana Petersen is a 84 y.o. female with a hx of  Paroxysmal atrial fib  She is previously seen by [facility] Cl[facility]ia [facility]uff at  [facility].   Has pulmonary HTN mentioned in her notes.  Has some DOE   Lives at [facility] ( independent)  Still does all of her normal activities.   Still drives short distances ( limited by her old car)   May 12, 2017:  Diana Petersen is seen today for follow up visit .  Seen with son, Diana Petersen.  She has a history of paroxysmal atrial fibrillation.  She has a history of chronic diastolic dysfunction  ( grade 1 diastolic dysfunction) and is on Lasix.  She has had lots of problems with constipation.  Has occasional episodes of DOE,    No significant leg swelling  Was in independent living ,   Now is in assisted living     Past Medical History:  Diagnosis Date  . Abnormality of gait 12/30/2015  . Arthritis   . Asthma   . Atrial fibrillation ([place])   . DD (diverticular disease) 10/26/2013  . 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 Diverticulitis   . Edema 12/30/2015  . Gout   . Gout 12/30/2015  . History of CVA (cerebrovascular accident)    Left basal ganglia   . Hyperlipemia   . Hypertension   . Liposarcoma ([place]) 12/20/2015   excised 12/20/15  . Mitral valve regurgitation   . Mitral valve regurgitation 12/30/2015  . OP (osteoporosis) 10/26/2013  . PAF (paroxysmal atrial fibrillation) ([place]) 07/28/2013  . Pseudogout of knee 07/28/2013   03/16/13 Uric acid 5.5   . Spondylosis of cervical joint 12/30/2015  . Spondylosis of cervical spine   . TI (tricuspid incompetence) 05/18/2011   Overview:  Moderate severe   . Unspecified vitamin D  deficiency 07/28/2013   03/16/13 Vit D 24.6   . Urinary incontinence without sensory awareness 07/28/2013  . Venous (peripheral) insufficiency 02/22/2014    Past Surgical History:  Procedure Laterality Date  . COLONOSCOPY  2011   Dr. Cindie Laroche Jule Ser)  . remove fatty tumor  2017  . THUMB ARTHROSCOPY Right 2014   Dr. Burney Gauze  . TONSILLECTOMY  1938    Current Medications: Current Meds  Medication Sig  . acetaminophen (TYLENOL) 500 MG tablet Take 500 mg by mouth daily as needed.  Marland Kitchen apixaban (ELIQUIS) 2.5 MG TABS tablet Take 1 tablet (2.5 mg total) by mouth 2 (two) times daily.  Marland Kitchen diltiazem (CARDIZEM) 120 MG tablet Take 120 mg by mouth daily.  Marland Kitchen losartan (COZAAR) 100 MG tablet Take 100 mg by mouth daily.  . Melatonin 5 MG TABS Take 1 tablet by mouth at bedtime.  . mirabegron ER (MYRBETRIQ) 25 MG TB24 tablet Take 25 mg by mouth daily.  . Misc Natural Products (BLACK CHERRY CONCENTRATE PO) Take 2 tablets by mouth daily.  . Multiple Vitamins-Minerals (CENTRUM SILVER ADULT 50+ PO) Take 1 tablet by mouth every morning.   . Omega 3 1200 MG CAPS Take 1 capsule by mouth 2 (two) times daily.   . [DISCONTINUED] furosemide (LASIX) 20  MG tablet Take 20 mg by mouth daily.     Allergies:   Ace inhibitors; Codeine; Penicillins; and Sulfa antibiotics   Social History   Socioeconomic History  . Marital status: Widowed    Spouse name: Not on file  . Number of children: Not on file  . Years of education: Not on file  . Highest education level: Not on file  Occupational History  . Occupation: retired Doctor, hospital  . Financial resource strain: Not hard at all  . Food insecurity:    Worry: Never true    Inability: Never true  . Transportation needs:    Medical: No    Non-medical: No  Tobacco Use  . Smoking status: Former Smoker    Last attempt to quit: 07/28/1979    Years since quitting: 37.8  . Smokeless tobacco: Never Used  Substance and Sexual Activity  . Alcohol  use: No    Comment: Has not drank in months.   . Drug use: No  . Sexual activity: Never  Lifestyle  . Physical activity:    Days per week: 2 days    Minutes per session: 30 min  . Stress: Only a little  Relationships  . Social connections:    Talks on phone: More than three times a week    Gets together: More than three times a week    Attends religious service: 1 to 4 times per year    Active member of club or organization: Yes    Attends meetings of clubs or organizations: 1 to 4 times per year    Relationship status: Widowed  Other Topics Concern  . Not on file  Social History Narrative   Lives at [facility] since 2013   Widowed   Former smoker, stopped 1981   Alcohol one glass of wine couple nights a week   Exercise pool exercise 3 times a week   POA              Family History: The patient's family history includes Alzheimer's disease in her mother; Heart disease in her brother and father. ROS:   Please see the history of present illness.     All other systems reviewed and are negative.  Physical Exam: Blood pressure (!) 110/58, pulse 61, height 5\' 2"  (1.575 m), weight 132 lb 6.4 oz (60.1 kg), SpO2 93 %.  GEN:  Well nourished, well developed in no acute distress HEENT: Normal NECK: No JVD; No carotid bruits LYMPHATICS: No lymphadenopathy CARDIAC: RR, soft systolic murmur  RESPIRATORY:   Bilateral wheezing  ABDOMEN: Soft, non-tender, non-distended MUSCULOSKELETAL:  No edema; No deformity  SKIN: Warm and dry NEUROLOGIC:  Alert and oriented x 3    EKGs/Labs/Other Studies Reviewed:    The following studies were reviewed today:   EKG:  EKG is  ordered today.  The ekg ordered today demonstrates :  Aug. 17, 2018:  NSR at 31.   No ST or T wave change   Recent Labs: 01/04/2017: TSH 1.30 04/22/2017: ALT 16; BUN 8; Creatinine 0.6; Hemoglobin 14.8; Platelets 326; Potassium 3.9; Sodium 141  Recent Lipid Panel    Component Value Date/Time   CHOL 150  02/04/2017 0655   CHOL 196 05/28/2015   TRIG 54 02/04/2017 0655   TRIG 75 05/28/2015   HDL 72 02/04/2017 0655   CHOLHDL 2.1 02/04/2017 0655   VLDL 15 05/28/2015   LDLCALC 65 02/04/2017 0655   LDLCALC 119 05/28/2015  ASSESSMENT:     PLAN:       1. Paroxysmal atrial fibrillation: She remains in normal sinus rhythm. She seems to be doing fairly well. Continue low-dose Eliquis . I'll see her again in 6 months for office visit, CBC, and basic medical profile.  2.  Chronic diastolic congestive heart failure.  She has been on low-dose Lasix.  She still has some mild shortness breath with exertion but her main complaint now is constipation which is led to nausea and poor appetite.  It is possible that that the Lasix may be contributing to this.  We will decrease the Lasix to 20 mg on Mondays, Wednesdays, Fridays and see how she does.  2.  Mild pulmonary hypertension: She will continue to watch her salt.  We will continue Lasix at the lower dose as noted above.  Medication Adjustments/Labs and Tests Ordered: Current medicines are reviewed at length with the patient today.  Concerns regarding medicines are outlined above.  No orders of the defined types were placed in this encounter.  No orders of the defined types were placed in this encounter.   Signed, Mertie Moores, MD  05/12/2017 10:04 AM    [place]

## 2017-06-01 ENCOUNTER — Non-Acute Institutional Stay: Payer: Medicare Other | Admitting: Internal Medicine

## 2017-06-01 ENCOUNTER — Encounter: Payer: Self-pay | Admitting: Internal Medicine

## 2017-06-01 DIAGNOSIS — K5901 Slow transit constipation: Secondary | ICD-10-CM

## 2017-06-01 DIAGNOSIS — I10 Essential (primary) hypertension: Secondary | ICD-10-CM | POA: Diagnosis not present

## 2017-06-01 DIAGNOSIS — I5032 Chronic diastolic (congestive) heart failure: Secondary | ICD-10-CM

## 2017-06-01 DIAGNOSIS — F32A Depression, unspecified: Secondary | ICD-10-CM

## 2017-06-01 DIAGNOSIS — K219 Gastro-esophageal reflux disease without esophagitis: Secondary | ICD-10-CM

## 2017-06-01 DIAGNOSIS — I480 Paroxysmal atrial fibrillation: Secondary | ICD-10-CM | POA: Diagnosis not present

## 2017-06-01 DIAGNOSIS — F320 Major depressive disorder, single episode, mild: Secondary | ICD-10-CM

## 2017-06-01 DIAGNOSIS — R4189 Other symptoms and signs involving cognitive functions and awareness: Secondary | ICD-10-CM

## 2017-06-01 DIAGNOSIS — N3281 Overactive bladder: Secondary | ICD-10-CM | POA: Diagnosis not present

## 2017-06-01 LAB — CMP 10231
Albumin: 3.8
CALCIUM: 10.2
CHLORIDE: 100
CO2: 35
EGFR (Non-African Amer.): 83
GLOBULIN: 2.4
Total Protein: 6.2

## 2017-06-01 NOTE — Progress Notes (Signed)
[facility]  Provider: Blanchie Serve MD   Location:  [place] Room Number: 284 Place of Service:  ALF (13)  PCP: Mast, Man X, NP Patient Care Team: Mast, Man X, NP as PCP - General (Internal Medicine) Nahser, Wonda Cheng, MD as PCP - Cardiology (Cardiology) Mast, Man X, NP as Nurse Practitioner (Nurse Practitioner) Bo Merino, MD as Consulting Physician (Rheumatology) Curly Rim, MD as Consulting Physician (Internal Medicine) Ernst Spell, MD as Referring Physician (Urology) Charlotte Crumb, MD as Consulting Physician (Orthopedic Surgery) Festus Aloe, MD as Consulting Physician (Urology)  Extended Emergency Contact Information Primary Emergency Contact: [facility] Address: [address]          [place], [place] [address] [name] of [place] Phone: [phone] Mobile Phone: [phone] Relation: Son Secondary Emergency Contact: Alessandra Grout [place] of [place] Phone: [phone] Relation: Other  Code Status: full code  Goals of Care: Advanced Directive information Advanced Directives 06/01/2017  Does Patient Have a Medical Advance Directive? Yes  Type of Advance Directive [place]  Does patient want to make changes to medical advance directive? No - Patient declined  Copy of [place] in Chart? Yes  Would patient like information on creating a medical advance directive? -     Chief Complaint  Patient presents with  . Medical Management of Chronic Issues    Routine Visit     HPI: Patient is a 84 y.o. female seen today for routine visit. She denies any acute concern this visit. Per nursing, there are times when patient forgets to use her oxygen by nasal canula.   Depression- mild with PHQ9 of 6 on April quaterly assessment by MDS.   CHF- breathing stable. On oxygen and losartan along with lasix 3 days a week.   OAB- currently on  myrbetriq, denies incontinence this visit. Denies any urinary complaints  Insomnia- sleeping well, taking melatonin 3 mg qhs  gerd- denies any reflux symptom or trouble swallowing this visit, takes omeprazole 20 mg daily  afib- controlled HR on chart review, denies palpitation, taking diltiazem and eliquis  Constipation- mentions moving bowel every day, on chart review, there has been complaints about patient being constipated per pt report.    Past Medical History:  Diagnosis Date  . Abnormality of gait 12/30/2015  . Arthritis   . Asthma   . Atrial fibrillation ([place])   . DD (diverticular disease) 10/26/2013  . 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 Diverticulitis   . Edema 12/30/2015  . Gout   . Gout 12/30/2015  . History of CVA (cerebrovascular accident)    Left basal ganglia   . Hyperlipemia   . Hypertension   . Liposarcoma ([place]) 12/20/2015   excised 12/20/15  . Mitral valve regurgitation   . Mitral valve regurgitation 12/30/2015  . OP (osteoporosis) 10/26/2013  . PAF (paroxysmal atrial fibrillation) ([place]) 07/28/2013  . Pseudogout of knee 07/28/2013   03/16/13 Uric acid 5.5   . Spondylosis of cervical joint 12/30/2015  . Spondylosis of cervical spine   . TI (tricuspid incompetence) 05/18/2011   Overview:  Moderate severe   . Unspecified vitamin D deficiency 07/28/2013   03/16/13 Vit D 24.6   . Urinary incontinence without sensory awareness 07/28/2013  . Venous (peripheral) insufficiency 02/22/2014   Past Surgical History:  Procedure Laterality Date  . COLONOSCOPY  2011   Dr. Cindie Laroche Jule Ser)  . remove fatty tumor  2017  .  THUMB ARTHROSCOPY Right 2014   Dr. Burney Gauze  . TONSILLECTOMY  1938    reports that she quit smoking about 37 years ago. She has never used smokeless tobacco. She reports that she does not drink alcohol or use drugs. Social History   Socioeconomic History  . Marital status: Widowed    Spouse name: Not on file  . Number of children: Not on  file  . Years of education: Not on file  . Highest education level: Not on file  Occupational History  . Occupation: retired Doctor, hospital  . Financial resource strain: Not hard at all  . Food insecurity:    Worry: Never true    Inability: Never true  . Transportation needs:    Medical: No    Non-medical: No  Tobacco Use  . Smoking status: Former Smoker    Last attempt to quit: 07/28/1979    Years since quitting: 37.8  . Smokeless tobacco: Never Used  Substance and Sexual Activity  . Alcohol use: No    Comment: Has not drank in months.   . Drug use: No  . Sexual activity: Never  Lifestyle  . Physical activity:    Days per week: 2 days    Minutes per session: 30 min  . Stress: Only a little  Relationships  . Social connections:    Talks on phone: More than three times a week    Gets together: More than three times a week    Attends religious service: 1 to 4 times per year    Active member of club or organization: Yes    Attends meetings of clubs or organizations: 1 to 4 times per year    Relationship status: Widowed  . Intimate partner violence:    Fear of current or ex partner: No    Emotionally abused: No    Physically abused: No    Forced sexual activity: No  Other Topics Concern  . Not on file  Social History Narrative   [facility]   Widowed   Former smoker, stopped 1981   Alcohol one glass of wine couple nights a week   Exercise pool exercise 3 times a week   POA             Functional Status Survey:    Family History  Problem Relation Age of Onset  . Alzheimer's disease Mother   . Heart disease Father   . Heart disease Brother     Health Maintenance  Topic Date Due  . INFLUENZA VACCINE  09/09/2017  . TETANUS/TDAP  08/07/2023  . DEXA SCAN  Completed  . PNA vac Low Risk Adult  Completed    Allergies  Allergen Reactions  . Ace Inhibitors Cough    Cough  . Codeine Nausea And Vomiting  . Penicillins  Hives and Rash  . Sulfa Antibiotics Hives and Rash    Outpatient Encounter Medications as of 06/01/2017  Medication Sig  . acetaminophen (TYLENOL) 500 MG tablet Take 500 mg by mouth daily as needed.  Marland Kitchen albuterol (PROVENTIL HFA;VENTOLIN HFA) 108 (90 Base) MCG/ACT inhaler Inhale 2 puffs into the lungs every 6 (six) hours as needed for wheezing or shortness of breath.  Marland Kitchen apixaban (ELIQUIS) 2.5 MG TABS tablet Take 1 tablet (2.5 mg total) by mouth 2 (two) times daily.  Marland Kitchen diltiazem (CARDIZEM) 120 MG tablet Take 120 mg by mouth daily.  . furosemide (LASIX) 20 MG tablet On Mondays, Wednesdays, and  Fridays  . losartan (COZAAR) 100 MG tablet Take 100 mg by mouth daily.  . Melatonin 3 MG TABS Take 3 mg by mouth at bedtime.  . mirabegron ER (MYRBETRIQ) 25 MG TB24 tablet Take 25 mg by mouth daily.  . Misc Natural Products (BLACK CHERRY CONCENTRATE PO) Take 2 tablets by mouth daily.  . Multiple Vitamins-Minerals (CENTRUM SILVER ADULT 50+ PO) Take 1 tablet by mouth every morning.   . Omega 3 1200 MG CAPS Take 1 capsule by mouth daily.   Marland Kitchen omeprazole (PRILOSEC) 20 MG capsule Take 20 mg by mouth daily.  . OXYGEN Inhale 2 L into the lungs continuous.  . polyethylene glycol (MIRALAX / GLYCOLAX) packet Take 17 g by mouth daily.  . [DISCONTINUED] Melatonin 5 MG TABS Take 1 tablet by mouth at bedtime.   No facility-administered encounter medications on file as of 06/01/2017.     Review of Systems  Constitutional: Positive for fatigue. Negative for appetite change, chills, diaphoresis and fever.  HENT: Negative for congestion, ear pain, hearing loss, mouth sores, postnasal drip, rhinorrhea and trouble swallowing.   Eyes: Positive for visual disturbance.  Respiratory: Negative for cough, shortness of breath and wheezing.   Cardiovascular: Positive for leg swelling. Negative for chest pain and palpitations.  Gastrointestinal: Negative for abdominal pain, blood in stool, diarrhea, nausea and vomiting.       She  mentions moving her bowel every day  Genitourinary: Negative for dysuria and hematuria.  Musculoskeletal: Positive for gait problem. Negative for back pain.       Uses a walker, no fall reported  Skin: Negative for rash.  Neurological: Negative for dizziness, numbness and headaches.  Psychiatric/Behavioral: Positive for confusion. Negative for behavioral problems. The patient is not nervous/anxious.     Vitals:   06/01/17 1311  BP: 130/74  Pulse: 80  Resp: 18  Temp: 97.9 F (36.6 C)  TempSrc: Oral  SpO2: 92%  Weight: 133 lb (60.3 kg)  Height: 5' 2.5" (1.588 m)   Body mass index is 23.94 kg/m.   Wt Readings from Last 3 Encounters:  06/01/17 133 lb (60.3 kg)  05/12/17 132 lb 6.4 oz (60.1 kg)  05/10/17 134 lb (60.8 kg)   Physical Exam  Constitutional: She is oriented to person, place, and time. She appears well-developed and well-nourished. No distress.  HENT:  Head: Normocephalic and atraumatic.  Right Ear: External ear normal.  Left Ear: External ear normal.  Nose: Nose normal.  Mouth/Throat: Oropharynx is clear and moist.  Eyes: Pupils are equal, round, and reactive to light. Conjunctivae and EOM are normal. Right eye exhibits no discharge. Left eye exhibits no discharge.  Corrective glasses  Neck: Normal range of motion. Neck supple. No thyromegaly present.  Cardiovascular: Normal rate and regular rhythm.  Murmur heard. Pulmonary/Chest: Effort normal and breath sounds normal. She has no wheezes. She has no rales.  Abdominal: Soft. Bowel sounds are normal. There is no tenderness. There is no guarding.  Musculoskeletal: Normal range of motion. She exhibits edema. She exhibits no tenderness.  Able to move all 4 extremities, unsteady gait, uses walker for ambulation, arthritis changes to her fingers, 1+ edema  Lymphadenopathy:    She has no cervical adenopathy.  Neurological: She is alert and oriented to person, place, and time. She exhibits normal muscle tone.  04/23/17  MMSE 26/30  Skin: Skin is warm and dry. Capillary refill takes more than 3 seconds. She is not diaphoretic. No erythema.  Psychiatric: She has a normal mood  and affect. Her behavior is normal.    Labs reviewed: Basic Metabolic Panel: Recent Labs    01/04/17 0000 02/04/17 0655 03/11/17 1000 04/02/17 04/22/17  NA 140 140 142 140 141  K 4.0 4.1 3.8 4.5 3.9  CL 104 104 104  --   --   CO2 27 32 29  --   --   GLUCOSE 89 92 107*  --   --   BUN 19 22 15 20 8   CREATININE 0.86 0.76 0.73 0.7 0.6  CALCIUM 9.8 9.8 9.8  --   --    Liver Function Tests: Recent Labs    01/04/17 0000 02/04/17 0655 03/11/17 1000 04/02/17 04/22/17  AST 22 24 28 17 18   ALT 18 23 25 17 16   ALKPHOS  --   --  71 65 77  BILITOT 0.7 0.5 1.0  --   --   PROT 6.1 6.1 6.2*  --   --   ALBUMIN  --   --  3.5  --   --    No results for input(s): LIPASE, AMYLASE in the last 8760 hours. No results for input(s): AMMONIA in the last 8760 hours. CBC: Recent Labs    01/04/17 0000 02/04/17 0655 03/11/17 1000 04/02/17 04/22/17  WBC 5.4 5.0 4.5 5.3 5.8  HGB 14.1 13.2 13.6 13.3 14.8  HCT 41.5 37.6 41.4 38 43  MCV 94.7 91.3 98.6  --   --   PLT 234 246 204 217 326   Cardiac Enzymes: No results for input(s): CKTOTAL, CKMB, CKMBINDEX, TROPONINI in the last 8760 hours. BNP: Invalid input(s): POCBNP No results found for: HGBA1C Lab Results  Component Value Date   TSH 1.30 01/04/2017   No results found for: VITAMINB12 No results found for: FOLATE No results found for: IRON, TIBC, FERRITIN  Lipid Panel: Recent Labs    01/04/17 0000 02/04/17 0655  CHOL 161 150  HDL 69 72  LDLCALC 76 65  TRIG 79 54  CHOLHDL 2.3 2.1   No results found for: HGBA1C  Procedures since last visit: No results found.  Assessment/Plan  1. Essential hypertension Continue losartan, reviewed BP reading and BMP, stable  2. PAF (paroxysmal atrial fibrillation) (HCC) Controlled HR. Continue eliquis and diltiazem  3. CHF (congestive  heart failure), NYHA class I, chronic, diastolic (HCC) Appears euvolemic, c/w losartan daily and lasix 3 days a week, bmp and weight reviewed. Add ted hose  4. Slow transit constipation Continue miralax for now, encouraged hydration  5. Cognitive impairment Stable TSH, check b12. Reviewed CT head from 2015 showing mild atherosclerosis and patchy white matter hypodensity. Likely has a vascular component given her medical history. Start donepezil 5 mg daily for now and monitor  6. OAB (overactive bladder) Continue myrbetriq  7. Gastroesophageal reflux disease without esophagitis Continue PPI, no changes made  8. Mild depression ([place]) With cognitive impairment and change of environment. Supportive care for now. Starting donepezil as above.     Labs/tests ordered:  b12   Communication: reviewed care plan with patient and charge nurse.     Blanchie Serve, MD Internal Medicine [facility] [address] [place], [place] [address] Cell Phone (Monday-Friday 8 am - 5 pm): [phone] On Call: [phone] and follow prompts after 5 pm and on weekends Office Phone: [phone] Office Fax: [phone]

## 2017-06-03 LAB — VITAMIN B12: Vitamin B-12: 482

## 2017-06-04 ENCOUNTER — Other Ambulatory Visit: Payer: Self-pay | Admitting: *Deleted

## 2017-07-06 ENCOUNTER — Non-Acute Institutional Stay: Payer: Medicare Other | Admitting: Nurse Practitioner

## 2017-07-06 ENCOUNTER — Encounter: Payer: Self-pay | Admitting: Nurse Practitioner

## 2017-07-06 DIAGNOSIS — F5104 Psychophysiologic insomnia: Secondary | ICD-10-CM

## 2017-07-06 DIAGNOSIS — F418 Other specified anxiety disorders: Secondary | ICD-10-CM

## 2017-07-06 DIAGNOSIS — R4189 Other symptoms and signs involving cognitive functions and awareness: Secondary | ICD-10-CM

## 2017-07-06 NOTE — Progress Notes (Signed)
Location:  [place] Room Number: 161 Place of Service:  ALF [phone]) Provider:  Marlana Latus  NP  Mast, Man X, NP  Patient Care Team: Mast, Man X, NP as PCP - General (Internal Medicine) Nahser, Wonda Cheng, MD as PCP - Cardiology (Cardiology) Mast, Man X, NP as Nurse Practitioner (Nurse Practitioner) Bo Merino, MD as Consulting Physician (Rheumatology) Curly Rim, MD as Consulting Physician (Internal Medicine) Ernst Spell, MD as Referring Physician (Urology) Charlotte Crumb, MD as Consulting Physician (Orthopedic Surgery) Festus Aloe, MD as Consulting Physician (Urology)  Extended Emergency Contact Information Primary Emergency Contact: [facility] Address: [address]          [place], [place] [address] [name] of [place] Phone: [phone] Mobile Phone: [phone] Relation: Son Secondary Emergency Contact: Alessandra Grout [place] of [place] Phone: [phone] Relation: Other  Code Status: Full Code Goals of care: Advanced Directive information Advanced Directives 07/06/2017  Does Patient Have a Medical Advance Directive? Yes  Type of Advance Directive [place]  Does patient want to make changes to medical advance directive? No - Patient declined  Copy of [place] in Chart? Yes  Would patient like information on creating a medical advance directive? No - Patient declined     Chief Complaint  Patient presents with  . Acute Visit    Patient having trouble sleeping    HPI:  Pt is a 84 y.o. female seen today for an acute visit for Worsened and persisted emotional outbursts: angry, difficulty falling asleep at night,  fr[place], not able to attend outside church, hair salon, nail salon in additional to her cognitive impairment. PHQ9 scored 6 05/13/17.  The patient has been repetitive, trouble comprehending and memory  recalling. Donepezil started 06/06/17, tolerated well.    Past Medical History:  Diagnosis Date  . Abnormality of gait 12/30/2015  . Arthritis   . Asthma   . Atrial fibrillation ([place])   . DD (diverticular disease) 10/26/2013  . 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 Diverticulitis   . Edema 12/30/2015  . Gout   . Gout 12/30/2015  . History of CVA (cerebrovascular accident)    Left basal ganglia   . Hyperlipemia   . Hypertension   . Liposarcoma ([place]) 12/20/2015   excised 12/20/15  . Mitral valve regurgitation   . Mitral valve regurgitation 12/30/2015  . OP (osteoporosis) 10/26/2013  . PAF (paroxysmal atrial fibrillation) ([place]) 07/28/2013  . Pseudogout of knee 07/28/2013   03/16/13 Uric acid 5.5   . Spondylosis of cervical joint 12/30/2015  . Spondylosis of cervical spine   . TI (tricuspid incompetence) 05/18/2011   Overview:  Moderate severe   . Unspecified vitamin D deficiency 07/28/2013   03/16/13 Vit D 24.6   . Urinary incontinence without sensory awareness 07/28/2013  . Venous (peripheral) insufficiency 02/22/2014   Past Surgical History:  Procedure Laterality Date  . COLONOSCOPY  2011   Dr. Cindie Laroche Jule Ser)  . remove fatty tumor  2017  . THUMB ARTHROSCOPY Right 2014   Dr. Burney Gauze  . TONSILLECTOMY  1938    Allergies  Allergen Reactions  . Ace Inhibitors Cough    Cough  . Codeine Nausea And Vomiting  . Penicillins Hives and Rash  . Sulfa Antibiotics Hives and Rash    Outpatient Encounter Medications as of 07/06/2017  Medication Sig  . acetaminophen (TYLENOL) 500 MG tablet Take 500 mg by mouth daily  as needed.  Marland Kitchen albuterol (PROVENTIL HFA;VENTOLIN HFA) 108 (90 Base) MCG/ACT inhaler Inhale 2 puffs into the lungs every 6 (six) hours as needed for wheezing or shortness of breath.  Marland Kitchen apixaban (ELIQUIS) 2.5 MG TABS tablet Take 1 tablet (2.5 mg total) by mouth 2 (two) times daily.  Marland Kitchen diltiazem (CARDIZEM) 120 MG tablet Take 120 mg by mouth daily.  Marland Kitchen donepezil  (ARICEPT) 5 MG tablet Take 5 mg by mouth at bedtime.  . furosemide (LASIX) 20 MG tablet On Mondays, Wednesdays, and Fridays  . losartan (COZAAR) 100 MG tablet Take 100 mg by mouth daily.  . Melatonin 3 MG TABS Take 3 mg by mouth at bedtime.  . mirabegron ER (MYRBETRIQ) 25 MG TB24 tablet Take 25 mg by mouth daily.  . Misc Natural Products (BLACK CHERRY CONCENTRATE PO) Take 2 tablets by mouth daily.  . Multiple Vitamins-Minerals (CENTRUM SILVER ADULT 50+ PO) Take 1 tablet by mouth every morning.   Marland Kitchen omeprazole (PRILOSEC) 20 MG capsule Take 20 mg by mouth daily.  . OXYGEN Inhale 2 L into the lungs continuous.  . polyethylene glycol (MIRALAX / GLYCOLAX) packet Take 17 g by mouth daily.  . [DISCONTINUED] Omega 3 1200 MG CAPS Take 1 capsule by mouth daily.    No facility-administered encounter medications on file as of 07/06/2017.     Review of Systems  Constitutional: Positive for fatigue. Negative for activity change, appetite change, chills, diaphoresis and fever.  HENT: Positive for hearing loss. Negative for congestion, trouble swallowing and voice change.   Respiratory: Negative for cough and shortness of breath.   Cardiovascular: Positive for leg swelling. Negative for chest pain.  Musculoskeletal: Positive for gait problem.  Skin: Negative for color change and pallor.  Neurological: Negative for dizziness, facial asymmetry, speech difficulty, weakness, numbness and headaches.       Memory lapses  Psychiatric/Behavioral: Positive for behavioral problems, confusion and sleep disturbance. Negative for hallucinations and suicidal ideas. The patient is nervous/anxious.     Immunization History  Administered Date(s) Administered  . Influenza Split 11/07/2009  . Influenza, High Dose Seasonal PF 01/10/2016, 11/18/2016  . Influenza,inj,quad, With Preservative 01/10/2016  . Influenza-Unspecified 11/09/2012, 11/27/2013, 11/08/2014, 11/21/2015  . Pneumococcal Conjugate-13 01/10/2016  .  Pneumococcal Polysaccharide-23 02/09/2002  . Td 08/10/2003  . Tdap 08/06/2013  . Zoster 03/12/2005   Pertinent  Health Maintenance Due  Topic Date Due  . INFLUENZA VACCINE  09/09/2017  . DEXA SCAN  Completed  . PNA vac Low Risk Adult  Completed   Fall Risk  01/07/2017 11/18/2015 06/06/2015 06/06/2015 05/10/2014  Falls in the past year? No No No No No  Number falls in past yr: - - - - -   Functional Status Survey:    Vitals:   07/06/17 1057  BP: 136/80  Pulse: 78  Resp: 20  Temp: 98 F (36.7 C)  SpO2: 92%  Weight: 136 lb 9.6 oz (62 kg)  Height: 5' 2.5" (1.588 m)   Body mass index is 24.59 kg/m. Physical Exam  Constitutional: She appears well-developed and well-nourished.  HENT:  Head: Normocephalic and atraumatic.  Eyes: Pupils are equal, round, and reactive to light. EOM are normal.  Neck: Normal range of motion. Neck supple. No JVD present. No thyromegaly present.  Cardiovascular: Normal rate and regular rhythm.  Pulmonary/Chest: She has no wheezes. She has no rales.  Musculoskeletal: She exhibits edema.  Trace edema BLE, ambulates with walker.   Neurological: She is alert. No cranial nerve deficit. She exhibits  normal muscle tone. Coordination normal.  Oriented to person and place.   Skin: Skin is warm and dry.  Psychiatric:  Appears anxious and repetitive    Labs reviewed: Recent Labs    01/04/17 0000 02/04/17 0655 03/11/17 1000 04/02/17 04/22/17 04/26/17  NA 140 140 142 140 141  --   K 4.0 4.1 3.8 4.5 3.9  --   CL 104 104 104  --   --  100  CO2 27 32 29  --   --  35  GLUCOSE 89 92 107*  --   --   --   BUN 19 22 15 20 8   --   CREATININE 0.86 0.76 0.73 0.7 0.6  --   CALCIUM 9.8 9.8 9.8  --   --  10.2   Recent Labs    01/04/17 0000 02/04/17 0655 03/11/17 1000 04/02/17 04/22/17 04/26/17  AST 22 24 28 17 18   --   ALT 18 23 25 17 16   --   ALKPHOS  --   --  71 65 77  --   BILITOT 0.7 0.5 1.0  --   --   --   PROT 6.1 6.1 6.2*  --   --  6.2  ALBUMIN  --    --  3.5  --   --  3.8   Recent Labs    01/04/17 0000 02/04/17 0655 03/11/17 1000 04/02/17 04/22/17  WBC 5.4 5.0 4.5 5.3 5.8  HGB 14.1 13.2 13.6 13.3 14.8  HCT 41.5 37.6 41.4 38 43  MCV 94.7 91.3 98.6  --   --   PLT 234 246 204 217 326   Lab Results  Component Value Date   TSH 1.30 01/04/2017   No results found for: HGBA1C Lab Results  Component Value Date   CHOL 150 02/04/2017   HDL 72 02/04/2017   LDLCALC 65 02/04/2017   TRIG 54 02/04/2017   CHOLHDL 2.1 02/04/2017    Significant Diagnostic Results in last 30 days:  No results found.  Assessment/Plan Depression with anxiety Worsened and persisted emotional outbursts: angry, difficulty falling aslleep at night,  fr[place] able to attend outside church, hair salon, nail salon in additional to her cognitive impairment. PHQ9 scored 6 05/13/17. Will try Mirtazapine 7.5mg  qhs to aid to sleep and stabilize mood. Observe.   Chronic insomnia Chronic, persisted issue, will try Mirtazapine 7.5mg  qhs, observe.   Cognitive impairment Tolerated Donepezil 5mg  qd started 06/06/17, Vit B12 482 05/2017, continue [place] [facility] for care assistance. Observe.     Family/ staff Communication: plan of care reviewed with the patient and charge nurse.   Labs/tests ordered: none  Time spend 25 minutes.

## 2017-07-06 NOTE — Assessment & Plan Note (Signed)
Tolerated Donepezil 5mg  qd started 06/06/17, Vit B12 482 05/2017, continue [place] [facility] for care assistance. Observe.

## 2017-07-06 NOTE — Assessment & Plan Note (Addendum)
Worsened and persisted emotional outbursts: angry, difficulty falling aslleep at night,  fr[place] able to [place] outside church, hair salon, nail salon in additional to her cognitive impairment. PHQ9 scored 6 05/13/17. Will try Mirtazapine 7.5mg  qhs to aid to sleep and stabilize mood. Observe.

## 2017-07-06 NOTE — Assessment & Plan Note (Signed)
Chronic, persisted issue, will try Mirtazapine 7.5mg  qhs, observe.

## 2017-07-15 ENCOUNTER — Encounter: Payer: Medicare Other | Admitting: Nurse Practitioner

## 2017-08-10 ENCOUNTER — Ambulatory Visit (INDEPENDENT_AMBULATORY_CARE_PROVIDER_SITE_OTHER): Payer: Medicare Other | Admitting: Cardiovascular Disease

## 2017-08-10 ENCOUNTER — Encounter: Payer: Self-pay | Admitting: Cardiovascular Disease

## 2017-08-10 VITALS — BP 124/52 | HR 71 | Ht 62.0 in | Wt 134.8 lb

## 2017-08-10 DIAGNOSIS — I10 Essential (primary) hypertension: Secondary | ICD-10-CM | POA: Diagnosis not present

## 2017-08-10 DIAGNOSIS — I272 Pulmonary hypertension, unspecified: Secondary | ICD-10-CM

## 2017-08-10 DIAGNOSIS — I5032 Chronic diastolic (congestive) heart failure: Secondary | ICD-10-CM

## 2017-08-10 DIAGNOSIS — I480 Paroxysmal atrial fibrillation: Secondary | ICD-10-CM | POA: Diagnosis not present

## 2017-08-10 NOTE — Patient Instructions (Signed)
Medication Instructions:  Your physician recommends that you continue on your current medications as directed. Please refer to the Current Medication list given to you today.   Labwork: None Ordered   Testing/Procedures: None Ordered   Follow-Up: Your physician wants you to follow-up in: 6 months with Pecolia Ades, NP or another member of Dr. Elmarie Shiley team. Dennis Bast will receive a reminder letter in the mail two months in advance. If you don't receive a letter, please call our office to schedule the follow-up appointment.   If you need a refill on your cardiac medications before your next appointment, please call your pharmacy.   Thank you for choosing [facility]! Christen Bame, RN [phone]

## 2017-08-10 NOTE — Progress Notes (Signed)
Cardiology Office Note:    Date:  08/10/2017   ID:  Diana Petersen, DOB [birth year], MRN [id number]  PCP:  Mast, Man X, NP  Cardiologist:  Mertie Moores, MD    Referring MD: Mast, Man X, NP   Chief Complaint  Patient presents with  . Atrial Fibrillation    Problem list 1. Paroxysmal Atrial fibrillation 2. Pulmonary hypertension 3.  Essential HTN       Diana Petersen is a 84 y.o. female with a hx of  Paroxysmal atrial fib  She is previously seen by Dr. Clovia Cuff at  [facility].   Has pulmonary HTN mentioned in her notes.  Has some DOE   Lives at [facility] ( independent)  Still does all of her normal activities.   Still drives short distances ( limited by her old car)   May 12, 2017:  Diana Petersen is seen today for follow up visit .  Seen with son, Nicole Kindred.  She has a history of paroxysmal atrial fibrillation.  She has a history of chronic diastolic dysfunction  ( grade 1 diastolic dysfunction) and is on Lasix.  She has had lots of problems with constipation.  Has occasional episodes of DOE,    No significant leg swelling  Was in independent living ,   Now is in assisted living     August 10, 2017:     Diana Petersen is seen back today for follow-up of her chronic diastolic congestive heart failure and paroxysmal atrial fibrillation. Lives at [facility] - assisted living    Past Medical History:  Diagnosis Date  . Abnormality of gait 12/30/2015  . Arthritis   . Asthma   . Atrial fibrillation ([place])   . DD (diverticular disease) 10/26/2013  . 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 Diverticulitis   . Edema 12/30/2015  . Gout   . Gout 12/30/2015  . History of CVA (cerebrovascular accident)    Left basal ganglia   . Hyperlipemia   . Hypertension   . Liposarcoma ([place]) 12/20/2015   excised 12/20/15  . Mitral valve regurgitation   . Mitral valve regurgitation 12/30/2015  . OP (osteoporosis) 10/26/2013  . PAF (paroxysmal atrial fibrillation) ([place]) 07/28/2013  .  Pseudogout of knee 07/28/2013   03/16/13 Uric acid 5.5   . Spondylosis of cervical joint 12/30/2015  . Spondylosis of cervical spine   . TI (tricuspid incompetence) 05/18/2011   Overview:  Moderate severe   . Unspecified vitamin D deficiency 07/28/2013   03/16/13 Vit D 24.6   . Urinary incontinence without sensory awareness 07/28/2013  . Venous (peripheral) insufficiency 02/22/2014    Past Surgical History:  Procedure Laterality Date  . COLONOSCOPY  2011   Dr. Cindie Laroche Jule Ser)  . remove fatty tumor  2017  . THUMB ARTHROSCOPY Right 2014   Dr. Burney Gauze  . TONSILLECTOMY  1938    Current Medications: Current Meds  Medication Sig  . acetaminophen (TYLENOL) 500 MG tablet Take 500 mg by mouth daily as needed.  Marland Kitchen albuterol (PROVENTIL HFA;VENTOLIN HFA) 108 (90 Base) MCG/ACT inhaler Inhale 2 puffs into the lungs every 6 (six) hours as needed for wheezing or shortness of breath.  Marland Kitchen apixaban (ELIQUIS) 2.5 MG TABS tablet Take 1 tablet (2.5 mg total) by mouth 2 (two) times daily.  Marland Kitchen diltiazem (CARDIZEM) 120 MG tablet Take 120 mg by mouth daily.  Marland Kitchen donepezil (ARICEPT) 5 MG tablet Take 5 mg by mouth at bedtime.  . furosemide (LASIX) 20 MG tablet Take 20 mg by mouth.  ON Monday, Wednesday AND FRIDAY  . losartan (COZAAR) 100 MG tablet Take 100 mg by mouth daily.  . Melatonin 3 MG TABS Take 3 mg by mouth at bedtime.  . mirabegron ER (MYRBETRIQ) 25 MG TB24 tablet Take 25 mg by mouth daily.  . mirtazapine (REMERON) 15 MG tablet Take 15 mg by mouth at bedtime.  . Misc Natural Products (BLACK CHERRY CONCENTRATE PO) Take 2 tablets by mouth daily.  . Multiple Vitamins-Minerals (CENTRUM SILVER ADULT 50+ PO) Take 1 tablet by mouth every morning.   Marland Kitchen omeprazole (PRILOSEC) 20 MG capsule Take 20 mg by mouth daily.  . OXYGEN Inhale 2 L into the lungs continuous.  . polyethylene glycol (MIRALAX / GLYCOLAX) packet Take 17 g by mouth daily.     Allergies:   Ace inhibitors; Codeine; Penicillins; and Sulfa  antibiotics   Social History   Socioeconomic History  . Marital status: Widowed    Spouse name: Not on file  . Number of children: Not on file  . Years of education: Not on file  . Highest education level: Not on file  Occupational History  . Occupation: retired Doctor, hospital  . Financial resource strain: Not hard at all  . Food insecurity:    Worry: Never true    Inability: Never true  . Transportation needs:    Medical: No    Non-medical: No  Tobacco Use  . Smoking status: Former Smoker    Last attempt to quit: 07/28/1979    Years since quitting: 38.0  . Smokeless tobacco: Never Used  Substance and Sexual Activity  . Alcohol use: No    Comment: Has not drank in months.   . Drug use: No  . Sexual activity: Never  Lifestyle  . Physical activity:    Days per week: 2 days    Minutes per session: 30 min  . Stress: Only a little  Relationships  . Social connections:    Talks on phone: More than three times a week    Gets together: More than three times a week    Attends religious service: 1 to 4 times per year    Active member of club or organization: Yes    Attends meetings of clubs or organizations: 1 to 4 times per year    Relationship status: Widowed  Other Topics Concern  . Not on file  Social History Narrative   Lives at [facility] since 2013   Widowed   Former smoker, stopped 1981   Alcohol one glass of wine couple nights a week   Exercise pool exercise 3 times a week   POA              Family History: The patient's family history includes Alzheimer's disease in her mother; Heart disease in her brother and father. ROS:   Please see the history of present illness.     All other systems reviewed and are negative.   Physical Exam: Blood pressure (!) 124/52, pulse 71, height 5\' 2"  (1.575 m), weight 134 lb 12.8 oz (61.1 kg), SpO2 92 %.  GEN:  Well nourished, well developed in no acute distress HEENT: Normal NECK: No JVD; No  carotid bruits LYMPHATICS: No lymphadenopathy CARDIAC: RR, no murmurs, rubs, gallops RESPIRATORY:  Clear to auscultation without rales, wheezing or rhonchi  ABDOMEN: Soft, non-tender, non-distended MUSCULOSKELETAL:  No edema; No deformity  SKIN: Warm and dry NEUROLOGIC:  Alert and oriented x 3    EKGs/Labs/Other Studies Reviewed:  The following studies were reviewed today:   EKG:       Recent Labs: 01/04/2017: TSH 1.30 04/22/2017: ALT 16; BUN 8; Creatinine 0.6; Hemoglobin 14.8; Platelets 326; Potassium 3.9; Sodium 141  Recent Lipid Panel    Component Value Date/Time   CHOL 150 02/04/2017 0655   CHOL 196 05/28/2015   TRIG 54 02/04/2017 0655   TRIG 75 05/28/2015   HDL 72 02/04/2017 0655   CHOLHDL 2.1 02/04/2017 0655   VLDL 15 05/28/2015   LDLCALC 65 02/04/2017 0655   LDLCALC 119 05/28/2015      ASSESSMENT:     PLAN:       1. Paroxysmal atrial fibrillation:     2.  Chronic diastolic congestive heart failure.  She has been on low-dose Lasix.  She still has some mild shortness breath with exertion but her main complaint now is constipation which is led to nausea and poor appetite.  It is possible that that the Lasix may be contributing to this.  We will decrease the Lasix to 20 mg on Mondays, Wednesdays, Fridays and see how she does.  2.  Mild pulmonary hypertension: She will continue to watch her salt.  We will continue Lasix at the lower dose as noted above.  Medication Adjustments/Labs and Tests Ordered: Current medicines are reviewed at length with the patient today.  Concerns regarding medicines are outlined above.  No orders of the defined types were placed in this encounter.  No orders of the defined types were placed in this encounter.   Signed, Mertie Moores, MD  08/10/2017 11:10 AM    [place]

## 2017-09-08 ENCOUNTER — Non-Acute Institutional Stay: Payer: Medicare Other | Admitting: Internal Medicine

## 2017-09-08 ENCOUNTER — Encounter: Payer: Self-pay | Admitting: Internal Medicine

## 2017-09-08 DIAGNOSIS — I5032 Chronic diastolic (congestive) heart failure: Secondary | ICD-10-CM | POA: Diagnosis not present

## 2017-09-08 DIAGNOSIS — F03918 Unspecified dementia, unspecified severity, with other behavioral disturbance: Secondary | ICD-10-CM | POA: Insufficient documentation

## 2017-09-08 DIAGNOSIS — I10 Essential (primary) hypertension: Secondary | ICD-10-CM | POA: Diagnosis not present

## 2017-09-08 DIAGNOSIS — I480 Paroxysmal atrial fibrillation: Secondary | ICD-10-CM

## 2017-09-08 DIAGNOSIS — F0391 Unspecified dementia with behavioral disturbance: Secondary | ICD-10-CM

## 2017-09-08 DIAGNOSIS — K219 Gastro-esophageal reflux disease without esophagitis: Secondary | ICD-10-CM | POA: Diagnosis not present

## 2017-09-08 DIAGNOSIS — N3281 Overactive bladder: Secondary | ICD-10-CM

## 2017-09-08 NOTE — Progress Notes (Signed)
Location:  [place] Room Number: 694 Place of Service:  ALF [phone]) Provider:  Blanchie Serve MD  Mast, Man X, NP  Patient Care Team: Mast, Man X, NP as PCP - General (Internal Medicine) Nahser, Wonda Cheng, MD as PCP - Cardiology (Cardiology) Mast, Man X, NP as Nurse Practitioner (Nurse Practitioner) Bo Merino, MD as Consulting Physician (Rheumatology) Curly Rim, MD as Consulting Physician (Internal Medicine) Ernst Spell, MD as Referring Physician (Urology) Charlotte Crumb, MD as Consulting Physician (Orthopedic Surgery) Festus Aloe, MD as Consulting Physician (Urology)  Extended Emergency Contact Information Primary Emergency Contact: [facility] Address: [address]          [place], [place] [address] [name] of [place] Phone: [phone] Mobile Phone: [phone] Relation: Son Secondary Emergency Contact: Alessandra Grout [place] of [place] Phone: [phone] Relation: Other   Goals of care: Advanced Directive information Advanced Directives 09/08/2017  Does Patient Have a Medical Advance Directive? Yes  Type of Advance Directive [place]  Does patient want to make changes to medical advance directive? No - Patient declined  Copy of [place] in Chart? Yes  Would patient like information on creating a medical advance directive? -     Chief Complaint  Patient presents with  . Medical Management of Chronic Issues    Routine Visit     HPI:  Pt is a 84 y.o. female seen today for medical management of chronic diseases. She is seen in her room with her charge nurse present. She is upset with the meal provided in dining area. She would like more fresh fruits and vegetables in her meals. She denies any other concern.   Chronic diastolic CHF- currently on losartan and diltiazem with lasix 3 days a week. Bp reading stable on review.  Overactive bladder- has  urinary frequency, takes mirabegron 25 mg daily  gerd- on omeprazole 20 mg daily  Hypertension- controlled BP readings. Bp medication reviewed.   afib- takes diltiazem 120 mg daily for rate control, currently on apixaban 2.5 mg bid for stroke prevention  Dementia- on aricept, on low dose remeron and melatonin, tolerating well   Past Medical History:  Diagnosis Date  . Abnormality of gait 12/30/2015  . Arthritis   . Asthma   . Atrial fibrillation ([place])   . DD (diverticular disease) 10/26/2013  . 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 Diverticulitis   . Edema 12/30/2015  . Gout   . Gout 12/30/2015  . History of CVA (cerebrovascular accident)    Left basal ganglia   . Hyperlipemia   . Hypertension   . Liposarcoma ([place]) 12/20/2015   excised 12/20/15  . Mitral valve regurgitation   . Mitral valve regurgitation 12/30/2015  . OP (osteoporosis) 10/26/2013  . PAF (paroxysmal atrial fibrillation) ([place]) 07/28/2013  . Pseudogout of knee 07/28/2013   03/16/13 Uric acid 5.5   . Spondylosis of cervical joint 12/30/2015  . Spondylosis of cervical spine   . TI (tricuspid incompetence) 05/18/2011   Overview:  Moderate severe   . Unspecified vitamin D deficiency 07/28/2013   03/16/13 Vit D 24.6   . Urinary incontinence without sensory awareness 07/28/2013  . Venous (peripheral) insufficiency 02/22/2014   Past Surgical History:  Procedure Laterality Date  . COLONOSCOPY  2011   Dr. Cindie Laroche Jule Ser)  . remove fatty tumor  2017  . THUMB ARTHROSCOPY Right 2014   Dr. Burney Gauze  . TONSILLECTOMY  1938    Allergies  Allergen Reactions  . Ace Inhibitors Cough    Cough  . Codeine Nausea And Vomiting  . Penicillins Hives and Rash  . Sulfa Antibiotics Hives and Rash    Outpatient Encounter Medications as of 09/08/2017  Medication Sig  . acetaminophen (TYLENOL) 500 MG tablet Take 500 mg by mouth daily as needed.  Marland Kitchen albuterol (PROVENTIL HFA;VENTOLIN HFA) 108 (90 Base) MCG/ACT inhaler  Inhale 2 puffs into the lungs every 6 (six) hours as needed for wheezing or shortness of breath.  Marland Kitchen apixaban (ELIQUIS) 2.5 MG TABS tablet Take 1 tablet (2.5 mg total) by mouth 2 (two) times daily.  Marland Kitchen diltiazem (CARDIZEM) 120 MG tablet Take 120 mg by mouth daily.  Marland Kitchen donepezil (ARICEPT) 10 MG tablet Take 10 mg by mouth at bedtime.  . furosemide (LASIX) 20 MG tablet Take 20 mg by mouth. ON Monday, Wednesday AND FRIDAY  . losartan (COZAAR) 100 MG tablet Take 100 mg by mouth daily.  . Melatonin 3 MG TABS Take 3 mg by mouth at bedtime.  . mirabegron ER (MYRBETRIQ) 25 MG TB24 tablet Take 25 mg by mouth daily.  . mirtazapine (REMERON) 7.5 MG tablet Take 7.5 mg by mouth at bedtime.  . Misc Natural Products (BLACK CHERRY CONCENTRATE PO) Take 2 tablets by mouth daily.  . Multiple Vitamins-Minerals (CENTRUM SILVER ADULT 50+ PO) Take 1 tablet by mouth every morning.   . Omega-3 Fatty Acids (FISH OIL PO) Take 1 capsule by mouth daily.  Marland Kitchen omeprazole (PRILOSEC) 20 MG capsule Take 20 mg by mouth daily.  . OXYGEN Inhale 2 L into the lungs continuous.  . polyethylene glycol (MIRALAX / GLYCOLAX) packet Take 17 g by mouth daily.  . [DISCONTINUED] donepezil (ARICEPT) 5 MG tablet Take 5 mg by mouth at bedtime.  . [DISCONTINUED] mirtazapine (REMERON) 15 MG tablet Take 15 mg by mouth at bedtime.   No facility-administered encounter medications on file as of 09/08/2017.     Review of Systems  Constitutional: Negative for appetite change, chills and fever.  HENT: Negative for congestion, ear pain, rhinorrhea, sore throat and trouble swallowing.   Eyes: Negative for pain and itching.  Respiratory: Negative for cough, shortness of breath and wheezing.   Cardiovascular: Negative for chest pain and palpitations.  Gastrointestinal: Negative for abdominal pain, constipation, diarrhea, nausea and vomiting.  Genitourinary: Positive for frequency. Negative for dysuria.  Musculoskeletal: Positive for arthralgias and gait  problem.  Neurological: Negative for dizziness and headaches.  Hematological: Bruises/bleeds easily.  Psychiatric/Behavioral: Positive for behavioral problems, confusion and sleep disturbance.    Immunization History  Administered Date(s) Administered  . Influenza Split 11/07/2009  . Influenza, High Dose Seasonal PF 01/10/2016, 11/18/2016  . Influenza,inj,quad, With Preservative 01/10/2016  . Influenza-Unspecified 11/09/2012, 11/27/2013, 11/08/2014, 11/21/2015  . Pneumococcal Conjugate-13 01/10/2016  . Pneumococcal Polysaccharide-23 02/09/2002  . Td 08/10/2003  . Tdap 08/06/2013  . Zoster 03/12/2005   Pertinent  Health Maintenance Due  Topic Date Due  . INFLUENZA VACCINE  09/09/2017  . DEXA SCAN  Completed  . PNA vac Low Risk Adult  Completed   Fall Risk  01/07/2017 11/18/2015 06/06/2015 06/06/2015 05/10/2014  Falls in the past year? No No No No No  Number falls in past yr: - - - - -   Functional Status Survey:    Vitals:   09/08/17 1435  BP: 134/80  Pulse: 76  Resp: 20  Temp: 97.6 F (36.4 C)  TempSrc: Oral  SpO2: 93%  Weight: 134 lb 3.2 oz (60.9 kg)  Height: 5' 2.5" (1.588 m)   Body mass index is 24.15 kg/m.   Wt Readings from Last 3 Encounters:  09/08/17 134 lb 3.2 oz (60.9 kg)  08/10/17 134 lb 12.8 oz (61.1 kg)  07/06/17 136 lb 9.6 oz (62 kg)   Physical Exam  Constitutional: She appears well-developed and well-nourished. No distress.  HENT:  Head: Normocephalic and atraumatic.  Nose: Nose normal.  Mouth/Throat: Oropharynx is clear and moist.  Eyes: Pupils are equal, round, and reactive to light. Conjunctivae and EOM are normal. Right eye exhibits no discharge. Left eye exhibits no discharge.  Neck: Normal range of motion. Neck supple.  Cardiovascular: Normal rate and regular rhythm.  Pulmonary/Chest: Effort normal and breath sounds normal. She has no wheezes. She has no rales.  Abdominal: Soft. Bowel sounds are normal. There is no tenderness. There is no  guarding.  Musculoskeletal: She exhibits edema.  Can move all 4 extremities, unsteady gait and trace leg edema, uses walker for ambulation  Lymphadenopathy:    She has no cervical adenopathy.  Neurological: She is alert.  Oriented to person and place  Skin: Skin is warm and dry. She is not diaphoretic.    Labs reviewed: Recent Labs    01/04/17 0000 02/04/17 0655 03/11/17 1000 04/02/17 04/22/17 04/26/17  NA 140 140 142 140 141  --   K 4.0 4.1 3.8 4.5 3.9  --   CL 104 104 104  --   --  100  CO2 27 32 29  --   --  35  GLUCOSE 89 92 107*  --   --   --   BUN 19 22 15 20 8   --   CREATININE 0.86 0.76 0.73 0.7 0.6  --   CALCIUM 9.8 9.8 9.8  --   --  10.2   Recent Labs    01/04/17 0000 02/04/17 0655 03/11/17 1000 04/02/17 04/22/17 04/26/17  AST 22 24 28 17 18   --   ALT 18 23 25 17 16   --   ALKPHOS  --   --  71 65 77  --   BILITOT 0.7 0.5 1.0  --   --   --   PROT 6.1 6.1 6.2*  --   --  6.2  ALBUMIN  --   --  3.5  --   --  3.8   Recent Labs    01/04/17 0000 02/04/17 0655 03/11/17 1000 04/02/17 04/22/17  WBC 5.4 5.0 4.5 5.3 5.8  HGB 14.1 13.2 13.6 13.3 14.8  HCT 41.5 37.6 41.4 38 43  MCV 94.7 91.3 98.6  --   --   PLT 234 246 204 217 326   Lab Results  Component Value Date   TSH 1.30 01/04/2017   No results found for: HGBA1C Lab Results  Component Value Date   CHOL 150 02/04/2017   HDL 72 02/04/2017   LDLCALC 65 02/04/2017   TRIG 54 02/04/2017   CHOLHDL 2.1 02/04/2017    Significant Diagnostic Results in last 30 days:  No results found.  Assessment/Plan  1. PAF (paroxysmal atrial fibrillation) (HCC) Controlled HR. Continue diltiazem. Continue apixaban. Check cbc  2. Essential hypertension controlled BP, continue current regimen of losartan and diltiazem with lasix 3 days a week. Check bmp  3. CHF (congestive heart failure), NYHA class I, chronic, diastolic (HCC) Check cmp, continue lasix, losartan and diltiazem, stable  4. Gastroesophageal reflux disease  without esophagitis Continue PPI  5. OAB (overactive bladder) Continue mirabegron and perineal care  6. Dementia with behavioral disturbance, unspecified dementia type Continue donepezil and remeron, continue melatonin, check cbc and TSH. Redirection to help with her anxiety.     Family/ staff Communication: reviewed care plan with patient and charge nurse.    Labs/tests ordered:  Cbc, cmp, TSH next lab   [facility], MD Internal Medicine [facility] [place], [place] [address] Cell Phone (Monday-Friday 8 am - 5 pm): [phone] On Call: [phone] and follow prompts after 5 pm and on weekends Office Phone: [phone] Office Fax: [phone]

## 2017-09-09 LAB — CBC AND DIFFERENTIAL
HCT: 42 (ref 36–46)
HEMOGLOBIN: 14.5 (ref 12.0–16.0)
PLATELETS: 215 (ref 150–399)
WBC: 4.6

## 2017-09-09 LAB — BASIC METABOLIC PANEL
BUN: 21 (ref 4–21)
CREATININE: 0.8 (ref 0.5–1.1)
GLUCOSE: 82
POTASSIUM: 4.4 (ref 3.4–5.3)
Sodium: 140 (ref 137–147)

## 2017-09-09 LAB — HEPATIC FUNCTION PANEL
ALT: 22 (ref 7–35)
AST: 24 (ref 13–35)
Alkaline Phosphatase: 67 (ref 25–125)
Bilirubin, Total: 0.5

## 2017-09-09 LAB — TSH: TSH: 0.99 (ref <=5.90)

## 2017-09-10 ENCOUNTER — Other Ambulatory Visit: Payer: Self-pay | Admitting: *Deleted

## 2017-09-10 LAB — COMPLETE METABOLIC PANEL WITH GFR
Albumin: 4
CHLORIDE: 103
Calcium: 10
Carbon Dioxide, Total: 33
EGFR (Non-African Amer.): 72
Globulin: 2.2
TOTAL PROTEIN: 6.2 g/dL

## 2017-10-15 ENCOUNTER — Non-Acute Institutional Stay: Payer: Medicare Other | Admitting: Nurse Practitioner

## 2017-10-15 ENCOUNTER — Encounter: Payer: Self-pay | Admitting: Nurse Practitioner

## 2017-10-15 DIAGNOSIS — I872 Venous insufficiency (chronic) (peripheral): Secondary | ICD-10-CM

## 2017-10-15 DIAGNOSIS — F0391 Unspecified dementia with behavioral disturbance: Secondary | ICD-10-CM

## 2017-10-15 DIAGNOSIS — L989 Disorder of the skin and subcutaneous tissue, unspecified: Secondary | ICD-10-CM | POA: Diagnosis not present

## 2017-10-15 NOTE — Progress Notes (Signed)
Location:   [facility] Room Number: 413 Place of Service: [facility] Provider:  Lennie Odor Mast NP  Mast, Man X, NP  Patient Care Team: Mast, Man X, NP as PCP - General (Internal Medicine) Nahser, Wonda Cheng, MD as PCP - Cardiology (Cardiology) Mast, Man X, NP as Nurse Practitioner (Nurse Practitioner) Bo Merino, MD as Consulting Physician (Rheumatology) Curly Rim, MD as Consulting Physician (Internal Medicine) Ernst Spell, MD as Referring Physician (Urology) Charlotte Crumb, MD as Consulting Physician (Orthopedic Surgery) Festus Aloe, MD as Consulting Physician (Urology)  Extended Emergency Contact Information Primary Emergency Contact: [facility] Address: [address] [place]          [place], [place] [address] [name] of [place] Phone: [phone] Mobile Phone: [phone] Relation: Son Secondary Emergency Contact: Alessandra Grout [place] of [place] Phone: [phone] Relation: Other  Code Status:  DNR Goals of care: Advanced Directive information Advanced Directives 09/08/2017  Does Patient Have a Medical Advance Directive? Yes  Type of Advance Directive [place]  Does patient want to make changes to medical advance directive? No - Patient declined  Copy of [place] in Chart? Yes  Would patient like information on creating a medical advance directive? -     Chief Complaint  Patient presents with  . Acute Visit    discoloration of lower shins, a spot on the left cheek    HPI:  Pt is a 84 y.o. female seen today for a slightly red, scaly, about a quarter sized area @ left cheek, duration is uncertain, denied itching or pain, not ulcerated. Hx of venous insufficiency, on Furosemide 20mg  M W F, trace edema, no open wounds, but hyperpigmentation noted lowe R+L shins. HPI was provided with assistance of staff, the patient resides in [facility] for safety and care assistance, on Donepezil  to preserve memory.    Past Medical History:  Diagnosis Date  . Abnormality of gait 12/30/2015  . Arthritis   . Asthma   . Atrial fibrillation ([place])   . DD (diverticular disease) 10/26/2013  . 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 Diverticulitis   . Edema 12/30/2015  . Gout   . Gout 12/30/2015  . History of CVA (cerebrovascular accident)    Left basal ganglia   . Hyperlipemia   . Hypertension   . Liposarcoma ([place]) 12/20/2015   excised 12/20/15  . Mitral valve regurgitation   . Mitral valve regurgitation 12/30/2015  . OP (osteoporosis) 10/26/2013  . PAF (paroxysmal atrial fibrillation) ([place]) 07/28/2013  . Pseudogout of knee 07/28/2013   03/16/13 Uric acid 5.5   . Spondylosis of cervical joint 12/30/2015  . Spondylosis of cervical spine   . TI (tricuspid incompetence) 05/18/2011   Overview:  Moderate severe   . Unspecified vitamin D deficiency 07/28/2013   03/16/13 Vit D 24.6   . Urinary incontinence without sensory awareness 07/28/2013  . Venous (peripheral) insufficiency 02/22/2014   Past Surgical History:  Procedure Laterality Date  . COLONOSCOPY  2011   Dr. Cindie Laroche Jule Ser)  . remove fatty tumor  2017  . THUMB ARTHROSCOPY Right 2014   Dr. Burney Gauze  . TONSILLECTOMY  1938    Allergies  Allergen Reactions  . Ace Inhibitors Cough    Cough  . Codeine Nausea And Vomiting  . Penicillins Hives and Rash  . Sulfa Antibiotics Hives and Rash    Allergies as of 10/15/2017      Reactions   Ace Inhibitors Cough  Cough   Codeine Nausea And Vomiting   Penicillins Hives, Rash   Sulfa Antibiotics Hives, Rash      Medication List        Accurate as of 10/15/17 11:59 PM. Always use your most recent med list.          acetaminophen 500 MG tablet Commonly known as:  TYLENOL Take 500 mg by mouth daily as needed.   albuterol 108 (90 Base) MCG/ACT inhaler Commonly known as:  PROVENTIL HFA;VENTOLIN HFA Inhale 2 puffs into the lungs every 6 (six) hours as needed for  wheezing or shortness of breath.   apixaban 2.5 MG Tabs tablet Commonly known as:  ELIQUIS Take 1 tablet (2.5 mg total) by mouth 2 (two) times daily.   BLACK CHERRY CONCENTRATE PO Take 2 tablets by mouth daily.   CENTRUM SILVER ADULT 50+ PO Take 1 tablet by mouth every morning.   diltiazem 120 MG tablet Commonly known as:  CARDIZEM Take 120 mg by mouth daily.   donepezil 10 MG tablet Commonly known as:  ARICEPT Take 10 mg by mouth at bedtime.   FISH OIL PO Take 1 capsule by mouth daily.   furosemide 20 MG tablet Commonly known as:  LASIX Take 20 mg by mouth. ON Monday, Wednesday AND FRIDAY   losartan 100 MG tablet Commonly known as:  COZAAR Take 100 mg by mouth daily.   Melatonin 3 MG Tabs Take 3 mg by mouth at bedtime.   mirtazapine 7.5 MG tablet Commonly known as:  REMERON Take 7.5 mg by mouth at bedtime.   MYRBETRIQ 25 MG Tb24 tablet Generic drug:  mirabegron ER Take 25 mg by mouth daily.   omeprazole 20 MG capsule Commonly known as:  PRILOSEC Take 20 mg by mouth daily.   OXYGEN Inhale 2 L into the lungs continuous.   polyethylene glycol packet Commonly known as:  MIRALAX / GLYCOLAX Take 17 g by mouth daily.      ROS was provided with assistance of staff Review of Systems  Constitutional: Negative for activity change, appetite change, chills, diaphoresis, fatigue and fever.  HENT: Positive for hearing loss. Negative for voice change.   Respiratory: Negative for cough and shortness of breath.   Cardiovascular: Positive for leg swelling. Negative for chest pain and palpitations.  Gastrointestinal: Negative for abdominal distention, abdominal pain, constipation, diarrhea, nausea and vomiting.  Genitourinary: Positive for frequency. Negative for difficulty urinating, dysuria and urgency.  Musculoskeletal: Positive for gait problem.  Skin: Positive for color change.       Hemosiderin R+L lower shins. A slightly redness, scaly, a quarter sized, regular  border ares left cheek.   Neurological: Negative for dizziness, speech difficulty, weakness and headaches.       Memory lapses.   Hematological: Bruises/bleeds easily.  Psychiatric/Behavioral: Negative for agitation, behavioral problems, hallucinations and sleep disturbance. The patient is not nervous/anxious.     Immunization History  Administered Date(s) Administered  . Influenza Split 11/07/2009  . Influenza, High Dose Seasonal PF 01/10/2016, 11/18/2016  . Influenza,inj,quad, With Preservative 01/10/2016  . Influenza-Unspecified 11/09/2012, 11/27/2013, 11/08/2014, 11/21/2015  . Pneumococcal Conjugate-13 01/10/2016  . Pneumococcal Polysaccharide-23 02/09/2002  . Td 08/10/2003  . Tdap 08/06/2013  . Zoster 03/12/2005   Pertinent  Health Maintenance Due  Topic Date Due  . INFLUENZA VACCINE  09/09/2017  . DEXA SCAN  Completed  . PNA vac Low Risk Adult  Completed   Fall Risk  01/07/2017 11/18/2015 06/06/2015 06/06/2015 05/10/2014  Falls in the past  year? No No No No No  Number falls in past yr: - - - - -   Functional Status Survey:    Vitals:   10/15/17 1357  BP: 134/80  Pulse: 76  Resp: 20  Temp: 97.6 F (36.4 C)  SpO2: 93%  Weight: 138 lb (62.6 kg)   Body mass index is 24.84 kg/m. Physical Exam  Constitutional: She appears well-developed and well-nourished.  HENT:  Head: Normocephalic and atraumatic.  Eyes: Pupils are equal, round, and reactive to light. EOM are normal.  Neck: Normal range of motion. Neck supple. No JVD present. No thyromegaly present.  Cardiovascular: Normal rate and regular rhythm.  No murmur heard. Pulmonary/Chest: She has no wheezes. She has no rales.  Abdominal: Soft. Bowel sounds are normal.  Musculoskeletal: She exhibits edema.  Trace edema BLE  Neurological: She is alert. No cranial nerve deficit. She exhibits normal muscle tone. Coordination normal.  Oriented to person and place.   Skin: Skin is warm and dry. Capillary refill takes less  than 2 seconds. There is erythema.  Hemosiderin R+L lower shins. A slightly redness, scaly, a quarter sized, regular border ares left cheek, no ulceration. Varicose veins BLE   Psychiatric: She has a normal mood and affect. Her behavior is normal.    Labs reviewed: Recent Labs    01/04/17 0000 02/04/17 0655 03/11/17 1000 04/02/17 04/22/17 04/26/17 09/09/17  NA 140 140 142 140 141  --  140  K 4.0 4.1 3.8 4.5 3.9  --  4.4  CL 104 104 104  --   --  100 103  CO2 27 32 29  --   --  35 33  GLUCOSE 89 92 107*  --   --   --   --   BUN 19 22 15 20 8   --  21  CREATININE 0.86 0.76 0.73 0.7 0.6  --  0.8  CALCIUM 9.8 9.8 9.8  --   --  10.2 10.0   Recent Labs    01/04/17 0000 02/04/17 0655  03/11/17 1000 04/02/17 04/22/17 04/26/17 09/09/17  AST 22 24  --  28 17 18   --  24  ALT 18 23  --  25 17 16   --  22  ALKPHOS  --   --    < > 71 65 77  --  67  BILITOT 0.7 0.5  --  1.0  --   --   --   --   PROT 6.1 6.1  --  6.2*  --   --  6.2 6.2  ALBUMIN  --   --   --  3.5  --   --  3.8 4.0   < > = values in this interval not displayed.   Recent Labs    01/04/17 0000 02/04/17 0655 03/11/17 1000 04/02/17 04/22/17 09/09/17  WBC 5.4 5.0 4.5 5.3 5.8 4.6  HGB 14.1 13.2 13.6 13.3 14.8 14.5  HCT 41.5 37.6 41.4 38 43 42  MCV 94.7 91.3 98.6  --   --   --   PLT 234 246 204 217 326 215   Lab Results  Component Value Date   TSH 0.99 09/09/2017   No results found for: HGBA1C Lab Results  Component Value Date   CHOL 150 02/04/2017   HDL 72 02/04/2017   LDLCALC 65 02/04/2017   TRIG 54 02/04/2017   CHOLHDL 2.1 02/04/2017    Significant Diagnostic Results in last 30 days:  No results found.  Assessment/Plan  Skin lesion of cheek a slightly red, scaly, about a quarter sized area @ left cheek, duration is uncertain, denied itching or pain, not ulcerated. Will try 1% hydrocortisone cream bid to affected area x 4 weeks. May refer to dermatology if no better.     Venous (peripheral)  insufficiency Brownish venous stasis pigmentation in R+L lower shin, no ulceration, denied pain. Minimal swelling BLE. Continue Furosemide 20mg  M W F. The patient understood the nature of brownish skin color changes in BLE.   Dementia with behavioral disturbance Continue [facility] for safety and care assistance, continue Donepezil for memory   Family/ staff Communication: plan of care reviewed with the patient and charge nurse.   Labs/tests ordered: none  Time spend 25 minutes.

## 2017-10-15 NOTE — Assessment & Plan Note (Addendum)
Brownish venous stasis pigmentation in R+L lower shin, no ulceration, denied pain. Minimal swelling BLE. Continue Furosemide 20mg  M W F. The patient understood the nature of brownish skin color changes in BLE.

## 2017-10-15 NOTE — Assessment & Plan Note (Addendum)
a slightly red, scaly, about a quarter sized area @ left cheek, duration is uncertain, denied itching or pain, not ulcerated. Will try 1% hydrocortisone cream bid to affected area x 4 weeks. May refer to dermatology if no better.

## 2017-10-15 NOTE — Assessment & Plan Note (Signed)
Continue [facility] for safety and care assistance, continue Donepezil for memory.  

## 2017-10-18 ENCOUNTER — Encounter: Payer: Self-pay | Admitting: Nurse Practitioner

## 2017-11-05 ENCOUNTER — Non-Acute Institutional Stay: Payer: Medicare Other

## 2017-11-05 DIAGNOSIS — Z0000 Encounter for general adult medical examination without abnormal findings: Secondary | ICD-10-CM

## 2017-11-05 NOTE — Patient Instructions (Addendum)
Diana Petersen , Thank you for taking time to come for your Medicare Wellness Visit. I appreciate your ongoing commitment to your health goals. Please review the following plan we discussed and let me know if I can assist you in the future.   Screening recommendations/referrals: Colonoscopy excluded, over age 82 Mammogram excluded, over age 28 Bone Density up to date Recommended yearly ophthalmology/optometry visit for glaucoma screening and checkup Recommended yearly dental visit for hygiene and checkup  Vaccinations: Influenza vaccine due, will receive at [facility] Pneumococcal vaccine up to date, completed Tdap vaccine up to date, due 08/07/2023 Shingles vaccine not in past records    Advanced directives: in chart  Conditions/risks identified: none  Next appointment: Dr. Bubba Camp makes rounds   Preventive Care 82 Years and Older, Female Preventive care refers to lifestyle choices and visits with your health care provider that can promote health and wellness. What does preventive care include?  A yearly physical exam. This is also called an annual well check.  Dental exams once or twice a year.  Routine eye exams. Ask your health care provider how often you should have your eyes checked.  Personal lifestyle choices, including:  Daily care of your teeth and gums.  Regular physical activity.  Eating a healthy diet.  Avoiding tobacco and drug use.  Limiting alcohol use.  Practicing safe sex.  Taking low-dose aspirin every day.  Taking vitamin and mineral supplements as recommended by your health care provider. What happens during an annual well check? The services and screenings done by your health care provider during your annual well check will depend on your age, overall health, lifestyle risk factors, and family history of disease. Counseling  Your health care provider may ask you questions about your:  Alcohol use.  Tobacco use.  Drug use.  Emotional  well-being.  Home and relationship well-being.  Sexual activity.  Eating habits.  History of falls.  Memory and ability to understand (cognition).  Work and work Statistician.  Reproductive health. Screening  You may have the following tests or measurements:  Height, weight, and BMI.  Blood pressure.  Lipid and cholesterol levels. These may be checked every 5 years, or more frequently if you are over 82 years old.  Skin check.  Lung cancer screening. You may have this screening every year starting at age 82 if you have a 30-pack-year history of smoking and currently smoke or have quit within the past 15 years.  Fecal occult blood test (FOBT) of the stool. You may have this test every year starting at age 82.  Flexible sigmoidoscopy or colonoscopy. You may have a sigmoidoscopy every 5 years or a colonoscopy every 10 years starting at age 36.  Hepatitis C blood test.  Hepatitis B blood test.  Sexually transmitted disease (STD) testing.  Diabetes screening. This is done by checking your blood sugar (glucose) after you have not eaten for a while (fasting). You may have this done every 1-3 years.  Bone density scan. This is done to screen for osteoporosis. You may have this done starting at age 82.  Mammogram. This may be done every 1-2 years. Talk to your health care provider about how often you should have regular mammograms. Talk with your health care provider about your test results, treatment options, and if necessary, the need for more tests. Vaccines  Your health care provider may recommend certain vaccines, such as:  Influenza vaccine. This is recommended every year.  Tetanus, diphtheria, and acellular pertussis (Tdap, Td) vaccine.  You may need a Td booster every 10 years.  Zoster vaccine. You may need this after age 82.  Pneumococcal 13-valent conjugate (PCV13) vaccine. One dose is recommended after age 82.  Pneumococcal polysaccharide (PPSV23) vaccine. One  dose is recommended after age 82. Talk to your health care provider about which screenings and vaccines you need and how often you need them. This information is not intended to replace advice given to you by your health care provider. Make sure you discuss any questions you have with your health care provider. Document Released: 02/22/2015 Document Revised: 10/16/2015 Document Reviewed: 11/27/2014 Elsevier Interactive Patient Education  2017 [place] Prevention in the Home Falls can cause injuries. They can happen to people of all ages. There are many things you can do to make your home safe and to help prevent falls. What can I do on the outside of my home?  Regularly fix the edges of walkways and driveways and fix any cracks.  Remove anything that might make you trip as you walk through a door, such as a raised step or threshold.  Trim any bushes or trees on the path to your home.  Use bright outdoor lighting.  Clear any walking paths of anything that might make someone trip, such as rocks or tools.  Regularly check to see if handrails are loose or broken. Make sure that both sides of any steps have handrails.  Any raised decks and porches should have guardrails on the edges.  Have any leaves, snow, or ice cleared regularly.  Use sand or salt on walking paths during winter.  Clean up any spills in your garage right away. This includes oil or grease spills. What can I do in the bathroom?  Use night lights.  Install grab bars by the toilet and in the tub and shower. Do not use towel bars as grab bars.  Use non-skid mats or decals in the tub or shower.  If you need to sit down in the shower, use a plastic, non-slip stool.  Keep the floor dry. Clean up any water that spills on the floor as soon as it happens.  Remove soap buildup in the tub or shower regularly.  Attach bath mats securely with double-sided non-slip rug tape.  Do not have throw rugs and other  things on the floor that can make you trip. What can I do in the bedroom?  Use night lights.  Make sure that you have a light by your bed that is easy to reach.  Do not use any sheets or blankets that are too big for your bed. They should not hang down onto the floor.  Have a firm chair that has side arms. You can use this for support while you get dressed.  Do not have throw rugs and other things on the floor that can make you trip. What can I do in the kitchen?  Clean up any spills right away.  Avoid walking on wet floors.  Keep items that you use a lot in easy-to-reach places.  If you need to reach something above you, use a strong step stool that has a grab bar.  Keep electrical cords out of the way.  Do not use floor polish or wax that makes floors slippery. If you must use wax, use non-skid floor wax.  Do not have throw rugs and other things on the floor that can make you trip. What can I do with my stairs?  Do not leave any  items on the stairs.  Make sure that there are handrails on both sides of the stairs and use them. Fix handrails that are broken or loose. Make sure that handrails are as long as the stairways.  Check any carpeting to make sure that it is firmly attached to the stairs. Fix any carpet that is loose or worn.  Avoid having throw rugs at the top or bottom of the stairs. If you do have throw rugs, attach them to the floor with carpet tape.  Make sure that you have a light switch at the top of the stairs and the bottom of the stairs. If you do not have them, ask someone to add them for you. What else can I do to help prevent falls?  Wear shoes that:  Do not have high heels.  Have rubber bottoms.  Are comfortable and fit you well.  Are closed at the toe. Do not wear sandals.  If you use a stepladder:  Make sure that it is fully opened. Do not climb a closed stepladder.  Make sure that both sides of the stepladder are locked into place.  Ask  someone to hold it for you, if possible.  Clearly mark and make sure that you can see:  Any grab bars or handrails.  First and last steps.  Where the edge of each step is.  Use tools that help you move around (mobility aids) if they are needed. These include:  Canes.  Walkers.  Scooters.  Crutches.  Turn on the lights when you go into a dark area. Replace any light bulbs as soon as they burn out.  Set up your furniture so you have a clear path. Avoid moving your furniture around.  If any of your floors are uneven, fix them.  If there are any pets around you, be aware of where they are.  Review your medicines with your doctor. Some medicines can make you feel dizzy. This can increase your chance of falling. Ask your doctor what other things that you can do to help prevent falls. This information is not intended to replace advice given to you by your health care provider. Make sure you discuss any questions you have with your health care provider. Document Released: 11/22/2008 Document Revised: 07/04/2015 Document Reviewed: 03/02/2014 Elsevier Interactive Patient Education  2017 [employer].

## 2017-11-05 NOTE — Progress Notes (Signed)
Subjective:   Diana Petersen is a 84 y.o. female who presents for Medicare Annual (Subsequent) preventive examination at [facility]-01/07/2017    Objective:     Vitals: BP 128/80 (BP Location: Left Arm, Patient Position: Supine)   Pulse 78   Temp 97.8 F (36.6 C) (Oral)   Ht 5\' 2"  (1.575 m)   Wt 138 lb (62.6 kg)   BMI 25.24 kg/m   Body mass index is 25.24 kg/m.  Advanced Directives 11/05/2017 09/08/2017 07/06/2017 06/01/2017 05/10/2017 04/21/2017 03/11/2017  Does Patient Have a Medical Advance Directive? Yes Yes Yes Yes Yes No No  Type of [place] administrator Power of [place] of [place] - -  Does patient want to make changes to medical advance directive? No - Patient declined No - Patient declined No - Patient declined No - Patient declined No - Patient declined - -  Copy of [place] in Chart? Yes Yes Yes Yes Yes - -  Would patient like information on creating a medical advance directive? - - No - Patient declined - No - Patient declined No - Patient declined -    Tobacco Social History   Tobacco Use  Smoking Status Former Smoker  . Last attempt to quit: 07/28/1979  . Years since quitting: 38.3  Smokeless Tobacco Never Used     Counseling given: Not Answered   Clinical Intake:  Pre-visit preparation completed: No  Pain : No/denies pain     Diabetes: No  How often do you need to have someone help you when you read instructions, pamphlets, or other written materials from your doctor or pharmacy?: 2 - Rarely What is the last grade level you completed in school?: college  Interpreter Needed?: No  Information entered by :: Tyson Dense, RN  Past Medical History:  Diagnosis Date  . Abnormality of gait 12/30/2015  . Arthritis   . Asthma   . Atrial fibrillation ([place])   . DD (diverticular disease)  10/26/2013  . 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 Diverticulitis   . Edema 12/30/2015  . Gout   . Gout 12/30/2015  . History of CVA (cerebrovascular accident)    Left basal ganglia   . Hyperlipemia   . Hypertension   . 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 Mitral valve regurgitation   . Mitral valve regurgitation 12/30/2015  . OP (osteoporosis) 10/26/2013  . PAF (paroxysmal atrial fibrillation) ([place]) 07/28/2013  . Pseudogout of knee 07/28/2013   03/16/13 Uric acid 5.5   . Spondylosis of cervical joint 12/30/2015  . Spondylosis of cervical spine   . TI (tricuspid incompetence) 05/18/2011   Overview:  Moderate severe   . Unspecified vitamin D deficiency 07/28/2013   03/16/13 Vit D 24.6   . Urinary incontinence without sensory awareness 07/28/2013  . Venous (peripheral) insufficiency 02/22/2014   Past Surgical History:  Procedure Laterality Date  . COLONOSCOPY  2011   Dr. Cindie Laroche Jule Ser)  . remove fatty tumor  2017  . THUMB ARTHROSCOPY Right 2014   Dr. Burney Gauze  . TONSILLECTOMY  1938   Family History  Problem Relation Age of Onset  . Alzheimer's disease Mother   . Heart disease Father   . Heart disease Brother    Social History   Socioeconomic History  . Marital status: Widowed    Spouse name: Not on file  . Number of children:  Not on file  . Years of education: Not on file  . Highest education level: Not on file  Occupational History  . Occupation: retired Doctor, hospital  . Financial resource strain: Not hard at all  . Food insecurity:    Worry: Never true    Inability: Never true  . Transportation needs:    Medical: No    Non-medical: No  Tobacco Use  . Smoking status: Former Smoker    Last attempt to quit: 07/28/1979    Years since quitting: 38.3  . Smokeless tobacco: Never Used  Substance and Sexual Activity  . Alcohol use: No    Comment: Has not drank in months.   . Drug use: No  . Sexual activity: Never  Lifestyle  .  Physical activity:    Days per week: 2 days    Minutes per session: 30 min  . Stress: Only a little  Relationships  . Social connections:    Talks on phone: More than three times a week    Gets together: More than three times a week    Attends religious service: 1 to 4 times per year    Active member of club or organization: Yes    Attends meetings of clubs or organizations: 1 to 4 times per year    Relationship status: Widowed  Other Topics Concern  . Not on file  Social History Narrative   [facility] at [facility] since 2013   Widowed   Former smoker, stopped 1981   Alcohol one glass of wine couple nights a week   Exercise pool exercise 3 times a week   POA             Outpatient Encounter Medications as of 11/05/2017  Medication Sig  . acetaminophen (TYLENOL) 500 MG tablet Take 500 mg by mouth daily as needed.  Marland Kitchen albuterol (PROVENTIL HFA;VENTOLIN HFA) 108 (90 Base) MCG/ACT inhaler Inhale 2 puffs into the lungs every 6 (six) hours as needed for wheezing or shortness of breath.  Marland Kitchen apixaban (ELIQUIS) 2.5 MG TABS tablet Take 1 tablet (2.5 mg total) by mouth 2 (two) times daily.  Marland Kitchen diltiazem (CARDIZEM) 120 MG tablet Take 120 mg by mouth daily.  Marland Kitchen donepezil (ARICEPT) 10 MG tablet Take 10 mg by mouth at bedtime.  . furosemide (LASIX) 20 MG tablet Take 20 mg by mouth. ON Monday, Wednesday AND FRIDAY  . losartan (COZAAR) 100 MG tablet Take 100 mg by mouth daily.  . Melatonin 3 MG TABS Take 3 mg by mouth at bedtime.  . mirabegron ER (MYRBETRIQ) 25 MG TB24 tablet Take 25 mg by mouth daily.  . mirtazapine (REMERON) 7.5 MG tablet Take 7.5 mg by mouth at bedtime.  . Misc Natural Products (BLACK CHERRY CONCENTRATE PO) Take 2 tablets by mouth daily.  . Multiple Vitamins-Minerals (CENTRUM SILVER ADULT 50+ PO) Take 1 tablet by mouth every morning.   . Omega-3 Fatty Acids (FISH OIL PO) Take 1 capsule by mouth daily.  Marland Kitchen omeprazole (PRILOSEC) 20 MG capsule Take 20 mg by mouth daily.  .  OXYGEN Inhale 2 L into the lungs continuous.  . polyethylene glycol (MIRALAX / GLYCOLAX) packet Take 17 g by mouth daily.   No facility-administered encounter medications on file as of 11/05/2017.     Activities of Daily Living In your present state of health, do you have any difficulty performing the following activities: 11/05/2017 01/07/2017  Hearing? Y N  Vision? N N  Difficulty concentrating  or making decisions? Tempie Donning  Walking or climbing stairs? Y N  Dressing or bathing? Y N  Doing errands, shopping? Y N  Preparing Food and eating ? Y N  Using the Toilet? Y N  In the past six months, have you accidently leaked urine? Y Y  Do you have problems with loss of bowel control? N N  Managing your Medications? Y N  Managing your Finances? Y N  Housekeeping or managing your Housekeeping? Y N  Some recent data might be hidden    Patient Care Team: Mast, Man X, NP as PCP - General (Internal Medicine) Nahser, Wonda Cheng, MD as PCP - Cardiology (Cardiology) Mast, Man X, NP as Nurse Practitioner (Nurse Practitioner) Bo Merino, MD as Consulting Physician (Rheumatology) Curly Rim, MD as Consulting Physician (Internal Medicine) Ernst Spell, MD as Referring Physician (Urology) Charlotte Crumb, MD as Consulting Physician (Orthopedic Surgery) Festus Aloe, MD as Consulting Physician (Urology)    Assessment:   This is a routine wellness examination for Diana Petersen.  Exercise Activities and Dietary recommendations Current Exercise Habits: Structured exercise class, Type of exercise: stretching, Time (Minutes): 30, Frequency (Times/Week): 2, Weekly Exercise (Minutes/Week): 60, Intensity: Mild  Goals   None     Fall Risk Fall Risk  11/05/2017 01/07/2017 11/18/2015 06/06/2015 06/06/2015  Falls in the past year? No No No No No  Number falls in past yr: - - - - -   Is the patient's home free of loose throw rugs in walkways, pet beds, electrical cords, etc?   yes      Grab bars in  the bathroom? yes      Handrails on the stairs?   yes      Adequate lighting?   yes  Depression Screen PHQ 2/9 Scores 11/05/2017 01/07/2017 05/10/2014 04/05/2014  PHQ - 2 Score 0 0 0 0     Cognitive Function completed within the last year MMSE - Mini Mental State Exam 04/23/2017 01/07/2017  Orientation to time 2 3  Orientation to Place 5 5  Registration 3 3  Attention/ Calculation 5 5  Recall 2 0  Language- name 2 objects 2 2  Language- repeat 1 1  Language- follow 3 step command 3 3  Language- read & follow direction 1 1  Write a sentence 1 1  Copy design 1 1  Total score 26 25        Immunization History  Administered Date(s) Administered  . Influenza Split 11/07/2009  . Influenza, High Dose Seasonal PF 01/10/2016, 11/18/2016  . Influenza,inj,quad, With Preservative 01/10/2016  . Influenza-Unspecified 11/09/2012, 11/27/2013, 11/08/2014, 11/21/2015  . Pneumococcal Conjugate-13 01/10/2016  . Pneumococcal Polysaccharide-23 02/09/2002  . Td 08/10/2003  . Tdap 08/06/2013  . Zoster 03/12/2005    Qualifies for Shingles Vaccine? Not in past records  Screening Tests Health Maintenance  Topic Date Due  . INFLUENZA VACCINE  09/09/2017  . TETANUS/TDAP  08/07/2023  . DEXA SCAN  Completed  . PNA vac Low Risk Adult  Completed    Cancer Screenings: Lung: Low Dose CT Chest recommended if Age 27-80 years, 30 pack-year currently smoking OR have quit w/in 15years. Patient does not qualify. Breast:  Up to date on Mammogram? Yes   Up to date of Bone Density/Dexa? Yes Colorectal: up to date  Additional Screenings: Hepatitis C Screening: declined Flu vaccine due: will receive at [facility]:    I have personally reviewed and addressed the Medicare Annual Wellness questionnaire and have noted  the following in the patient's chart:  A. Medical and social history B. Use of alcohol, tobacco or illicit drugs  C. Current medications and supplements D. Functional ability and  status E.  Nutritional status F.  Physical activity G. Advance directives H. List of other physicians I.  Hospitalizations, surgeries, and ER visits in previous 12 months J.  [place] to include hearing, vision, cognitive, depression L. Referrals and appointments - none  In addition, I have reviewed and discussed with patient certain preventive protocols, quality metrics, and best practice recommendations. A written personalized care plan for preventive services as well as general preventive health recommendations were provided to patient.  See attached scanned questionnaire for additional information.   Signed,   Tyson Dense, RN Nurse Health Advisor  Patient Concerns: None

## 2017-11-30 ENCOUNTER — Encounter: Payer: Self-pay | Admitting: Cardiovascular Disease

## 2017-11-30 ENCOUNTER — Ambulatory Visit (INDEPENDENT_AMBULATORY_CARE_PROVIDER_SITE_OTHER): Payer: Medicare Other | Admitting: Cardiovascular Disease

## 2017-11-30 VITALS — BP 120/50 | HR 78 | Ht 62.0 in | Wt 141.0 lb

## 2017-11-30 DIAGNOSIS — I480 Paroxysmal atrial fibrillation: Secondary | ICD-10-CM

## 2017-11-30 DIAGNOSIS — I5032 Chronic diastolic (congestive) heart failure: Secondary | ICD-10-CM | POA: Diagnosis not present

## 2017-11-30 NOTE — Patient Instructions (Signed)
Medication Instructions:  The current medical regimen is effective;  continue present plan and medications.  If you need a refill on your cardiac medications before your next appointment, please call your pharmacy.   Follow-Up: At [facility], you and your health needs are our priority.  As part of our continuing mission to provide you with exceptional heart care, we have created designated Provider Care Teams.  These Care Teams include your primary Cardiologist (physician) and Advanced Practice Providers (APPs -  Physician Assistants and Nurse Practitioners) who all work together to provide you with the care you need, when you need it. You will need a follow up appointment in:  12 months.  Please call our office 2 months in advance to schedule this appointment.  You may see Mertie Moores, MD or one of the following Advanced Practice Providers on your designated Care Team: Richardson Dopp, PA-C [place], [place] . Daune Perch, NP   Thank you for choosing [facility]!!

## 2017-11-30 NOTE — Progress Notes (Signed)
Cardiology Office Note:    Date:  11/30/2017   ID:  Diana Petersen, DOB Apr 14, 1929, MRN [id number]  PCP:  Mast, Man X, NP  Cardiologist:  Mertie Moores, MD    Referring MD: Mast, Man X, NP   Chief Complaint  Patient presents with  . Congestive Heart Failure    Problem list 1. Paroxysmal Atrial fibrillation 2. Pulmonary hypertension 3.  Essential HTN       Diana Petersen is a 84 y.o. female with a hx of  Paroxysmal atrial fib  She is previously seen by Dr. Clovia Cuff at  [facility].   Has pulmonary HTN mentioned in her notes.  Has some DOE   Lives at [facility] ( independent)  Still does all of her normal activities.   Still drives short distances ( limited by her old car)   May 12, 2017:  Ms. Staron is seen today for follow up visit .  Seen with son, Nicole Kindred.  She has a history of paroxysmal atrial fibrillation.  She has a history of chronic diastolic dysfunction  ( grade 1 diastolic dysfunction) and is on Lasix.  She has had lots of problems with constipation.  Has occasional episodes of DOE,    No significant leg swelling  Was in independent living ,   Now is in assisted living     August 10, 2017:     Diana Petersen is seen back today for follow-up of her chronic diastolic congestive heart failure and paroxysmal atrial fibrillation. Lives at [facility] - assisted living   November 30, 2017:  Diana Petersen is seen today for follow-up visit of her chronic diastolic congestive heart failure and paroxysmal atrial fibrillation.  She is living at friend's home in the assisted living. No dyspnea.  No CP  Does not do much .  Reads quite a bit .  Not limited by her dspnea.  Does not sleep well. I suggested she ask her primary MD about that    Past Medical History:  Diagnosis Date  . Abnormality of gait 12/30/2015  . Arthritis   . Asthma   . Atrial fibrillation ([place])   . DD (diverticular disease) 10/26/2013  . 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 Diverticulitis   . Edema  12/30/2015  . Gout   . Gout 12/30/2015  . History of CVA (cerebrovascular accident)    Left basal ganglia   . Hyperlipemia   . Hypertension   . 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 Mitral valve regurgitation   . Mitral valve regurgitation 12/30/2015  . OP (osteoporosis) 10/26/2013  . PAF (paroxysmal atrial fibrillation) ([place]) 07/28/2013  . Pseudogout of knee 07/28/2013   03/16/13 Uric acid 5.5   . Spondylosis of cervical joint 12/30/2015  . Spondylosis of cervical spine   . TI (tricuspid incompetence) 05/18/2011   Overview:  Moderate severe   . Unspecified vitamin D deficiency 07/28/2013   03/16/13 Vit D 24.6   . Urinary incontinence without sensory awareness 07/28/2013  . Venous (peripheral) insufficiency 02/22/2014    Past Surgical History:  Procedure Laterality Date  . COLONOSCOPY  2011   Dr. Cindie Laroche Jule Ser)  . remove fatty tumor  2017  . THUMB ARTHROSCOPY Right 2014   Dr. Burney Gauze  . TONSILLECTOMY  1938    Current Medications: Current Meds  Medication Sig  . acetaminophen (TYLENOL) 500 MG tablet Take 500 mg by mouth daily as needed.  Marland Kitchen albuterol (PROVENTIL HFA;VENTOLIN HFA) 108 (90 Base) MCG/ACT inhaler Inhale 2 puffs into  the lungs every 6 (six) hours as needed for wheezing or shortness of breath.  Marland Kitchen apixaban (ELIQUIS) 2.5 MG TABS tablet Take 1 tablet (2.5 mg total) by mouth 2 (two) times daily.  Marland Kitchen diltiazem (CARDIZEM) 120 MG tablet Take 120 mg by mouth daily.  Marland Kitchen donepezil (ARICEPT) 10 MG tablet Take 10 mg by mouth at bedtime.  . furosemide (LASIX) 20 MG tablet Take 20 mg by mouth. ON Monday, Wednesday AND FRIDAY  . losartan (COZAAR) 100 MG tablet Take 100 mg by mouth daily.  . Melatonin 3 MG TABS Take 3 mg by mouth at bedtime.  . mirabegron ER (MYRBETRIQ) 25 MG TB24 tablet Take 25 mg by mouth daily.  . mirtazapine (REMERON) 7.5 MG tablet Take 7.5 mg by mouth at bedtime.  . Misc Natural Products (BLACK CHERRY CONCENTRATE PO) Take 2 tablets by mouth  daily.  . Multiple Vitamins-Minerals (CENTRUM SILVER ADULT 50+ PO) Take 1 tablet by mouth every morning.   . Omega-3 Fatty Acids (FISH OIL PO) Take 1 capsule by mouth daily.  Marland Kitchen omeprazole (PRILOSEC) 20 MG capsule Take 20 mg by mouth daily.  . OXYGEN Inhale 2 L into the lungs continuous.  . polyethylene glycol (MIRALAX / GLYCOLAX) packet Take 17 g by mouth daily.     Allergies:   Ace inhibitors; Codeine; Penicillins; and Sulfa antibiotics   Social History   Socioeconomic History  . Marital status: Widowed    Spouse name: Not on file  . Number of children: Not on file  . Years of education: Not on file  . Highest education level: Not on file  Occupational History  . Occupation: retired Doctor, hospital  . Financial resource strain: Not hard at all  . Food insecurity:    Worry: Never true    Inability: Never true  . Transportation needs:    Medical: No    Non-medical: No  Tobacco Use  . Smoking status: Former Smoker    Last attempt to quit: 07/28/1979    Years since quitting: 38.3  . Smokeless tobacco: Never Used  Substance and Sexual Activity  . Alcohol use: No    Comment: Has not drank in months.   . Drug use: No  . Sexual activity: Never  Lifestyle  . Physical activity:    Days per week: 2 days    Minutes per session: 30 min  . Stress: Only a little  Relationships  . Social connections:    Talks on phone: More than three times a week    Gets together: More than three times a week    Attends religious service: 1 to 4 times per year    Active member of club or organization: Yes    Attends meetings of clubs or organizations: 1 to 4 times per year    Relationship status: Widowed  Other Topics Concern  . Not on file  Social History Narrative   Lives at [facility] since 2013   Widowed   Former smoker, stopped 1981   Alcohol one glass of wine couple nights a week   Exercise pool exercise 3 times a week   POA              Family History: The  patient's family history includes Alzheimer's disease in her mother; Heart disease in her brother and father. ROS:   Please see the history of present illness.     All other systems reviewed and are negative.  Physical Exam: Blood pressure Marland Kitchen)  120/50, pulse 78, height 5\' 2"  (1.575 m), weight 141 lb (64 kg), SpO2 94 %.  GEN:  Elderly female,  NAD  HEENT: Normal NECK: No JVD; No carotid bruits LYMPHATICS: No lymphadenopathy CARDIAC: RRR  RESPIRATORY:  Clear to auscultation without rales, wheezing or rhonchi  ABDOMEN: Soft, non-tender, non-distended MUSCULOSKELETAL:  No edema; No deformity  SKIN: Warm and dry NEUROLOGIC:  Alert and oriented x 3    EKGs/Labs/Other Studies Reviewed:    The following studies were reviewed today:   EKG:       Recent Labs: 09/09/2017: ALT 22; BUN 21; Creatinine 0.8; Hemoglobin 14.5; Platelets 215; Potassium 4.4; Sodium 140; TSH 0.99  Recent Lipid Panel    Component Value Date/Time   CHOL 150 02/04/2017 0655   CHOL 196 05/28/2015   TRIG 54 02/04/2017 0655   TRIG 75 05/28/2015   HDL 72 02/04/2017 0655   CHOLHDL 2.1 02/04/2017 0655   VLDL 15 05/28/2015   LDLCALC 65 02/04/2017 0655   LDLCALC 119 05/28/2015      ASSESSMENT:     PLAN:       1. Paroxysmal atrial fibrillation:    Is maintaining NSR  Continue Eliquis 2.5 mg BID   2.  Chronic diastolic congestive heart failure.   contintinue LAsix 20 mg a  Day .   Labs in august look great   2.  Mild pulmonary hypertension: stable   Medication Adjustments/Labs and Tests Ordered: Current medicines are reviewed at length with the patient today.  Concerns regarding medicines are outlined above.  No orders of the defined types were placed in this encounter.  No orders of the defined types were placed in this encounter.   Signed, Mertie Moores, MD  11/30/2017 8:38 AM    [facility]

## 2017-12-07 ENCOUNTER — Encounter: Payer: Self-pay | Admitting: Nurse Practitioner

## 2017-12-07 ENCOUNTER — Non-Acute Institutional Stay: Payer: Medicare Other | Admitting: Nurse Practitioner

## 2017-12-07 DIAGNOSIS — K5901 Slow transit constipation: Secondary | ICD-10-CM | POA: Diagnosis not present

## 2017-12-07 DIAGNOSIS — K219 Gastro-esophageal reflux disease without esophagitis: Secondary | ICD-10-CM | POA: Diagnosis not present

## 2017-12-07 DIAGNOSIS — F418 Other specified anxiety disorders: Secondary | ICD-10-CM | POA: Diagnosis not present

## 2017-12-07 DIAGNOSIS — N3942 Incontinence without sensory awareness: Secondary | ICD-10-CM | POA: Diagnosis not present

## 2017-12-07 DIAGNOSIS — I5032 Chronic diastolic (congestive) heart failure: Secondary | ICD-10-CM

## 2017-12-07 DIAGNOSIS — F5104 Psychophysiologic insomnia: Secondary | ICD-10-CM

## 2017-12-07 DIAGNOSIS — I480 Paroxysmal atrial fibrillation: Secondary | ICD-10-CM

## 2017-12-07 DIAGNOSIS — F0391 Unspecified dementia with behavioral disturbance: Secondary | ICD-10-CM

## 2017-12-07 DIAGNOSIS — I10 Essential (primary) hypertension: Secondary | ICD-10-CM

## 2017-12-07 NOTE — Assessment & Plan Note (Signed)
constipation, stable, continue MiraLax daily.

## 2017-12-07 NOTE — Assessment & Plan Note (Signed)
Persists, dc Myrbetriq, no longer beneficial.

## 2017-12-07 NOTE — Assessment & Plan Note (Signed)
Difficulty falling asleep, stays up to 3-4am sometimes, she compensating her rest by napping during day. Will have Ambien 5mg  hs available to her. Observe for unwanted side effects.

## 2017-12-07 NOTE — Progress Notes (Signed)
Location:  [place] Room Number: 109 Place of Service:  ALF [phone]) Provider: Marlana Latus  NP  Mast, Man X, NP  Patient Care Team: Mast, Man X, NP as PCP - General (Internal Medicine) Nahser, Wonda Cheng, MD as PCP - Cardiology (Cardiology) Mast, Man X, NP as Nurse Practitioner (Nurse Practitioner) Bo Merino, MD as Consulting Physician (Rheumatology) Curly Rim, MD as Consulting Physician (Internal Medicine) Ernst Spell, MD as Referring Physician (Urology) Charlotte Crumb, MD as Consulting Physician (Orthopedic Surgery) Festus Aloe, MD as Consulting Physician (Urology)  Extended Emergency Contact Information Primary Emergency Contact: [facility] Address: [address]          [place], [place] [address] [name] of [place] Phone: [phone] [place] Phone: [phone] Relation: Son Secondary Emergency Contact: Alessandra Grout [place] of [place] Phone: [phone] Relation: Other  Code Status:  Full Code Goals of care: Advanced Directive information Advanced Directives 12/07/2017  Does Patient Have a Medical Advance Directive? Yes  Type of Advance Directive [place]  Does patient want to make changes to medical advance directive? No - Patient declined  Copy of [place] in Chart? Yes  Would patient like information on creating a medical advance directive? -     Chief Complaint  Patient presents with  . Medical Management of Chronic Issues    F/u-HTN, dementis, CHF, PAF, depression, insomnia    HPI:  Pt is a 84 y.o. female seen today for medical management of chronic diseases.     The patient has history of constipation, stable on MiraLax daily. GERD sable on Omeprazole 20mg  qd. Her mood is stable, eats well. Urinary incontinent, uses adult depends, no longer getting up at night for bathroom trips,  on Mybetriq 25mg  qd. CHF, compensated, on Furosemide 20mg   qd. Afib, heart rate is in control, on Diltiazem 120mg  qd. Eliquis 2.5mg  bid for thromboembolic risk reduction. HTN, blood pressure is in controled. She resides in [place] for safety and care assistance, on Donepezil 10mg  qd for memory.  Past Medical History:  Diagnosis Date  . Abnormality of gait 12/30/2015  . Arthritis   . Asthma   . Atrial fibrillation ([place])   . DD (diverticular disease) 10/26/2013  . 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 Diverticulitis   . Edema 12/30/2015  . Gout   . Gout 12/30/2015  . History of CVA (cerebrovascular accident)    Left basal ganglia   . Hyperlipemia   . Hypertension   . Liposarcoma ([place]) 12/20/2015   excised 12/20/15  . Mitral valve regurgitation   . Mitral valve regurgitation 12/30/2015  . OP (osteoporosis) 10/26/2013  . PAF (paroxysmal atrial fibrillation) ([place]) 07/28/2013  . Pseudogout of knee 07/28/2013   03/16/13 Uric acid 5.5   . Spondylosis of cervical joint 12/30/2015  . Spondylosis of cervical spine   . TI (tricuspid incompetence) 05/18/2011   Overview:  Moderate severe   . Unspecified vitamin D deficiency 07/28/2013   03/16/13 Vit D 24.6   . Urinary incontinence without sensory awareness 07/28/2013  . Venous (peripheral) insufficiency 02/22/2014   Past Surgical History:  Procedure Laterality Date  . COLONOSCOPY  2011   Dr. Cindie Laroche Jule Ser)  . remove fatty tumor  2017  . THUMB ARTHROSCOPY Right 2014   Dr. Burney Gauze  . TONSILLECTOMY  1938    Allergies  Allergen Reactions  . Ace Inhibitors Cough    Cough  . Codeine Nausea And Vomiting  . Penicillins  Hives and Rash  . Sulfa Antibiotics Hives and Rash    Outpatient Encounter Medications as of 12/07/2017  Medication Sig  . acetaminophen (TYLENOL) 500 MG tablet Take 500 mg by mouth daily as needed.  [place] Kitchen albuterol (PROVENTIL HFA;VENTOLIN HFA) 108 (90 Base) MCG/ACT inhaler Inhale 2 puffs into the lungs every 6 (six) hours as needed for wheezing or shortness of breath.  [place] Kitchen  apixaban (ELIQUIS) 2.5 MG TABS tablet Take 1 tablet (2.5 mg total) by mouth 2 (two) times daily.  [place] Kitchen diltiazem (CARDIZEM) 120 MG tablet Take 120 mg by mouth daily.  [place] Kitchen donepezil (ARICEPT) 10 MG tablet Take 10 mg by mouth at bedtime.  . furosemide (LASIX) 20 MG tablet Take 20 mg by mouth. ON Monday, Wednesday AND FRIDAY  . Melatonin 3 MG TABS Take 3 mg by mouth at bedtime.  . mirabegron ER (MYRBETRIQ) 25 MG TB24 tablet Take 25 mg by mouth daily.  . mirtazapine (REMERON) 7.5 MG tablet Take 7.5 mg by mouth at bedtime.  . Misc Natural Products (BLACK CHERRY CONCENTRATE PO) Take 2 tablets by mouth daily.  . Multiple Vitamins-Minerals (CENTRUM SILVER ADULT 50+ PO) Take 1 tablet by mouth every morning.   . Omega-3 Fatty Acids (FISH OIL PO) Take 1 capsule by mouth daily.  [place] Kitchen omeprazole (PRILOSEC) 20 MG capsule Take 20 mg by mouth daily. Give 30 minutes prior to meal.  . OXYGEN Inhale 2 L into the lungs continuous.  . polyethylene glycol (MIRALAX / GLYCOLAX) packet Take 17 g by mouth daily.  . [DISCONTINUED] losartan (COZAAR) 100 MG tablet Take 100 mg by mouth daily.   No facility-administered encounter medications on file as of 12/07/2017.     Review of Systems  Constitutional: Negative for activity change, appetite change, chills, diaphoresis, fatigue and fever.  HENT: Positive for hearing loss. Negative for congestion and voice change.   Respiratory: Negative for cough, shortness of breath and wheezing.   Cardiovascular: Positive for leg swelling. Negative for chest pain and palpitations.  Gastrointestinal: Negative for abdominal distention, abdominal pain, constipation, diarrhea, nausea and vomiting.  Genitourinary: Negative for difficulty urinating, dysuria, frequency and urgency.       Incontinent of urine all the time, no longer getting up to bathroom at night.   Musculoskeletal: Positive for arthralgias and gait problem.  Skin: Positive for color change. Negative for pallor.       Brownish  pigmented skin changes shins.   Neurological: Negative for dizziness, speech difficulty, weakness and headaches.       Memory lapses.   Psychiatric/Behavioral: Positive for sleep disturbance. Negative for agitation, behavioral problems and hallucinations. The patient is not nervous/anxious.        Difficulty falling asleep    Immunization History  Administered Date(s) Administered  . Influenza Split 11/07/2009  . Influenza Whole 11/11/2017  . Influenza, High Dose Seasonal PF 01/10/2016, 11/18/2016  . Influenza,inj,quad, With Preservative 01/10/2016  . Influenza-Unspecified 11/09/2012, 11/27/2013, 11/08/2014, 11/21/2015  . Pneumococcal Conjugate-13 01/10/2016  . Pneumococcal Polysaccharide-23 02/09/2002  . Td 08/10/2003  . Tdap 08/06/2013  . Zoster 03/12/2005   Pertinent  Health Maintenance Due  Topic Date Due  . INFLUENZA VACCINE  Completed  . DEXA SCAN  Completed  . PNA vac Low Risk Adult  Completed   Fall Risk  11/05/2017 01/07/2017 11/18/2015 06/06/2015 06/06/2015  Falls in the past year? No No No No No  Number falls in past yr: - - - - -   Functional Status Survey:    Vitals:  12/07/17 1345  BP: 130/60  Pulse: 70  Resp: 20  Temp: 97.6 F (36.4 C)  SpO2: 91%  Weight: 138 lb (62.6 kg)  Height: 5\' 2"  (1.575 m)   Body mass index is 25.24 kg/m. Physical Exam  Constitutional: She appears well-developed and well-nourished.  HENT:  Head: Normocephalic and atraumatic.  Eyes: Pupils are equal, round, and reactive to light. EOM are normal.  Neck: Normal range of motion. Neck supple. No JVD present. No thyromegaly present.  Cardiovascular: Normal rate and regular rhythm.  No murmur heard. Pulmonary/Chest: Effort normal. She has no wheezes. She has no rales.  Abdominal: Soft. She exhibits no distension. There is no tenderness. There is no rebound and no guarding.  Musculoskeletal: She exhibits edema.  Chronic edema BLE. Ambulates with walker.   Neurological: She is  alert. No cranial nerve deficit. She exhibits normal muscle tone. Coordination normal.  Oriented to person and place.   Skin: Skin is warm and dry.  Psychiatric: She has a normal mood and affect. Her behavior is normal.    Labs reviewed: Recent Labs    01/04/17 0000 02/04/17 0655 03/11/17 1000 04/02/17 04/22/17 04/26/17 09/09/17  NA 140 140 142 140 141  --  140  K 4.0 4.1 3.8 4.5 3.9  --  4.4  CL 104 104 104  --   --  100 103  CO2 27 32 29  --   --  35 33  GLUCOSE 89 92 107*  --   --   --   --   BUN 19 22 15 20 8   --  21  CREATININE 0.86 0.76 0.73 0.7 0.6  --  0.8  CALCIUM 9.8 9.8 9.8  --   --  10.2 10.0   Recent Labs    01/04/17 0000 02/04/17 0655  03/11/17 1000 04/02/17 04/22/17 04/26/17 09/09/17  AST 22 24  --  28 17 18   --  24  ALT 18 23  --  25 17 16   --  22  ALKPHOS  --   --    < > 71 65 77  --  67  BILITOT 0.7 0.5  --  1.0  --   --   --   --   PROT 6.1 6.1  --  6.2*  --   --  6.2 6.2  ALBUMIN  --   --   --  3.5  --   --  3.8 4.0   < > = values in this interval not displayed.   Recent Labs    01/04/17 0000 02/04/17 0655 03/11/17 1000 04/02/17 04/22/17 09/09/17  WBC 5.4 5.0 4.5 5.3 5.8 4.6  HGB 14.1 13.2 13.6 13.3 14.8 14.5  HCT 41.5 37.6 41.4 38 43 42  MCV 94.7 91.3 98.6  --   --   --   PLT 234 246 204 217 326 215   Lab Results  Component Value Date   TSH 0.99 09/09/2017   No results found for: HGBA1C Lab Results  Component Value Date   CHOL 150 02/04/2017   HDL 72 02/04/2017   LDLCALC 65 02/04/2017   TRIG 54 02/04/2017   CHOLHDL 2.1 02/04/2017    Significant Diagnostic Results in last 30 days:  No results found.  Assessment/Plan Constipation constipation, stable, continue MiraLax daily.   Gastroesophageal reflux disease without esophagitis GERD sable, continue Omeprazole 20mg  qd.  Depression with anxiety  Her mood is stable.  Continue Mirtazapine 7.5mg  qd  Urinary incontinence without sensory awareness  Urinary frequency, livable,  2-3x/night, continue  Mybetriq 25mg  qd, adult depends for urinary leakage.   CHF (congestive heart failure), NYHA class I, chronic, diastolic (HCC) CHF, compensated, continue  Furosemide 20mg  qd. Last Cardiology visit 11/30/17  HTN (hypertension) HTN, blood pressure is in controled, continue Diltiazem 120mg  qd, off Losartan   PAF (paroxysmal atrial fibrillation) (HCC) Afib, heart rate is in control, continue Diltiazem 120mg  qd. Eliquis 2.5mg  bid for thromboembolic risk reduction.   Dementia with behavioral disturbance She resides in [place] [facility] for safety and care assistance, continue  Donepezil 10mg  qd for memory.    Incontinent of urine Persists, dc Myrbetriq, no longer beneficial.   Chronic insomnia Difficulty falling asleep, stays up to 3-4am sometimes, she compensating her rest by napping during day. Will have Ambien 5mg  hs available to her. Observe for unwanted side effects.      Family/ staff Communication: plan of care reviewed with the patient and charge nurse.   Labs/tests ordered: none  Time spend 25 minutes.

## 2017-12-07 NOTE — Assessment & Plan Note (Signed)
Urinary frequency, livable, 2-3x/night, continue  Mybetriq 25mg  qd, adult depends for urinary leakage.

## 2017-12-07 NOTE — Assessment & Plan Note (Addendum)
HTN, blood pressure is in controled, continue Diltiazem 120mg  qd, off Losartan

## 2017-12-07 NOTE — Assessment & Plan Note (Addendum)
Her mood is stable.  Continue Mirtazapine 7.5mg  qd

## 2017-12-07 NOTE — Assessment & Plan Note (Signed)
Afib, heart rate is in control, continue Diltiazem 120mg  qd. Eliquis 2.5mg  bid for thromboembolic risk reduction.

## 2017-12-07 NOTE — Assessment & Plan Note (Signed)
She resides in [place] for safety and care assistance, continue  Donepezil 10mg  qd for memory.

## 2017-12-07 NOTE — Assessment & Plan Note (Signed)
GERD sable, continue Omeprazole 20mg  qd.

## 2017-12-07 NOTE — Assessment & Plan Note (Addendum)
CHF, compensated, continue  Furosemide 20mg  qd. Last Cardiology visit 11/30/17

## 2017-12-08 ENCOUNTER — Encounter: Payer: Self-pay | Admitting: Nurse Practitioner

## 2018-03-25 ENCOUNTER — Encounter: Payer: Self-pay | Admitting: *Deleted

## 2018-04-01 ENCOUNTER — Encounter: Payer: Self-pay | Admitting: Nurse Practitioner

## 2018-04-01 ENCOUNTER — Non-Acute Institutional Stay: Payer: Medicare Other | Admitting: Nurse Practitioner

## 2018-04-01 DIAGNOSIS — I5032 Chronic diastolic (congestive) heart failure: Secondary | ICD-10-CM | POA: Diagnosis not present

## 2018-04-01 DIAGNOSIS — C480 Malignant neoplasm of retroperitoneum: Secondary | ICD-10-CM

## 2018-04-01 DIAGNOSIS — I10 Essential (primary) hypertension: Secondary | ICD-10-CM | POA: Diagnosis not present

## 2018-04-01 DIAGNOSIS — K5901 Slow transit constipation: Secondary | ICD-10-CM

## 2018-04-01 DIAGNOSIS — K219 Gastro-esophageal reflux disease without esophagitis: Secondary | ICD-10-CM

## 2018-04-01 DIAGNOSIS — I480 Paroxysmal atrial fibrillation: Secondary | ICD-10-CM

## 2018-04-01 DIAGNOSIS — F5104 Psychophysiologic insomnia: Secondary | ICD-10-CM

## 2018-04-01 DIAGNOSIS — F418 Other specified anxiety disorders: Secondary | ICD-10-CM | POA: Diagnosis not present

## 2018-04-01 DIAGNOSIS — F0391 Unspecified dementia with behavioral disturbance: Secondary | ICD-10-CM

## 2018-04-01 NOTE — Progress Notes (Signed)
Location:  [facility] Room Number: 741 Place of Service:  ALF [phone]) Provider:  Marlana Latus  NP  Karter Haire X, NP  Patient Care Team: Toshie Demelo X, NP as PCP - General (Internal Medicine) Nahser, Wonda Cheng, MD as PCP - Cardiology (Cardiology) Adrionna Delcid X, NP as Nurse Practitioner (Nurse Practitioner) Bo Merino, MD as Consulting Physician (Rheumatology) Curly Rim, MD as Consulting Physician (Internal Medicine) Ernst Spell, MD as Referring Physician (Urology) Charlotte Crumb, MD as Consulting Physician (Orthopedic Surgery) Festus Aloe, MD as Consulting Physician (Urology)  Extended Emergency Contact Information Primary Emergency Contact: [facility] Address: [address]          [place], [place] [address] [name] of [place] Phone: [phone] [place] Phone: [phone] Relation: Son Secondary Emergency Contact: Alessandra Grout States of [place] Phone: [phone] Relation: Other  Code Status:  Full Code Goals of care: Advanced Directive information Advanced Directives 04/01/2018  Does Patient Have a Medical Advance Directive? Yes  Type of Advance Directive [place]  Does patient want to make changes to medical advance directive? No - Patient declined  Copy of [place] in Chart? Yes - validated most recent copy scanned in chart (See row information)  Would patient like information on creating a medical advance directive? -     Chief Complaint  Patient presents with  . Medical Management of Chronic Issues    HPI:  Pt is a 85 y.o. female seen today for medical management of chronic diseases.     The patient resides in [place] for safety and care assistance, taking Donepezil 59m qd for memory. Her mood is not stable as evidence in episodes of angry, agitation, irritability. On Mirtazapine 7.560mqd, prn Zolpidem 56m12mor sleep, but she stated she still has  troubling falling asleep.  Her weights remains no change comparing with 03/2017. No constipation, on MiraLax qd. GERD stable on Omeprazole 42m47m. CHF, stable on Furosemide 42mg66m Afib, heart rate is in control, on Diltiazem 142mg 83mEliquis 2.56mg bi21m   Past Medical History:  Diagnosis Date  . Abnormality of gait 12/30/2015  . Arthritis   . Asthma   . Atrial fibrillation (HCC)   [place] (divert[place] disease) 10/26/2013  . 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 Diverticulitis   . Edema 12/30/2015  . Gout   . Gout 12/30/2015  . History of CVA (cerebrovascular accident)    Left basal ganglia   . Hyperlipemia   . Hypertension   . Liposarcoma (HCC) 11[place]/2017   excised 12/20/15  . Mitral valve regurgitation   . Mitral valve regurgitation 12/30/2015  . OP (osteoporosis) 10/26/2013  . PAF (paroxysmal atrial fibrillation) (HCC) 6/[place]2015  . Pseudogout of knee 07/28/2013   03/16/13 Uric acid 5.5   . Spondylosis of cervical joint 12/30/2015  . Spondylosis of cervical spine   . TI (tricuspid incompetence) 05/18/2011   Overview:  Moderate severe   . Unspecified vitamin D deficiency 07/28/2013   03/16/13 Vit D 24.6   . Urinary incontinence without sensory awareness 07/28/2013  . Venous (peripheral) insufficiency 02/22/2014   Past Surgical History:  Procedure Laterality Date  . COLONOSCOPY  2011   Dr. RadioncCindie LarocherJule Sermove fatty tumor  2017  . THUMB ARTHROSCOPY Right 2014   Dr. WeingolBurney GauzeSILLECTOMY  1938    Allergies  Allergen Reactions  . Ace Inhibitors Cough    Cough  . Codeine  Nausea And Vomiting  . Penicillins Hives and Rash  . Sulfa Antibiotics Hives and Rash    Outpatient Encounter Medications as of 04/01/2018  Medication Sig  . acetaminophen (TYLENOL) 500 MG tablet Take 500 mg by mouth daily as needed.  [place] Kitchen albuterol (PROVENTIL HFA;VENTOLIN HFA) 108 (90 Base) MCG/ACT inhaler Inhale 2 puffs into the lungs every 6 (six) hours as needed for wheezing or  shortness of breath.  [place] Kitchen apixaban (ELIQUIS) 2.5 MG TABS tablet Take 1 tablet (2.5 mg total) by mouth 2 (two) times daily.  [place] Kitchen diltiazem (CARDIZEM) 120 MG tablet Take 120 mg by mouth daily.  [place] Kitchen donepezil (ARICEPT) 10 MG tablet Take 10 mg by mouth at bedtime.  . Flavoring Agent (WILD CHERRY FLAVOR) LIQD 500 mg by Does not apply route daily.  . furosemide (LASIX) 20 MG tablet Take 20 mg by mouth. ON Monday, Wednesday AND FRIDAY  . Melatonin 3 MG TABS Take 3 mg by mouth at bedtime.  . mirtazapine (REMERON) 7.5 MG tablet Take 7.5 mg by mouth at bedtime.  . Multiple Vitamins-Minerals (CENTRUM SILVER ADULT 50+ PO) Take 1 tablet by mouth every morning.   . Omega-3 Fatty Acids (FISH OIL PO) Take 1 capsule by mouth daily.  [place] Kitchen omeprazole (PRILOSEC) 20 MG capsule Take 20 mg by mouth daily. Give 30 minutes prior to meal.  . OXYGEN Inhale 2 L into the lungs daily as needed. Shortness of breath: Obtain a full set of vital signs with O2 sat, and auscultate lungs. Start O2 @ 2 l/m via nasal cannula for SOB or sat<90% on room air. Reassess O2 sat with 15 minutes. Notify provider if sat does not improve within 30 minutes.  . polyethylene glycol (MIRALAX / GLYCOLAX) packet Take 17 g by mouth daily.  [place] Kitchen zolpidem (AMBIEN) 5 MG tablet Take 5 mg by mouth at bedtime as needed for sleep.  . [DISCONTINUED] mirabegron ER (MYRBETRIQ) 25 MG TB24 tablet Take 25 mg by mouth daily.  . [DISCONTINUED] Misc Natural Products (BLACK CHERRY CONCENTRATE PO) Take 2 tablets by mouth daily.   No facility-administered encounter medications on file as of 04/01/2018.    ROS was provided with assistance of staff Review of Systems  Constitutional: Negative for activity change, appetite change, chills, diaphoresis, fatigue, fever and unexpected weight change.       No weight change comparing a year ago.   HENT: Positive for hearing loss. Negative for congestion and voice change.   Respiratory: Negative for cough, shortness of breath and wheezing.    Cardiovascular: Positive for leg swelling. Negative for chest pain and palpitations.  Gastrointestinal: Negative for abdominal distention, abdominal pain, constipation, diarrhea, nausea and vomiting.  Genitourinary: Negative for difficulty urinating, dysuria and urgency.       Incontinent of urine.   Musculoskeletal: Positive for arthralgias and gait problem.  Skin: Positive for color change.       Pigmented skin changes BLE  Neurological: Negative for dizziness, tremors, speech difficulty and headaches.       Memory lapses.   Psychiatric/Behavioral: Positive for agitation, behavioral problems and sleep disturbance. Negative for hallucinations. The patient is nervous/anxious.     Immunization History  Administered Date(s) Administered  . Influenza Split 11/07/2009  . Influenza Whole 11/11/2017  . Influenza, High Dose Seasonal PF 01/10/2016, 11/18/2016  . Influenza,inj,quad, With Preservative 01/10/2016  . Influenza-Unspecified 11/09/2012, 11/27/2013, 11/08/2014, 11/21/2015  . Pneumococcal Conjugate-13 01/10/2016  . Pneumococcal Polysaccharide-23 02/09/2002  . Td 08/10/2003  . Tdap 08/06/2013  . Zoster 03/12/2005  Pertinent  Health Maintenance Due  Topic Date Due  . INFLUENZA VACCINE  Completed  . DEXA SCAN  Completed  . PNA vac Low Risk Adult  Completed   Fall Risk  11/05/2017 01/07/2017 11/18/2015 06/06/2015 06/06/2015  Falls in the past year? No No No No No  Number falls in past yr: - - - - -   Functional Status Survey:    Vitals:   04/01/18 1119  BP: 136/70  Pulse: 74  Resp: 18  Temp: (!) 97.3 F (36.3 C)  SpO2: 93%  Weight: 144 lb 12.8 oz (65.7 kg)  Height: 5' 4" (1.626 m)   Body mass index is 24.85 kg/m. Physical Exam Constitutional:      General: She is not in acute distress.    Appearance: Normal appearance. She is normal weight. She is not ill-appearing, toxic-appearing or diaphoretic.  HENT:     Head: Normocephalic and atraumatic.     Nose: Nose  normal.     Mouth/Throat:     Mouth: Mucous membranes are moist.  Eyes:     Extraocular Movements: Extraocular movements intact.     Pupils: Pupils are equal, round, and reactive to light.  Neck:     Musculoskeletal: Normal range of motion and neck supple.  Cardiovascular:     Rate and Rhythm: Normal rate.     Heart sounds: No murmur.  Pulmonary:     Effort: Pulmonary effort is normal.     Breath sounds: No wheezing, rhonchi or rales.  Abdominal:     Palpations: There is mass.     Tenderness: There is no abdominal tenderness. There is no guarding or rebound.  Musculoskeletal:     Right lower leg: Edema present.     Left lower leg: Edema present.     Comments: Trace edema BLE. Ambulates with walker.   Skin:    General: Skin is warm and dry.     Comments: Pigmented BLE.  Neurological:     General: No focal deficit present.     Mental Status: She is alert. Mental status is at baseline.     Cranial Nerves: No cranial nerve deficit.     Motor: No weakness.     Coordination: Coordination normal.     Gait: Gait abnormal.     Comments: Oriented to person and place.   Psychiatric:        Mood and Affect: Mood normal.     Labs reviewed: Recent Labs    04/22/17 04/26/17 09/09/17  NA 141  --  140  K 3.9  --  4.4  CL  --  100 103  CO2  --  35 33  BUN 8  --  21  CREATININE 0.6  --  0.8  CALCIUM  --  10.2 10.0   Recent Labs    04/22/17 04/26/17 09/09/17  AST 18  --  24  ALT 16  --  22  ALKPHOS 77  --  67  PROT  --  6.2 6.2  ALBUMIN  --  3.8 4.0   Recent Labs    04/22/17 09/09/17  WBC 5.8 4.6  HGB 14.8 14.5  HCT 43 42  PLT 326 215   Lab Results  Component Value Date   TSH 0.99 09/09/2017   No results found for: HGBA1C Lab Results  Component Value Date   CHOL 150 02/04/2017   HDL 72 02/04/2017   LDLCALC 65 02/04/2017   TRIG 54 02/04/2017   CHOLHDL 2.1 02/04/2017      Significant Diagnostic Results in last 30 days:  No results  found.  Assessment/Plan Depression with anxiety Mood is not stable, psychiatric clinical nurse specialist 03/31/18 adding Depakote 1655m bid, f/u in 1 month. Continue Mirtazapine 7.529mqd, Ambien 55m51ms prn. Will update CBC, CMP/eGFR. Observe.   CHF (congestive heart failure), NYHA class I, chronic, diastolic (HCC[place]rolltonompensated clinically, last Cardiology 11/30/17, continue Furosemide 25m86m.   HTN (hypertension) Blood pressure is controlled.   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 Too risky for surgery, supportive care vs palliative XRT, s/p radiation oncology consultation.  [facility]04[facility]    Constipation Stable, continue MiraLax qd.   GERD (gastroesophageal reflux disease) Stable, continue Omeprazole 25mg qd43mChronic insomnia Difficulty falling a sleep, adding Depakote may be helpful, continue Mirtazapine 7.55mg qhs,101mn Ambien 55mg qd. [id number].  '   Family/ staff Communication: plan of care reviewed with the patient and charge nurse.   Labs/tests ordered:  CBC CMP/eGFR  Time spend 25 minutes

## 2018-04-01 NOTE — Assessment & Plan Note (Signed)
Stable, continue Omeprazole 20mg qd.  

## 2018-04-01 NOTE — Assessment & Plan Note (Signed)
Blood pressure is controlled

## 2018-04-01 NOTE — Assessment & Plan Note (Signed)
Stable, continue MiraLax qd.  

## 2018-04-01 NOTE — Assessment & Plan Note (Addendum)
01/14/18 CT abd pelvis w CM: significant enlargement of the known low attenuation mass in the right hemi abdomen. Too risky for surgery, supportive care vs palliative XRT, s/p radiation oncology consultation.  04/04/18 f/u Surgeon at [facility] medical center.

## 2018-04-01 NOTE — Assessment & Plan Note (Signed)
Heart rate is in control, f/u cardiology, continue Diltiazem 120mg  qd, Eliquis 2.5mg  bid.

## 2018-04-01 NOTE — Assessment & Plan Note (Signed)
Continue [facility] for safety and care assistance, last MMSE 26/30 03/24/18, continue Donepezil 10mg  qd.

## 2018-04-01 NOTE — Assessment & Plan Note (Signed)
Compensated clinically, last Cardiology 11/30/17, continue Furosemide 20mg  qd.

## 2018-04-01 NOTE — Assessment & Plan Note (Signed)
Difficulty falling a sleep, adding Depakote may be helpful, continue Mirtazapine 7.5mg  qhs, prn Ambien 5mg  qd. Observe.

## 2018-04-01 NOTE — Assessment & Plan Note (Signed)
Mood is not stable, psychiatric clinical nurse specialist 03/31/18 adding Depakote 134m bid, f/u in 1 month. Continue Mirtazapine 7.524mqd, Ambien 77m31ms prn. Will update CBC, CMP/eGFR. Observe.

## 2018-05-02 ENCOUNTER — Other Ambulatory Visit: Payer: Self-pay | Admitting: *Deleted

## 2018-05-02 MED ORDER — ZOLPIDEM TARTRATE 5 MG PO TABS: 5.0000 mg | ORAL_TABLET | Freq: Every evening | ORAL | 0 refills | Status: DC | PRN

## 2018-05-04 ENCOUNTER — Other Ambulatory Visit: Payer: Self-pay | Admitting: Nurse Practitioner

## 2018-05-11 ENCOUNTER — Other Ambulatory Visit: Payer: Self-pay

## 2018-05-11 MED ORDER — ZOLPIDEM TARTRATE 5 MG PO TABS: 5.0000 mg | ORAL_TABLET | Freq: Every evening | ORAL | 0 refills | Status: DC | PRN

## 2018-05-11 NOTE — Telephone Encounter (Signed)
Received fax from [facility] requesting a refill on Ambien.No access to [place] Database

## 2018-05-23 ENCOUNTER — Non-Acute Institutional Stay: Payer: Medicare Other | Admitting: Nurse Practitioner

## 2018-05-23 ENCOUNTER — Encounter: Payer: Self-pay | Admitting: Nurse Practitioner

## 2018-05-23 DIAGNOSIS — K5901 Slow transit constipation: Secondary | ICD-10-CM

## 2018-05-23 DIAGNOSIS — R531 Weakness: Secondary | ICD-10-CM | POA: Diagnosis not present

## 2018-05-23 DIAGNOSIS — R0902 Hypoxemia: Secondary | ICD-10-CM

## 2018-05-23 DIAGNOSIS — C480 Malignant neoplasm of retroperitoneum: Secondary | ICD-10-CM

## 2018-05-23 NOTE — Assessment & Plan Note (Signed)
01/14/18 CT abd pelvis w CM: significant enlargement of the known low attenuation mass in the right hemi abdomen. Too risky for surgery, supportive care vs palliative XRT, pending radiation oncology consultation. Opted out surgical intervention. May consider Hospice referral.

## 2018-05-23 NOTE — Assessment & Plan Note (Signed)
Continue MIraLax qd, adding Senokot S I qhs, hold for loose stools.

## 2018-05-23 NOTE — Assessment & Plan Note (Signed)
Continue O2 2lpm to maintain Sat O2 >89%. Obtain CXR to evaluate further.

## 2018-05-23 NOTE — Progress Notes (Signed)
Location:  [place] Room Number: 468 Place of Service:  SNF (31) Provider:  , Lennie Odor  NP  ,  X, NP  Patient Care Team: ,  X, NP as PCP - General (Internal Medicine) Nahser, Wonda Cheng, MD as PCP - Cardiology (Cardiology) ,  X, NP as Nurse Practitioner (Nurse Practitioner) Bo Merino, MD as Consulting Physician (Rheumatology) Curly Rim, MD as Consulting Physician (Internal Medicine) Ernst Spell, MD as Referring Physician (Urology) Charlotte Crumb, MD as Consulting Physician (Orthopedic Surgery) Festus Aloe, MD as Consulting Physician (Urology)  Extended Emergency Contact Information Primary Emergency Contact: [facility] Address: [address]          [place], [place] [address] [name] of [place] Phone: [phone] Mobile Phone: [phone] Relation: Son Secondary Emergency Contact: Alessandra Grout [place] of [place] Phone: [phone] Relation: Other  Code Status:  Full Code Goals of care: Advanced Directive information Advanced Directives 05/23/2018  Does Patient Have a Medical Advance Directive? Yes  Type of Advance Directive [place]  Does patient want to make changes to medical advance directive? No - Patient declined  Copy of [place] in Chart? Yes - validated most recent copy scanned in chart (See row information)  Would patient like information on creating a medical advance directive? -     Chief Complaint  Patient presents with  . Acute Visit    C/o- generalized weakness    HPI:  Pt is a 85 y.o. female seen today for an acute visit for generalized weakness, gradual onset, she is seen in bed, admitted some discomfort in abd, constipation, last BM yesterday. She has O2 desaturation, DOE,  now on O2 2lpm via [place]. She denied cough, chest pain/pressures, palpitation. Hx of retroperitoneal liposarcoma, opted out surgery,  01/14/18 CT abd pelvis w CM: significant enlargement of the known low attenuation mass in the right hemi abdomen. Too risky for surgery, supportive care vs palliative XRT, pending radiation oncology consultation.   Past Medical History:  Diagnosis Date  . Abnormality of gait 12/30/2015  . Arthritis   . Asthma   . Atrial fibrillation ([place])   . DD (diverticular disease) 10/26/2013  . 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 Diverticulitis   . Edema 12/30/2015  . Gout   . Gout 12/30/2015  . History of CVA (cerebrovascular accident)    Left basal ganglia   . Hyperlipemia   . Hypertension   . Liposarcoma ([place]) 12/20/2015   excised 12/20/15  . Mitral valve regurgitation   . Mitral valve regurgitation 12/30/2015  . OP (osteoporosis) 10/26/2013  . PAF (paroxysmal atrial fibrillation) ([place]) 07/28/2013  . Pseudogout of knee 07/28/2013   03/16/13 Uric acid 5.5   . Spondylosis of cervical joint 12/30/2015  . Spondylosis of cervical spine   . TI (tricuspid incompetence) 05/18/2011   Overview:  Moderate severe   . Unspecified vitamin D deficiency 07/28/2013   03/16/13 Vit D 24.6   . Urinary incontinence without sensory awareness 07/28/2013  . Venous (peripheral) insufficiency 02/22/2014   Past Surgical History:  Procedure Laterality Date  . COLONOSCOPY  2011   Dr. Cindie Laroche Jule Ser)  . remove fatty tumor  2017  . THUMB ARTHROSCOPY Right 2014   Dr. Burney Gauze  . TONSILLECTOMY  1938    Allergies  Allergen Reactions  . Ace Inhibitors Cough    Cough  . Codeine Nausea And Vomiting  . Penicillins Hives and Rash  . Sulfa Antibiotics Hives and  Rash    Outpatient Encounter Medications as of 05/23/2018  Medication Sig  . acetaminophen (TYLENOL) 500 MG tablet Take 500 mg by mouth daily as needed.  Marland Kitchen albuterol (PROVENTIL HFA;VENTOLIN HFA) 108 (90 Base) MCG/ACT inhaler Inhale 2 puffs into the lungs every 6 (six) hours as needed for wheezing or shortness of breath.  Marland Kitchen apixaban (ELIQUIS) 2.5 MG  TABS tablet Take 1 tablet (2.5 mg total) by mouth 2 (two) times daily.  Marland Kitchen diltiazem (CARDIZEM) 120 MG tablet Take 120 mg by mouth daily.  . divalproex (DEPAKOTE) 125 MG DR tablet Take 125 mg by mouth 2 (two) times daily.  Marland Kitchen donepezil (ARICEPT) 10 MG tablet Take 10 mg by mouth daily.   . Flavoring Agent (WILD CHERRY FLAVOR) LIQD 500 mg by Does not apply route daily.  . furosemide (LASIX) 20 MG tablet Take 20 mg by mouth. ON Monday, Wednesday AND FRIDAY  . Melatonin 3 MG TABS Take 3 mg by mouth at bedtime.  . mirtazapine (REMERON) 7.5 MG tablet Take 7.5 mg by mouth at bedtime.  . Multiple Vitamins-Minerals (CENTRUM SILVER ADULT 50+ PO) Take 1 tablet by mouth every morning.   . Omega-3 Fatty Acids (FISH OIL PO) Take 1 capsule by mouth daily.  Marland Kitchen omeprazole (PRILOSEC) 20 MG capsule Take 20 mg by mouth daily. Give 30 minutes prior to meal.  . OXYGEN Inhale 2 L into the lungs daily as needed. Shortness of breath: Obtain a full set of vital signs with O2 sat, and auscultate lungs. Start O2 @ 2 l/m via nasal cannula for SOB or sat<90% on room air. Reassess O2 sat with 15 minutes. Notify provider if sat does not improve within 30 minutes.  . polyethylene glycol (MIRALAX / GLYCOLAX) packet Take 17 g by mouth daily.  Marland Kitchen zolpidem (AMBIEN) 5 MG tablet Take 1 tablet (5 mg total) by mouth at bedtime as needed. for insomnia   No facility-administered encounter medications on file as of 05/23/2018.    ROS was provided with assistance of staff Review of Systems  Constitutional: Positive for activity change, appetite change and fatigue. Negative for chills, diaphoresis and fever.  HENT: Positive for hearing loss. Negative for congestion and voice change.   Eyes: Negative for visual disturbance.  Respiratory: Positive for shortness of breath. Negative for cough and wheezing.   Cardiovascular: Positive for leg swelling. Negative for chest pain and palpitations.  Gastrointestinal: Positive for abdominal pain and  constipation. Negative for abdominal distention, diarrhea, nausea and vomiting.  Genitourinary: Negative for difficulty urinating, dysuria and urgency.  Musculoskeletal: Positive for gait problem.  Skin: Negative for color change and pallor.  Neurological: Negative for dizziness, speech difficulty, weakness and headaches.       Memory lapses.   Psychiatric/Behavioral: Negative for agitation, behavioral problems, hallucinations and sleep disturbance. The patient is not nervous/anxious.     Immunization History  Administered Date(s) Administered  . Influenza Split 11/07/2009  . Influenza Whole 11/11/2017  . Influenza, High Dose Seasonal PF 01/10/2016, 11/18/2016  . Influenza,inj,quad, With Preservative 01/10/2016  . Influenza-Unspecified 11/09/2012, 11/27/2013, 11/08/2014, 11/21/2015  . Pneumococcal Conjugate-13 01/10/2016  . Pneumococcal Polysaccharide-23 02/09/2002  . Td 08/10/2003  . Tdap 08/06/2013  . Zoster 03/12/2005   Pertinent  Health Maintenance Due  Topic Date Due  . INFLUENZA VACCINE  09/10/2018  . DEXA SCAN  Completed  . PNA vac Low Risk Adult  Completed   Fall Risk  11/05/2017 01/07/2017 11/18/2015 06/06/2015 06/06/2015  Falls in the past year? No No No  No No  Number falls in past yr: - - - - -   Functional Status Survey:    Vitals:   05/23/18 1122  BP: (!) 144/70  Pulse: 72  Resp: (!) 24  Temp: 98.6 F (37 C)  SpO2: 93%  Weight: 147 lb 6.4 oz (66.9 kg)  Height: 5' 2"  (1.575 m)   Body mass index is 26.96 kg/m. Physical Exam Constitutional:      General: She is not in acute distress.    Appearance: Normal appearance. She is not ill-appearing, toxic-appearing or diaphoretic.  HENT:     Head: Normocephalic and atraumatic.     Nose: Nose normal.     Mouth/Throat:     Mouth: Mucous membranes are moist.  Eyes:     Extraocular Movements: Extraocular movements intact.     Conjunctiva/sclera: Conjunctivae normal.     Pupils: Pupils are equal, round, and  reactive to light.  Neck:     Musculoskeletal: Normal range of motion and neck supple.  Cardiovascular:     Rate and Rhythm: Normal rate and regular rhythm.     Heart sounds: No murmur.  Pulmonary:     Breath sounds: No wheezing or rales.  Abdominal:     General: There is no distension.     Palpations: Abdomen is soft. There is mass.     Tenderness: There is no abdominal tenderness. There is no guarding or rebound.     Hernia: No hernia is present.     Comments: Stated its not comfortable when palpate.   Musculoskeletal:     Right lower leg: Edema present.     Left lower leg: Edema present.     Comments: Trace edema BLE  Skin:    General: Skin is warm and dry.  Neurological:     General: No focal deficit present.     Mental Status: She is alert. Mental status is at baseline.     Cranial Nerves: No cranial nerve deficit.     Motor: No weakness.     Coordination: Coordination normal.     Gait: Gait abnormal.     Comments: Oriented to person and place.   Psychiatric:        Mood and Affect: Mood normal.        Behavior: Behavior normal.     Labs reviewed: Recent Labs    09/09/17  NA 140  K 4.4  CL 103  CO2 33  BUN 21  CREATININE 0.8  CALCIUM 10.0   Recent Labs    09/09/17  AST 24  ALT 22  ALKPHOS 67  PROT 6.2  ALBUMIN 4.0   Recent Labs    09/09/17  WBC 4.6  HGB 14.5  HCT 42  PLT 215   Lab Results  Component Value Date   TSH 0.99 09/09/2017   No results found for: HGBA1C Lab Results  Component Value Date   CHOL 150 02/04/2017   HDL 72 02/04/2017   LDLCALC 65 02/04/2017   TRIG 54 02/04/2017   CHOLHDL 2.1 02/04/2017    Significant Diagnostic Results in last 30 days:  No results found.  Assessment/Plan Retroperitoneal liposarcoma ([place]) 01/14/18 CT abd pelvis w CM: significant enlargement of the known low attenuation mass in the right hemi abdomen. Too risky for surgery, supportive care vs palliative XRT, pending radiation oncology consultation.  Opted out surgical intervention. May consider Hospice referral.   Constipation Continue MIraLax qd, adding Senokot S I qhs, hold for loose stools.  Generalized weakness Supportive measures for now, update CBC/diff, CMP/eGFR, TSH  Hypoxemia Continue O2 2lpm to maintain Sat O2 >89%. Obtain CXR to evaluate further.      Family/ staff Communication: plan of care reviewed with the patient and charge nurse.   Labs/tests ordered:  CBC/diff, CMP/eGFR, TSH, CXR ap  Time spend 25 minutes.

## 2018-05-23 NOTE — Assessment & Plan Note (Signed)
Supportive measures for now, update CBC/diff, CMP/eGFR, TSH

## 2018-05-27 ENCOUNTER — Non-Acute Institutional Stay: Payer: Medicare Other | Admitting: Nurse Practitioner

## 2018-05-27 ENCOUNTER — Encounter: Payer: Self-pay | Admitting: Nurse Practitioner

## 2018-05-27 DIAGNOSIS — Z7189 Other specified counseling: Secondary | ICD-10-CM | POA: Insufficient documentation

## 2018-05-27 DIAGNOSIS — F0391 Unspecified dementia with behavioral disturbance: Secondary | ICD-10-CM

## 2018-05-27 DIAGNOSIS — K219 Gastro-esophageal reflux disease without esophagitis: Secondary | ICD-10-CM

## 2018-05-27 DIAGNOSIS — I10 Essential (primary) hypertension: Secondary | ICD-10-CM

## 2018-05-27 DIAGNOSIS — K5901 Slow transit constipation: Secondary | ICD-10-CM | POA: Diagnosis not present

## 2018-05-27 DIAGNOSIS — I480 Paroxysmal atrial fibrillation: Secondary | ICD-10-CM

## 2018-05-27 DIAGNOSIS — C480 Malignant neoplasm of retroperitoneum: Secondary | ICD-10-CM

## 2018-05-27 DIAGNOSIS — I5032 Chronic diastolic (congestive) heart failure: Secondary | ICD-10-CM

## 2018-05-27 NOTE — Assessment & Plan Note (Signed)
Blood pressure is controlled, continue Diltiazem 120mg  qd.

## 2018-05-27 NOTE — Assessment & Plan Note (Signed)
Stable, continue Mirtazapine 7.5mg  qd, prn Ambien 5mg  hs prn.

## 2018-05-27 NOTE — Progress Notes (Signed)
Location:  [place] Room Number: 629 Place of Service:  ALF [phone]) Provider:  Marlana Latus  NP  Tatym Schermer X, NP  Patient Care Team: Madlyn Crosby X, NP as PCP - General (Internal Medicine) Nahser, Wonda Cheng, MD as PCP - Cardiology (Cardiology) Daily Crate X, NP as Nurse Practitioner (Nurse Practitioner) Bo Merino, MD as Consulting Physician (Rheumatology) Curly Rim, MD as Consulting Physician (Internal Medicine) Ernst Spell, MD as Referring Physician (Urology) Charlotte Crumb, MD as Consulting Physician (Orthopedic Surgery) Festus Aloe, MD as Consulting Physician (Urology)  Extended Emergency Contact Information Primary Emergency Contact: [facility] Address: [address]          [place], [place] [address] [name] of [place] Phone: [phone] Mobile Phone: [phone] Relation: Son Secondary Emergency Contact: Alessandra Grout States of [place] Phone: [phone] Relation: Other  Code Status: Full Code Goals of care: Advanced Directive information Advanced Directives 05/23/2018  Does Patient Have a Medical Advance Directive? Yes  Type of Advance Directive [place]  Does patient want to make changes to medical advance directive? No - Patient declined  Copy of [place] in Chart? Yes - validated most recent copy scanned in chart (See row information)  Would patient like information on creating a medical advance directive? -     Chief Complaint  Patient presents with  . Acute Visit    ACP visit   . Medical Management of Chronic Issues    HPI:  Pt is a 85 y.o. female seen today for medical management of chronic diseases and advanced care planing/counseling discussion.   The patient resides in [place] for safety and care assistance. The patient has history of retroperitoneal liposarcoma, significant enlargement, opted out surgery, discomfort abd sometimes. Hx  of insomnia, takes Ambien 5mg  qd prn, Mirtazapine 7.5mg  qd. Constipation is stable on Senokot S I qhs, MiraLax qd. GERD, stable on Omeprazole 20mg  qd. CHF, compensated clinically on Furosemide 20mg  qd, O2 dependent. Her memory is preserved on Donepezil 10mg  qd, Depakote 125mg  bid. Afib, heart rate is in control, on Diltiazem 120mg  qd, Eliquis 2.5mg  bid.    Past Medical History:  Diagnosis Date  . Abnormality of gait 12/30/2015  . Arthritis   . Asthma   . Atrial fibrillation ([place])   . DD (diverticular disease) 10/26/2013  . 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 Diverticulitis   . Edema 12/30/2015  . Gout   . Gout 12/30/2015  . History of CVA (cerebrovascular accident)    Left basal ganglia   . Hyperlipemia   . Hypertension   . Liposarcoma ([place]) 12/20/2015   excised 12/20/15  . Mitral valve regurgitation   . Mitral valve regurgitation 12/30/2015  . OP (osteoporosis) 10/26/2013  . PAF (paroxysmal atrial fibrillation) ([place]) 07/28/2013  . Pseudogout of knee 07/28/2013   03/16/13 Uric acid 5.5   . Spondylosis of cervical joint 12/30/2015  . Spondylosis of cervical spine   . TI (tricuspid incompetence) 05/18/2011   Overview:  Moderate severe   . Unspecified vitamin D deficiency 07/28/2013   03/16/13 Vit D 24.6   . Urinary incontinence without sensory awareness 07/28/2013  . Venous (peripheral) insufficiency 02/22/2014   Past Surgical History:  Procedure Laterality Date  . COLONOSCOPY  2011   Dr. Cindie Laroche Jule Ser)  . remove fatty tumor  2017  . THUMB ARTHROSCOPY Right 2014   Dr. Burney Gauze  . TONSILLECTOMY  1938    Allergies  Allergen Reactions  .  Ace Inhibitors Cough    Cough  . Codeine Nausea And Vomiting  . Penicillins Hives and Rash  . Sulfa Antibiotics Hives and Rash    Outpatient Encounter Medications as of 05/27/2018  Medication Sig  . acetaminophen (TYLENOL) 500 MG tablet Take 500 mg by mouth daily as needed.  Marland Kitchen albuterol (PROVENTIL HFA;VENTOLIN HFA) 108 (90 Base)  MCG/ACT inhaler Inhale 2 puffs into the lungs every 6 (six) hours as needed for wheezing or shortness of breath.  Marland Kitchen apixaban (ELIQUIS) 2.5 MG TABS tablet Take 1 tablet (2.5 mg total) by mouth 2 (two) times daily.  Marland Kitchen diltiazem (CARDIZEM) 120 MG tablet Take 120 mg by mouth daily.  . divalproex (DEPAKOTE) 125 MG DR tablet Take 125 mg by mouth 2 (two) times daily.  Marland Kitchen donepezil (ARICEPT) 10 MG tablet Take 10 mg by mouth daily.   . Flavoring Agent (WILD CHERRY FLAVOR) LIQD 500 mg by Does not apply route daily.  . furosemide (LASIX) 20 MG tablet Take 20 mg by mouth. ON Monday, Wednesday AND FRIDAY  . Melatonin 3 MG TABS Take 3 mg by mouth at bedtime.  . mirtazapine (REMERON) 7.5 MG tablet Take 7.5 mg by mouth at bedtime.  . Multiple Vitamins-Minerals (CENTRUM SILVER ADULT 50+ PO) Take 1 tablet by mouth every morning.   . Omega-3 Fatty Acids (FISH OIL PO) Take 1 capsule by mouth daily.  Marland Kitchen omeprazole (PRILOSEC) 20 MG capsule Take 20 mg by mouth daily. Give 30 minutes prior to meal.  . OXYGEN Inhale 2 L into the lungs daily as needed. Shortness of breath: Obtain a full set of vital signs with O2 sat, and auscultate lungs. Start O2 @ 2 l/m via nasal cannula for SOB or sat<90% on room air. Reassess O2 sat with 15 minutes. Notify provider if sat does not improve within 30 minutes.  . polyethylene glycol (MIRALAX / GLYCOLAX) packet Take 17 g by mouth daily.  . sennosides-docusate sodium (SENOKOT-S) 8.6-50 MG tablet Take 1 tablet by mouth at bedtime.  Marland Kitchen zolpidem (AMBIEN) 5 MG tablet Take 1 tablet (5 mg total) by mouth at bedtime as needed. for insomnia   No facility-administered encounter medications on file as of 05/27/2018.     Review of Systems  Constitutional: Positive for fatigue. Negative for activity change, appetite change, chills, diaphoresis, fever and unexpected weight change.  HENT: Positive for hearing loss. Negative for congestion and voice change.   Eyes: Negative for visual disturbance.   Respiratory: Positive for shortness of breath. Negative for cough and wheezing.        DOE, O2 dependent.   Cardiovascular: Positive for leg swelling. Negative for chest pain and palpitations.  Gastrointestinal: Positive for abdominal pain. Negative for abdominal distention, blood in stool, constipation, diarrhea, nausea and vomiting.       Discomfort abd palpated.   Genitourinary: Negative for difficulty urinating, dysuria and urgency.  Musculoskeletal: Positive for gait problem.  Skin: Negative for color change and pallor.       Chronic brownish pigmented skin change BLE  Neurological: Negative for dizziness, speech difficulty, weakness and headaches.       Memory lapses.   Psychiatric/Behavioral: Negative for agitation, behavioral problems, hallucinations and sleep disturbance. The patient is not nervous/anxious.     Immunization History  Administered Date(s) Administered  . Influenza Split 11/07/2009  . Influenza Whole 11/11/2017  . Influenza, High Dose Seasonal PF 01/10/2016, 11/18/2016  . Influenza,inj,quad, With Preservative 01/10/2016  . Influenza-Unspecified 11/09/2012, 11/27/2013, 11/08/2014, 11/21/2015  . Pneumococcal  Conjugate-13 01/10/2016  . Pneumococcal Polysaccharide-23 02/09/2002  . Td 08/10/2003  . Tdap 08/06/2013  . Zoster 03/12/2005   Pertinent  Health Maintenance Due  Topic Date Due  . INFLUENZA VACCINE  09/10/2018  . DEXA SCAN  Completed  . PNA vac Low Risk Adult  Completed   Fall Risk  11/05/2017 01/07/2017 11/18/2015 06/06/2015 06/06/2015  Falls in the past year? No No No No No  Number falls in past yr: - - - - -   Functional Status Survey:    Vitals:   05/27/18 0906  BP: 138/68  Pulse: 76  Resp: (!) 24  Temp: 98.1 F (36.7 C)  SpO2: 92%  Weight: 147 lb 6.4 oz (66.9 kg)  Height: 5' 2.5" (1.588 m)   Body mass index is 26.53 kg/m. Physical Exam Constitutional:      General: She is not in acute distress.    Appearance: Normal appearance. She  is not ill-appearing, toxic-appearing or diaphoretic.     Comments: Over weight  HENT:     Head: Normocephalic and atraumatic.     Nose: Nose normal.     Mouth/Throat:     Mouth: Mucous membranes are moist.     Pharynx: Oropharynx is clear.  Eyes:     Extraocular Movements: Extraocular movements intact.     Conjunctiva/sclera: Conjunctivae normal.     Pupils: Pupils are equal, round, and reactive to light.  Neck:     Musculoskeletal: Normal range of motion and neck supple.  Cardiovascular:     Rate and Rhythm: Regular rhythm.     Heart sounds: No murmur.  Pulmonary:     Effort: Pulmonary effort is normal.     Breath sounds: No wheezing, rhonchi or rales.  Abdominal:     Palpations: Abdomen is soft.     Tenderness: There is no abdominal tenderness. There is no right CVA tenderness, left CVA tenderness, guarding or rebound.     Comments: abd discomfort on palpation.   Musculoskeletal:     Right lower leg: Edema present.     Left lower leg: Edema present.     Comments: Trace edema BLE  Skin:    General: Skin is warm and dry.     Findings: No erythema.     Comments: Brownish pigmented skin changes BLE.   Neurological:     General: No focal deficit present.     Mental Status: She is alert. Mental status is at baseline.     Cranial Nerves: No cranial nerve deficit.     Motor: No weakness.     Coordination: Coordination normal.     Gait: Gait abnormal.     Comments: Oriented to person and place.   Psychiatric:        Mood and Affect: Mood normal.        Behavior: Behavior normal.     Labs reviewed: Recent Labs    09/09/17  NA 140  K 4.4  CL 103  CO2 33  BUN 21  CREATININE 0.8  CALCIUM 10.0   Recent Labs    09/09/17  AST 24  ALT 22  ALKPHOS 67  PROT 6.2  ALBUMIN 4.0   Recent Labs    09/09/17  WBC 4.6  HGB 14.5  HCT 42  PLT 215   Lab Results  Component Value Date   TSH 0.99 09/09/2017   No results found for: HGBA1C Lab Results  Component Value Date    CHOL 150 02/04/2017   HDL 72  02/04/2017   LDLCALC 65 02/04/2017   TRIG 54 02/04/2017   CHOLHDL 2.1 02/04/2017    Significant Diagnostic Results in last 30 days:  No results found.  Assessment/Plan Retroperitoneal liposarcoma (HCC)  history of retroperitoneal liposarcoma, significant enlargement, opted out surgery, discomfort abd sometimes. Hospice referral may considered in the future.   Insomnia Stable, continue Mirtazapine 7.5mg  qd, prn Ambien 5mg  hs prn.   Constipation Stable, continue Senokot S I qhs, MiraLax qd.  GERD (gastroesophageal reflux disease) Stable, continue Omeprazole 20mg  qd.  CHF (congestive heart failure), NYHA class I, chronic, diastolic (HCC) compensated clinically, continue  Furosemide 20mg  qd, O2 dependent.  HTN (hypertension) Blood pressure is controlled, continue Diltiazem 120mg  qd.   PAF (paroxysmal atrial fibrillation) (HCC) Heart rate is controlled, continue Diltiazem 120mg  qd, Eliquis 2.5mg  bid.   Dementia with behavioral disturbance ([place]) Stable, continue AL [facility] for safety and care assistance. Continue Donepezil 10mg  qd, Depakote 125mg  bid.   Advanced care planning/counseling discussion Goals of care discussion: reviewed gaols of care with the patient's HPOA, sons, teleconference. Went over and filled out MOST for between 2:45pm -3:15pm/ the patient would like to be DNR when the patient has no pulse and is not breathing, DNR form signed by me. She would like to be transferred to hospital if indicated with limited additional interventions and avoid intensive care unit in case of medical illness. She agrees to  antibiotics if indicated when infection occurs. She desires IVF and feeding tube for a defined trial period if oral fluid and nutrition unfeasible physically. MOST was signed by myself, social worker, HPOA will sign the MOST form when received in mail.  Copies made for chart and family.      Family/ staff Communication: plan of care  reviewed with the patient, the patient's HPOA, sons, Education officer, museum, and Camera operator.   Labs/tests ordered:  none  Time spend 25 minutes.

## 2018-05-27 NOTE — Assessment & Plan Note (Signed)
Heart rate is controlled, continue Diltiazem 120mg  qd, Eliquis 2.5mg  bid.

## 2018-05-27 NOTE — Assessment & Plan Note (Signed)
compensated clinically, continue  Furosemide 20mg  qd, O2 dependent.

## 2018-05-27 NOTE — Progress Notes (Deleted)
Location:  [facility] Room Number: 527 Place of Service:  ALF [phone]) Provider:  Marlana Latus  NP  Mast, Man X, NP  Patient Care Team: Mast, Man X, NP as PCP - General (Internal Medicine) Nahser, Wonda Cheng, MD as PCP - Cardiology (Cardiology) Mast, Man X, NP as Nurse Practitioner (Nurse Practitioner) Bo Merino, MD as Consulting Physician (Rheumatology) Curly Rim, MD as Consulting Physician (Internal Medicine) Ernst Spell, MD as Referring Physician (Urology) Charlotte Crumb, MD as Consulting Physician (Orthopedic Surgery) Festus Aloe, MD as Consulting Physician (Urology)  Extended Emergency Contact Information Primary Emergency Contact: [facility] Address: [address]          [place], [place] [address] [name] of [place] Phone: [phone] Mobile Phone: [phone] Relation: Son Secondary Emergency Contact: Alessandra Grout [place] of [place] Phone: [phone] Relation: Other  Code Status:  Full Code Goals of care: Advanced Directive information Advanced Directives 05/23/2018  Does Patient Have a Medical Advance Directive? Yes  Type of Advance Directive [place]  Does patient want to make changes to medical advance directive? No - Patient declined  Copy of [place] in Chart? Yes - validated most recent copy scanned in chart (See row information)  Would patient like information on creating a medical advance directive? -     Chief Complaint  Patient presents with  . Acute Visit    ACP visit     HPI:  Pt is a 85 y.o. female seen today for an acute visit for    Past Medical History:  Diagnosis Date  . Abnormality of gait 12/30/2015  . Arthritis   . Asthma   . Atrial fibrillation ([place])   . DD (diverticular disease) 10/26/2013  . 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 Diverticulitis   . Edema 12/30/2015  . Gout   . Gout 12/30/2015  . History  of CVA (cerebrovascular accident)    Left basal ganglia   . Hyperlipemia   . Hypertension   . Liposarcoma ([place]) 12/20/2015   excised 12/20/15  . Mitral valve regurgitation   . Mitral valve regurgitation 12/30/2015  . OP (osteoporosis) 10/26/2013  . PAF (paroxysmal atrial fibrillation) ([place]) 07/28/2013  . Pseudogout of knee 07/28/2013   03/16/13 Uric acid 5.5   . Spondylosis of cervical joint 12/30/2015  . Spondylosis of cervical spine   . TI (tricuspid incompetence) 05/18/2011   Overview:  Moderate severe   . Unspecified vitamin D deficiency 07/28/2013   03/16/13 Vit D 24.6   . Urinary incontinence without sensory awareness 07/28/2013  . Venous (peripheral) insufficiency 02/22/2014   Past Surgical History:  Procedure Laterality Date  . COLONOSCOPY  2011   Dr. Cindie Laroche Jule Ser)  . remove fatty tumor  2017  . THUMB ARTHROSCOPY Right 2014   Dr. Burney Gauze  . TONSILLECTOMY  1938    Allergies  Allergen Reactions  . Ace Inhibitors Cough    Cough  . Codeine Nausea And Vomiting  . Penicillins Hives and Rash  . Sulfa Antibiotics Hives and Rash    Outpatient Encounter Medications as of 05/27/2018  Medication Sig  . acetaminophen (TYLENOL) 500 MG tablet Take 500 mg by mouth daily as needed.  Marland Kitchen albuterol (PROVENTIL HFA;VENTOLIN HFA) 108 (90 Base) MCG/ACT inhaler Inhale 2 puffs into the lungs every 6 (six) hours as needed for wheezing or shortness of breath.  Marland Kitchen apixaban (ELIQUIS) 2.5 MG TABS tablet Take 1 tablet (2.5 mg total) by mouth 2 (  two) times daily.  Marland Kitchen diltiazem (CARDIZEM) 120 MG tablet Take 120 mg by mouth daily.  . divalproex (DEPAKOTE) 125 MG DR tablet Take 125 mg by mouth 2 (two) times daily.  Marland Kitchen donepezil (ARICEPT) 10 MG tablet Take 10 mg by mouth daily.   . Flavoring Agent (WILD CHERRY FLAVOR) LIQD 500 mg by Does not apply route daily.  . furosemide (LASIX) 20 MG tablet Take 20 mg by mouth. ON Monday, Wednesday AND FRIDAY  . Melatonin 3 MG TABS Take 3 mg by mouth at  bedtime.  . mirtazapine (REMERON) 7.5 MG tablet Take 7.5 mg by mouth at bedtime.  . Multiple Vitamins-Minerals (CENTRUM SILVER ADULT 50+ PO) Take 1 tablet by mouth every morning.   . Omega-3 Fatty Acids (FISH OIL PO) Take 1 capsule by mouth daily.  Marland Kitchen omeprazole (PRILOSEC) 20 MG capsule Take 20 mg by mouth daily. Give 30 minutes prior to meal.  . OXYGEN Inhale 2 L into the lungs daily as needed. Shortness of breath: Obtain a full set of vital signs with O2 sat, and auscultate lungs. Start O2 @ 2 l/m via nasal cannula for SOB or sat<90% on room air. Reassess O2 sat with 15 minutes. Notify provider if sat does not improve within 30 minutes.  . polyethylene glycol (MIRALAX / GLYCOLAX) packet Take 17 g by mouth daily.  . sennosides-docusate sodium (SENOKOT-S) 8.6-50 MG tablet Take 1 tablet by mouth at bedtime.  Marland Kitchen zolpidem (AMBIEN) 5 MG tablet Take 1 tablet (5 mg total) by mouth at bedtime as needed. for insomnia   No facility-administered encounter medications on file as of 05/27/2018.     Review of Systems  Immunization History  Administered Date(s) Administered  . Influenza Split 11/07/2009  . Influenza Whole 11/11/2017  . Influenza, High Dose Seasonal PF 01/10/2016, 11/18/2016  . Influenza,inj,quad, With Preservative 01/10/2016  . Influenza-Unspecified 11/09/2012, 11/27/2013, 11/08/2014, 11/21/2015  . Pneumococcal Conjugate-13 01/10/2016  . Pneumococcal Polysaccharide-23 02/09/2002  . Td 08/10/2003  . Tdap 08/06/2013  . Zoster 03/12/2005   Pertinent  Health Maintenance Due  Topic Date Due  . INFLUENZA VACCINE  09/10/2018  . DEXA SCAN  Completed  . PNA vac Low Risk Adult  Completed   Fall Risk  11/05/2017 01/07/2017 11/18/2015 06/06/2015 06/06/2015  Falls in the past year? No No No No No  Number falls in past yr: - - - - -   Functional Status Survey:    Vitals:   05/27/18 0906  BP: 138/68  Pulse: 76  Resp: (!) 24  Temp: 98.1 F (36.7 C)  SpO2: 92%  Weight: 147 lb 6.4 oz (66.9  kg)  Height: 5' 2.5" (1.588 m)   Body mass index is 26.53 kg/m. Physical Exam  Labs reviewed: Recent Labs    09/09/17  NA 140  K 4.4  CL 103  CO2 33  BUN 21  CREATININE 0.8  CALCIUM 10.0   Recent Labs    09/09/17  AST 24  ALT 22  ALKPHOS 67  PROT 6.2  ALBUMIN 4.0   Recent Labs    09/09/17  WBC 4.6  HGB 14.5  HCT 42  PLT 215   Lab Results  Component Value Date   TSH 0.99 09/09/2017   No results found for: HGBA1C Lab Results  Component Value Date   CHOL 150 02/04/2017   HDL 72 02/04/2017   LDLCALC 65 02/04/2017   TRIG 54 02/04/2017   CHOLHDL 2.1 02/04/2017    Significant Diagnostic Results in last 30  days:  No results found.  Assessment/Plan There are no diagnoses linked to this encounter.   Family/ staff Communication:   Labs/tests ordered:

## 2018-05-27 NOTE — Assessment & Plan Note (Addendum)
Goals of care discussion: reviewed gaols of care with the patient's HPOA, sons, teleconference. Went over and filled out MOST for between 2:45pm -3:15pm/ the patient would like to be DNR when the patient has no pulse and is not breathing, DNR form signed by me. She would like to be transferred to hospital if indicated with limited additional interventions and avoid intensive care unit in case of medical illness. She agrees to  antibiotics if indicated when infection occurs. She desires IVF and feeding tube for a defined trial period if oral fluid and nutrition unfeasible physically. MOST was signed by myself, social worker, HPOA will sign the MOST form when received in mail.  Copies made for chart and family.

## 2018-05-27 NOTE — Assessment & Plan Note (Signed)
Stable, continue Omeprazole 20mg qd.  

## 2018-05-27 NOTE — Assessment & Plan Note (Signed)
Stable, continue [facility] for safety and care assistance. Continue Donepezil 10mg  qd, Depakote 125mg  bid.

## 2018-05-27 NOTE — Assessment & Plan Note (Addendum)
history of retroperitoneal liposarcoma, significant enlargement, opted out surgery, discomfort abd sometimes. Hospice referral may considered in the future.

## 2018-05-27 NOTE — Assessment & Plan Note (Signed)
Stable, continue Senokot S I qhs, MiraLax qd 

## 2018-06-03 ENCOUNTER — Non-Acute Institutional Stay: Payer: Medicare Other | Admitting: Internal Medicine

## 2018-06-03 ENCOUNTER — Encounter: Payer: Self-pay | Admitting: Internal Medicine

## 2018-06-03 DIAGNOSIS — I10 Essential (primary) hypertension: Secondary | ICD-10-CM

## 2018-06-03 DIAGNOSIS — I480 Paroxysmal atrial fibrillation: Secondary | ICD-10-CM

## 2018-06-03 DIAGNOSIS — J690 Pneumonitis due to inhalation of food and vomit: Secondary | ICD-10-CM

## 2018-06-03 DIAGNOSIS — I5032 Chronic diastolic (congestive) heart failure: Secondary | ICD-10-CM | POA: Diagnosis not present

## 2018-06-03 NOTE — Progress Notes (Signed)
Location:  [facility] Room Number: 242 Place of Service:  ALF [phone]) Provider:  Veleta Miners  MD  Mast, Man X, NP  Patient Care Team: Mast, Man X, NP as PCP - General (Internal Medicine) Nahser, Wonda Cheng, MD as PCP - Cardiology (Cardiology) Mast, Man X, NP as Nurse Practitioner (Nurse Practitioner) Bo Merino, MD as Consulting Physician (Rheumatology) Curly Rim, MD as Consulting Physician (Internal Medicine) Ernst Spell, MD as Referring Physician (Urology) Charlotte Crumb, MD as Consulting Physician (Orthopedic Surgery) Festus Aloe, MD as Consulting Physician (Urology)  Extended Emergency Contact Information Primary Emergency Contact: [facility] Address: [address]          [place], [place] [address] [name] of [place] Phone: [phone] Mobile Phone: [phone] Relation: Son Secondary Emergency Contact: Alessandra Grout [place] of [place] Phone: [phone] Relation: Other   Code Status:  DNR Goals of care: Advanced Directive information Advanced Directives 05/23/2018  Does Patient Have a Medical Advance Directive? Yes  Type of Advance Directive [place]  Does patient want to make changes to medical advance directive? No - Patient declined  Copy of [place] in Chart? Yes - validated most recent copy scanned in chart (See row information)  Would patient like information on creating a medical advance directive? -     Chief Complaint  Patient presents with  . Altered Mental Status    C/o - change in status    HPI:  Pt is a 85 y.o. female seen today for an acute visit for C/O Worsening SOB and weakness. 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 Not surgical candidate anymore per notes By  [facility] her Tumor has grown significantly and is pressing on Right kidney and Liver and she is would not benefit from Surgery and will have prolong recovery. At this time she is also not candidate for  palliative radiation.  Patient for past few weeks have been c/o Weakness and SOB. She is now on 2 lit of oxygen. She did have low grade temp per nurses . Had Productive Cough. Increased Swelling in her Legs. She seems to be upset as they had her in different room due to isolation units. Her weight is 2 lbs up. Appetite is not good. She had Labs drawn in facility and her White count was normal but her BNP was elevated to 175 and her Chest Xray showed Bilateral infiltrate ? Pneumonia vs CHF Her Chest Xray few weeks ago was normal    Past Medical History:  Diagnosis Date  . Abnormality of gait 12/30/2015  . Arthritis   . Asthma   . Atrial fibrillation ([place])   . DD (diverticular disease) 10/26/2013  . 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 Diverticulitis   . Edema 12/30/2015  . Gout   . Gout 12/30/2015  . History of CVA (cerebrovascular accident)    Left basal ganglia   . Hyperlipemia   . Hypertension   . Liposarcoma ([place]) 12/20/2015   excised 12/20/15  . Mitral valve regurgitation   . Mitral valve regurgitation 12/30/2015  . OP (osteoporosis) 10/26/2013  . PAF (paroxysmal atrial fibrillation) ([place]) 07/28/2013  . Pseudogout of knee 07/28/2013   03/16/13 Uric acid 5.5   . Spondylosis of cervical joint 12/30/2015  . Spondylosis of cervical spine   .  TI (tricuspid incompetence) 05/18/2011   Overview:  Moderate severe   . Unspecified vitamin D deficiency 07/28/2013   03/16/13 Vit D 24.6   . Urinary incontinence without sensory awareness 07/28/2013  . Venous (peripheral) insufficiency 02/22/2014   Past Surgical History:  Procedure Laterality Date  . COLONOSCOPY  2011   Dr. Cindie Laroche Jule Ser)  . remove fatty tumor  2017  . THUMB ARTHROSCOPY Right 2014   Dr. Burney Gauze  .  TONSILLECTOMY  1938    Allergies  Allergen Reactions  . Ace Inhibitors Cough    Cough  . Codeine Nausea And Vomiting  . Penicillins Hives and Rash  . Sulfa Antibiotics Hives and Rash    Outpatient Encounter Medications as of 06/03/2018  Medication Sig  . acetaminophen (TYLENOL) 500 MG tablet Take 500 mg by mouth daily as needed.  Marland Kitchen albuterol (PROVENTIL HFA;VENTOLIN HFA) 108 (90 Base) MCG/ACT inhaler Inhale 2 puffs into the lungs every 6 (six) hours as needed for wheezing or shortness of breath.  Marland Kitchen apixaban (ELIQUIS) 2.5 MG TABS tablet Take 1 tablet (2.5 mg total) by mouth 2 (two) times daily.  Marland Kitchen diltiazem (CARDIZEM) 120 MG tablet Take 120 mg by mouth daily.  . divalproex (DEPAKOTE) 125 MG DR tablet Take 125 mg by mouth 2 (two) times daily.  Marland Kitchen donepezil (ARICEPT) 10 MG tablet Take 10 mg by mouth daily.   . Flavoring Agent (WILD CHERRY FLAVOR) LIQD 500 mg by Does not apply route daily.  . furosemide (LASIX) 20 MG tablet Take 20 mg by mouth. ON Monday, Wednesday AND FRIDAY  . Melatonin 3 MG TABS Take 3 mg by mouth at bedtime.  . mirtazapine (REMERON) 7.5 MG tablet Take 7.5 mg by mouth at bedtime.  . Multiple Vitamins-Minerals (CENTRUM SILVER ADULT 50+ PO) Take 1 tablet by mouth every morning.   . Omega-3 Fatty Acids (FISH OIL PO) Take 1 capsule by mouth daily.  Marland Kitchen omeprazole (PRILOSEC) 20 MG capsule Take 20 mg by mouth daily. Give 30 minutes prior to meal.  . OXYGEN Inhale 2 L into the lungs daily as needed. Shortness of breath: Obtain a full set of vital signs with O2 sat, and auscultate lungs. Start O2 @ 2 l/m via nasal cannula for SOB or sat<90% on room air. Reassess O2 sat with 15 minutes. Notify provider if sat does not improve within 30 minutes.  . polyethylene glycol (MIRALAX / GLYCOLAX) packet Take 17 g by mouth daily.  . sennosides-docusate sodium (SENOKOT-S) 8.6-50 MG tablet Take 1 tablet by mouth at bedtime.  Marland Kitchen zolpidem (AMBIEN) 5 MG tablet Take 1 tablet (5 mg total) by mouth at  bedtime as needed. for insomnia   No facility-administered encounter medications on file as of 06/03/2018.     Review of Systems  Constitutional: Positive for activity change and fatigue.  HENT: Negative.   Respiratory: Positive for cough and shortness of breath.   Cardiovascular: Positive for leg swelling.  Gastrointestinal: Positive for constipation.  Genitourinary: Negative.   Skin: Negative.   Neurological: Positive for weakness.  Psychiatric/Behavioral: Positive for dysphoric mood and sleep disturbance.  All other systems reviewed and are negative.   Immunization History  Administered Date(s) Administered  . Influenza Split 11/07/2009  . Influenza Whole 11/11/2017  . Influenza, High Dose Seasonal PF 01/10/2016, 11/18/2016  . Influenza,inj,quad, With Preservative 01/10/2016  . Influenza-Unspecified 11/09/2012, 11/27/2013, 11/08/2014, 11/21/2015  . Pneumococcal Conjugate-13 01/10/2016  . Pneumococcal Polysaccharide-23 02/09/2002  . Td 08/10/2003  . Tdap 08/06/2013  . Zoster 03/12/2005  Pertinent  Health Maintenance Due  Topic Date Due  . INFLUENZA VACCINE  09/10/2018  . DEXA SCAN  Completed  . PNA vac Low Risk Adult  Completed   Fall Risk  11/05/2017 01/07/2017 11/18/2015 06/06/2015 06/06/2015  Falls in the past year? No No No No No  Number falls in past yr: - - - - -   Functional Status Survey:    Vitals:   06/03/18 1344  BP: (!) 148/80  Pulse: 74  Resp: (!) 22  Temp: 98.4 F (36.9 C)  SpO2: 90%  Weight: 147 lb 6.4 oz (66.9 kg)  Height: 5' 2.5" (1.588 m)   Body mass index is 26.53 kg/m. Physical Exam Vitals signs reviewed.  HENT:     Head: Normocephalic.     Nose: Nose normal.     Mouth/Throat:     Mouth: Mucous membranes are moist.     Pharynx: Oropharynx is clear.  Eyes:     Pupils: Pupils are equal, round, and reactive to light.  Neck:     Musculoskeletal: Neck supple.  Cardiovascular:     Rate and Rhythm: Normal rate and regular rhythm.      Heart sounds: No murmur.  Pulmonary:     Comments: Has Rales bilateral with decreased breadth sounds in Bases Abdominal:     General: Abdomen is flat. Bowel sounds are normal.     Palpations: Abdomen is soft.  Musculoskeletal:     Comments: Has Bilateral swelling   Skin:    General: Skin is warm and dry.  Neurological:     General: No focal deficit present.     Mental Status: She is alert.  Psychiatric:        Attention and Perception: Perception normal.        Mood and Affect: Mood is anxious and depressed.     Labs reviewed: Recent Labs    09/09/17  NA 140  K 4.4  CL 103  CO2 33  BUN 21  CREATININE 0.8  CALCIUM 10.0   Recent Labs    09/09/17  AST 24  ALT 22  ALKPHOS 67  PROT 6.2  ALBUMIN 4.0   Recent Labs    09/09/17  WBC 4.6  HGB 14.5  HCT 42  PLT 215   Lab Results  Component Value Date   TSH 0.99 09/09/2017   No results found for: HGBA1C Lab Results  Component Value Date   CHOL 150 02/04/2017   HDL 72 02/04/2017   LDLCALC 65 02/04/2017   TRIG 54 02/04/2017   CHOLHDL 2.1 02/04/2017    Significant Diagnostic Results in last 30 days:  No results found.  Assessment/Plan Possible Diastolic CHF At this time it is hard for me to rule out volume overload Will start her on Torsemide 20 mg added for 3 days with reval  to her low dose of lasix  Start in Potassium 20 meq Continue to monitor Weights Repeat BMP Repeat Chest Xray in 4 weeks . Pneumonia With her Low grade temp and Bilateral infiltrate Will also treat her With Doxycycline for 7 days Retroperitoneal liposarcoma Discussed with her son.  She is not a surgical date.  Patient does not have a good understanding of her prognosis. She does not seem to be in Pain  Paroxysmal Atrial Fibrillation On Cardizem and Eliquis Dementia with Behavior issues She is on Depakote and Aricept If she does not improve then plan to take her off both these meds Insomnia On Remeron, And [employer]  staff  Communication: With Son  Labs/tests ordered:   Total time spent in this patient care encounter was 45 _  minutes; greater than 50% of the visit spent counseling patient and staff, reviewing records , Labs and coordinating care for problems addressed at this encounter.

## 2018-06-07 LAB — BASIC METABOLIC PANEL
BUN: 16 (ref 4–21)
Creatinine: 0.7 (ref 0.5–1.1)
Glucose: 98
Potassium: 3.9 (ref 3.4–5.3)
Sodium: 146 (ref 137–147)

## 2018-06-10 ENCOUNTER — Other Ambulatory Visit: Payer: Self-pay | Admitting: *Deleted

## 2018-06-10 ENCOUNTER — Encounter: Payer: Self-pay | Admitting: Internal Medicine

## 2018-06-10 ENCOUNTER — Non-Acute Institutional Stay: Payer: Medicare Other | Admitting: Internal Medicine

## 2018-06-10 DIAGNOSIS — I5032 Chronic diastolic (congestive) heart failure: Secondary | ICD-10-CM

## 2018-06-10 DIAGNOSIS — J181 Lobar pneumonia, unspecified organism: Secondary | ICD-10-CM | POA: Diagnosis not present

## 2018-06-10 DIAGNOSIS — I10 Essential (primary) hypertension: Secondary | ICD-10-CM | POA: Diagnosis not present

## 2018-06-10 DIAGNOSIS — I480 Paroxysmal atrial fibrillation: Secondary | ICD-10-CM

## 2018-06-10 DIAGNOSIS — J189 Pneumonia, unspecified organism: Secondary | ICD-10-CM | POA: Insufficient documentation

## 2018-06-10 MED ORDER — LORAZEPAM 0.5 MG PO TABS: 0.2500 mg | ORAL_TABLET | Freq: Three times a day (TID) | ORAL | 0 refills | Status: DC | PRN

## 2018-06-10 NOTE — Telephone Encounter (Signed)
Received fax order request from [facility].  Pended and sent to Dr. Lyndel Safe for approval

## 2018-06-10 NOTE — Progress Notes (Signed)
Location:  [place] Room Number: 806 Place of Service:  ALF (13) Provider:  L.MD   Mast, Man X, NP  Patient Care Team: Mast, Man X, NP as PCP - General (Internal Medicine) Nahser, Wonda Cheng, MD as PCP - Cardiology (Cardiology) Mast, Man X, NP as Nurse Practitioner (Nurse Practitioner) Bo Merino, MD as Consulting Physician (Rheumatology) Curly Rim, MD as Consulting Physician (Internal Medicine) Ernst Spell, MD as Referring Physician (Urology) Charlotte Crumb, MD as Consulting Physician (Orthopedic Surgery) Festus Aloe, MD as Consulting Physician (Urology)  Extended Emergency Contact Information Primary Emergency Contact: [facility] Address: [address]          [place], [place] [address] [name] of [place] Phone: [phone] Mobile Phone: [phone] Relation: Son Secondary Emergency Contact: Alessandra Grout States of [place] Phone: [phone] Relation: Other  Code Status: DNR Goals of care: Advanced Directive information Advanced Directives 06/10/2018  Does Patient Have a Medical Advance Directive? Yes  Type of Advance Directive [place]  Does patient want to make changes to medical advance directive? No - Patient declined  Copy of [place] in Chart? Yes - validated most recent copy scanned in chart (See row information)  Would patient like information on creating a medical advance directive? -     Chief Complaint  Patient presents with  . Acute Visit    SOB Follow up     HPI:  Pt is a 85 y.o. 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 Not surgical candidate anymore per notes By [facility] her Tumor has grown  significantly and is pressing on Right kidney and Liver and she is would not benefit from Surgery and will have prolong recovery. At this time she is also not candidate for  palliative radiation.  Patient for past few weeks was having weakness and shortness of breath and spiked a temp of 100.6 with productive cough and increased swelling in her legs She had a chest x-ray done in the facility which showed bilateral infiltrate pneumonia versus CHF. I started her on doxycycline for 7 days and also gave her torsemide for 3 days. She has lost some weight since then.  Her coughing is much better her breathing is much better She has not spiked any temp.  But her son was saying that she continues to be very weak.  He is concerned that the antibiotics have not worked as she continues to have some cough. He also wanted to know if her mom can be checked for COVID. Patient continues to not have good appetite.  Today she also had some tremors in her hands with some jerking.    Past Medical History:  Diagnosis Date  . Abnormality of gait 12/30/2015  . Arthritis   . Asthma   . Atrial fibrillation ([place])   . DD (diverticular disease) 10/26/2013  . 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 Diverticulitis   . Edema 12/30/2015  . Gout   . Gout 12/30/2015  . History of CVA (cerebrovascular accident)    Left basal ganglia   . Hyperlipemia   . Hypertension   . Liposarcoma ([place]) 12/20/2015   excised 12/20/15  . Mitral valve regurgitation   . Mitral valve regurgitation 12/30/2015  . OP (  osteoporosis) 10/26/2013  . PAF (paroxysmal atrial fibrillation) ([place]) 07/28/2013  . Pseudogout of knee 07/28/2013   03/16/13 Uric acid 5.5   . Spondylosis of cervical joint 12/30/2015  . Spondylosis of cervical spine   . TI (tricuspid incompetence) 05/18/2011   Overview:  Moderate severe   . Unspecified vitamin D deficiency 07/28/2013   03/16/13 Vit D 24.6   . Urinary incontinence without sensory awareness 07/28/2013  . Venous  (peripheral) insufficiency 02/22/2014   Past Surgical History:  Procedure Laterality Date  . COLONOSCOPY  2011   Dr. Cindie Laroche Jule Ser)  . remove fatty tumor  2017  . THUMB ARTHROSCOPY Right 2014   Dr. Burney Gauze  . TONSILLECTOMY  1938    Allergies  Allergen Reactions  . Ace Inhibitors Cough    Cough  . Codeine Nausea And Vomiting  . Penicillins Hives and Rash  . Sulfa Antibiotics Hives and Rash    Outpatient Encounter Medications as of 06/10/2018  Medication Sig  . acetaminophen (TYLENOL) 325 MG tablet Take 650 mg by mouth every 4 (four) hours as needed.  Marland Kitchen acetaminophen (TYLENOL) 500 MG tablet Take 500 mg by mouth daily as needed.  Marland Kitchen albuterol (PROVENTIL HFA;VENTOLIN HFA) 108 (90 Base) MCG/ACT inhaler Inhale 2 puffs into the lungs every 6 (six) hours as needed for wheezing or shortness of breath.  Marland Kitchen apixaban (ELIQUIS) 2.5 MG TABS tablet Take 1 tablet (2.5 mg total) by mouth 2 (two) times daily.  Marland Kitchen diltiazem (CARDIZEM) 120 MG tablet Take 120 mg by mouth daily.  . divalproex (DEPAKOTE) 125 MG DR tablet Take 125 mg by mouth 2 (two) times daily.  Marland Kitchen donepezil (ARICEPT) 10 MG tablet Take 10 mg by mouth daily.   Marland Kitchen doxycycline (VIBRA-TABS) 100 MG tablet Take 100 mg by mouth 2 (two) times daily.  . Flavoring Agent (WILD CHERRY FLAVOR) LIQD 500 mg by Does not apply route daily.  . furosemide (LASIX) 20 MG tablet Take 20 mg by mouth. ON Monday, Wednesday AND FRIDAY  . Melatonin 3 MG TABS Take 3 mg by mouth at bedtime.  . mirtazapine (REMERON) 7.5 MG tablet Take 7.5 mg by mouth at bedtime.  . Multiple Vitamins-Minerals (CENTRUM SILVER ADULT 50+ PO) Take 1 tablet by mouth every morning. 0.4-300-200mg -mcg  . Omega-3 Fatty Acids (FISH OIL PO) Take 1 capsule by mouth daily.  Marland Kitchen omeprazole (PRILOSEC) 20 MG capsule Take 20 mg by mouth daily. Give 30 minutes prior to meal.  . OXYGEN Inhale 2 L into the lungs daily as needed. Shortness of breath: Obtain a full set of vital signs with O2 sat,  and auscultate lungs. Start O2 @ 2 l/m via nasal cannula for SOB or sat<90% on room air. Reassess O2 sat with 15 minutes. Notify provider if sat does not improve within 30 minutes.  . polyethylene glycol (MIRALAX / GLYCOLAX) packet Take 17 g by mouth daily.  Marland Kitchen saccharomyces boulardii (FLORASTOR) 250 MG capsule Take 250 mg by mouth 2 (two) times daily.  . sennosides-docusate sodium (SENOKOT-S) 8.6-50 MG tablet Take 1 tablet by mouth at bedtime.  Marland Kitchen zolpidem (AMBIEN) 5 MG tablet Take 1 tablet (5 mg total) by mouth at bedtime as needed. for insomnia   No facility-administered encounter medications on file as of 06/10/2018.     Review of Systems  Constitutional: Positive for activity change, appetite change, fatigue and unexpected weight change.  HENT: Negative.   Respiratory: Positive for cough and shortness of breath.   Cardiovascular: Negative.   Gastrointestinal: Negative.  Genitourinary: Negative.   Musculoskeletal: Negative.   Skin: Negative.   Neurological: Positive for weakness.  Psychiatric/Behavioral: Negative.     Immunization History  Administered Date(s) Administered  . Influenza Split 11/07/2009  . Influenza Whole 11/11/2017  . Influenza, High Dose Seasonal PF 01/10/2016, 11/18/2016  . Influenza,inj,quad, With Preservative 01/10/2016  . Influenza-Unspecified 11/09/2012, 11/27/2013, 11/08/2014, 11/21/2015  . Pneumococcal Conjugate-13 01/10/2016  . Pneumococcal Polysaccharide-23 02/09/2002  . Td 08/10/2003  . Tdap 08/06/2013  . Zoster 03/12/2005   Pertinent  Health Maintenance Due  Topic Date Due  . INFLUENZA VACCINE  09/10/2018  . DEXA SCAN  Completed  . PNA vac Low Risk Adult  Completed   Fall Risk  11/05/2017 01/07/2017 11/18/2015 06/06/2015 06/06/2015  Falls in the past year? No No No No No  Number falls in past yr: - - - - -   Functional Status Survey:    Vitals:   06/10/18 0902  BP: 130/70  Pulse: 72  Resp: (!) 22  Temp: 98.5 F (36.9 C)  SpO2: 96%   Weight: 134 lb 8 oz (61 kg)  Height: 5' 2.5" (1.588 m)   Body mass index is 24.21 kg/m. Physical Exam Vitals signs reviewed.  HENT:     Head: Normocephalic.     Nose: Nose normal.     Mouth/Throat:     Mouth: Mucous membranes are moist.     Pharynx: Oropharynx is clear.  Eyes:     Pupils: Pupils are equal, round, and reactive to light.  Neck:     Musculoskeletal: Neck supple.  Cardiovascular:     Rate and Rhythm: Normal rate and regular rhythm.  Pulmonary:     Effort: Pulmonary effort is normal.     Comments: Continues to have rales in bottom of her lungs Abdominal:     General: Abdomen is flat. Bowel sounds are normal.     Palpations: Abdomen is soft.  Musculoskeletal:        General: No swelling.  Skin:    General: Skin is warm and dry.  Neurological:     General: No focal deficit present.     Mental Status: She is alert.     Comments: Did have Benign Tremors in her hand with some jerking  Psychiatric:        Mood and Affect: Mood normal.        Thought Content: Thought content normal.        Judgment: Judgment normal.     Labs reviewed: Recent Labs    09/09/17  NA 140  K 4.4  CL 103  CO2 33  BUN 21  CREATININE 0.8  CALCIUM 10.0   Recent Labs    09/09/17  AST 24  ALT 22  ALKPHOS 67  PROT 6.2  ALBUMIN 4.0   Recent Labs    09/09/17  WBC 4.6  HGB 14.5  HCT 42  PLT 215   Lab Results  Component Value Date   TSH 0.99 09/09/2017   No results found for: HGBA1C Lab Results  Component Value Date   CHOL 150 02/04/2017   HDL 72 02/04/2017   LDLCALC 65 02/04/2017   TRIG 54 02/04/2017   CHOLHDL 2.1 02/04/2017    Significant Diagnostic Results in last 30 days:  No results found.  Assessment/Plan Pneumonia Was treated with Doxycycline for 7 days Continues to have some rales and weakness Will repeat Chest Xray on Mon I d/w the son that at this time since she does not have fever  and is showing some improvement will not give her more antibiotics  as he had been requesting If she does spike temperature of more than 100 will get COVID testing. Discussed in detail with her son in the room. Possible Diastolic CHF Treat her with torsemide for few days Repeat chest x-ray pending Her lower extremity edema is resolved at this time Retroperitoneal liposarcoma Discussed with her son.  She is not a surgical date.  Patient does not have a good understanding of her prognosis. She does not seem to be in Pain  Paroxysmal Atrial Fibrillation On Cardizem and Eliquis Dementia with Behavior issues She is on Depakote and Aricept We will decrease her Aricept to 5 mg due to her decreased appetite.  And eventually take her off. We will also consider to eventually take her off the Depakote Insomnia She is on melatonin Will DC the Ambien We will consider increasing Remeron on next visit Decreased appetite Dietary consult Benign essential tremors We will start her on low-dose of Ativan 0.25 mg every 8 hours as needed  Family/ staff Communication:   Labs/tests ordered:   Total time spent in this patient care encounter was  45_  minutes; greater than 50% of the visit spent counseling patient,son and staff, reviewing records , Labs and coordinating care for problems addressed at this encounter.

## 2018-06-14 ENCOUNTER — Telehealth: Payer: Self-pay | Admitting: *Deleted

## 2018-06-14 ENCOUNTER — Encounter: Payer: Self-pay | Admitting: Internal Medicine

## 2018-06-14 ENCOUNTER — Non-Acute Institutional Stay: Payer: Medicare Other | Admitting: Internal Medicine

## 2018-06-14 DIAGNOSIS — J181 Lobar pneumonia, unspecified organism: Secondary | ICD-10-CM

## 2018-06-14 DIAGNOSIS — I480 Paroxysmal atrial fibrillation: Secondary | ICD-10-CM

## 2018-06-14 DIAGNOSIS — I10 Essential (primary) hypertension: Secondary | ICD-10-CM | POA: Diagnosis not present

## 2018-06-14 DIAGNOSIS — J441 Chronic obstructive pulmonary disease with (acute) exacerbation: Secondary | ICD-10-CM | POA: Diagnosis not present

## 2018-06-14 DIAGNOSIS — J189 Pneumonia, unspecified organism: Secondary | ICD-10-CM

## 2018-06-14 DIAGNOSIS — F0391 Unspecified dementia with behavioral disturbance: Secondary | ICD-10-CM

## 2018-06-14 NOTE — Telephone Encounter (Signed)
Received fax from [facility] [phone] for [facility] Oxygen. Filled out and placed in Dr. Steve Rattler box to sign. To be faxed back to [facility] Fax: [phone]

## 2018-06-14 NOTE — Progress Notes (Signed)
.psc  Location:  [place] of Service:  ALF (13) Provider:Gupta Anjali L.MD   Virgie Dad, MD  Patient Care Team: Virgie Dad, MD as PCP - General (Internal Medicine) Nahser, Wonda Cheng, MD as PCP - Cardiology (Cardiology) Mast, Man X, NP as Nurse Practitioner (Nurse Practitioner) Bo Merino, MD as Consulting Physician (Rheumatology) Curly Rim, MD as Consulting Physician (Internal Medicine) Ernst Spell, MD as Referring Physician (Urology) Charlotte Crumb, MD as Consulting Physician (Orthopedic Surgery) Festus Aloe, MD as Consulting Physician (Urology)  Extended Emergency Contact Information Primary Emergency Contact: [facility] Address: [address]          [place], [place] [address] [name] of [place] Phone: [phone] Mobile Phone: [phone] Relation: Son Secondary Emergency Contact: Alessandra Grout [place] of [place] Phone: [phone] Relation: Other  Code Status: DNR Goals of care: Advanced Directive information Advanced Directives 06/10/2018  Does Patient Have a Medical Advance Directive? Yes  Type of Advance Directive [place]  Does patient want to make changes to medical advance directive? No - Patient declined  Copy of [place] in Chart? Yes - validated most recent copy scanned in chart (See row information)  Would patient like information on creating a medical advance directive? -     Chief Complaint  Patient presents with  . Shortness of Breath  . Acute Visit    HPI:  Pt is a 85 y.o. 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 Not surgical candidate anymore per notes [facility] her  Tumor has grown significantly and is pressing on Right kidney and Liver and she is would not benefit from Surgery and will have prolong recovery. At this time she is also not candidate for  palliative radiation.  Patient for past few weeks was having weakness and shortness of breath and spiked a temp of 100.6 with productive cough and increased swelling in her legs She had a chest x-ray done in the facility which showed bilateral infiltrate pneumonia versus CHF. I started her on doxycycline for 7 days and also gave her torsemide for 3 days. She  lost some weight since then.  Her coughing is much better her breathing is much better She has not spiked any temp.  But her son was saying that she continues to be very weak.  He is concerned that the antibiotics have not worked as she continues to have some cough. According to him she is Sleeping most of the time She had repeat Chest Xray done  today and it shows Right Lower Lobe infiltrate Her Left Lower Lobe infiltrate is resolved and so is the Pleural Effusion  Patient did look weak but was awake and responding. She was also SOB as she just had come from the Restroom. Denied any Cough or fever Still has poor Appetite      Past Medical History:  Diagnosis Date  . Abnormality of gait 12/30/2015  . Arthritis   . Asthma   . Atrial fibrillation ([place])   . DD (diverticular disease) 10/26/2013  . 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 Diverticulitis   . Edema 12/30/2015  . Gout   . Gout 12/30/2015  . History of CVA (cerebrovascular accident)  Left basal ganglia   . Hyperlipemia   . Hypertension   . Liposarcoma ([place]) 12/20/2015   excised 12/20/15  . Mitral valve regurgitation   . Mitral valve regurgitation 12/30/2015  . OP (osteoporosis) 10/26/2013  . PAF (paroxysmal atrial fibrillation) ([place]) 07/28/2013  . Pseudogout of knee 07/28/2013   03/16/13 Uric acid 5.5   . Spondylosis of cervical joint 12/30/2015  . Spondylosis of cervical spine    . TI (tricuspid incompetence) 05/18/2011   Overview:  Moderate severe   . Unspecified vitamin D deficiency 07/28/2013   03/16/13 Vit D 24.6   . Urinary incontinence without sensory awareness 07/28/2013  . Venous (peripheral) insufficiency 02/22/2014   Past Surgical History:  Procedure Laterality Date  . COLONOSCOPY  2011   Dr. Cindie Laroche Jule Ser)  . remove fatty tumor  2017  . THUMB ARTHROSCOPY Right 2014   Dr. Burney Gauze  . TONSILLECTOMY  1938    Allergies  Allergen Reactions  . Ace Inhibitors Cough    Cough  . Codeine Nausea And Vomiting  . Penicillins Hives and Rash  . Sulfa Antibiotics Hives and Rash    Outpatient Encounter Medications as of 06/14/2018  Medication Sig  . acetaminophen (TYLENOL) 325 MG tablet Take 650 mg by mouth every 4 (four) hours as needed.  Marland Kitchen acetaminophen (TYLENOL) 500 MG tablet Take 500 mg by mouth daily as needed.  Marland Kitchen albuterol (PROVENTIL HFA;VENTOLIN HFA) 108 (90 Base) MCG/ACT inhaler Inhale 2 puffs into the lungs every 6 (six) hours as needed for wheezing or shortness of breath.  Marland Kitchen apixaban (ELIQUIS) 2.5 MG TABS tablet Take 1 tablet (2.5 mg total) by mouth 2 (two) times daily.  Marland Kitchen diltiazem (CARDIZEM) 120 MG tablet Take 120 mg by mouth daily.  . divalproex (DEPAKOTE) 125 MG DR tablet Take 125 mg by mouth 2 (two) times daily.  Marland Kitchen donepezil (ARICEPT) 10 MG tablet Take 10 mg by mouth daily.   Marland Kitchen doxycycline (VIBRA-TABS) 100 MG tablet Take 100 mg by mouth 2 (two) times daily.  . Flavoring Agent (WILD CHERRY FLAVOR) LIQD 500 mg by Does not apply route daily.  . furosemide (LASIX) 20 MG tablet Take 20 mg by mouth. ON Monday, Wednesday AND FRIDAY  . LORazepam (ATIVAN) 0.5 MG tablet Take 0.5 tablets (0.25 mg total) by mouth every 8 (eight) hours as needed. For jerking  . Melatonin 3 MG TABS Take 3 mg by mouth at bedtime.  . mirtazapine (REMERON) 7.5 MG tablet Take 7.5 mg by mouth at bedtime.  . Multiple Vitamins-Minerals (CENTRUM SILVER ADULT 50+ PO) Take 1  tablet by mouth every morning. 0.4-300-200mg -mcg  . Omega-3 Fatty Acids (FISH OIL PO) Take 1 capsule by mouth daily.  Marland Kitchen omeprazole (PRILOSEC) 20 MG capsule Take 20 mg by mouth daily. Give 30 minutes prior to meal.  . OXYGEN Inhale 2 L into the lungs daily as needed. Shortness of breath: Obtain a full set of vital signs with O2 sat, and auscultate lungs. Start O2 @ 2 l/m via nasal cannula for SOB or sat<90% on room air. Reassess O2 sat with 15 minutes. Notify provider if sat does not improve within 30 minutes.  . polyethylene glycol (MIRALAX / GLYCOLAX) packet Take 17 g by mouth daily.  Marland Kitchen saccharomyces boulardii (FLORASTOR) 250 MG capsule Take 250 mg by mouth 2 (two) times daily.  . sennosides-docusate sodium (SENOKOT-S) 8.6-50 MG tablet Take 1 tablet by mouth at bedtime.  Marland Kitchen zolpidem (AMBIEN) 5 MG tablet Take 1 tablet (5 mg total) by mouth at  bedtime as needed. for insomnia   No facility-administered encounter medications on file as of 06/14/2018.     Review of Systems  Constitutional: Positive for activity change, appetite change, fatigue and unexpected weight change.  HENT: Negative.   Respiratory: Positive for cough and shortness of breath.   Cardiovascular: Negative.   Gastrointestinal: Negative.   Genitourinary: Negative.   Musculoskeletal: Negative.   Skin: Negative.   Neurological: Positive for weakness.  Psychiatric/Behavioral: Negative.     Immunization History  Administered Date(s) Administered  . Influenza Split 11/07/2009  . Influenza Whole 11/11/2017  . Influenza, High Dose Seasonal PF 01/10/2016, 11/18/2016  . Influenza,inj,quad, With Preservative 01/10/2016  . Influenza-Unspecified 11/09/2012, 11/27/2013, 11/08/2014, 11/21/2015  . Pneumococcal Conjugate-13 01/10/2016  . Pneumococcal Polysaccharide-23 02/09/2002  . Td 08/10/2003  . Tdap 08/06/2013  . Zoster 03/12/2005   Pertinent  Health Maintenance Due  Topic Date Due  . INFLUENZA VACCINE  09/10/2018  . DEXA SCAN   Completed  . PNA vac Low Risk Adult  Completed   Fall Risk  11/05/2017 01/07/2017 11/18/2015 06/06/2015 06/06/2015  Falls in the past year? No No No No No  Number falls in past yr: - - - - -   Functional Status Survey:    Vitals:   06/14/18 1915  BP: 134/66  Pulse: 72  Resp: (!) 22  Temp: 98.7 F (37.1 C)   There is no height or weight on file to calculate BMI. Physical Exam Vitals signs reviewed.  HENT:     Head: Normocephalic.     Nose: Nose normal.     Mouth/Throat:     Mouth: Mucous membranes are moist.     Pharynx: Oropharynx is clear.  Eyes:     Pupils: Pupils are equal, round, and reactive to light.  Neck:     Musculoskeletal: Neck supple.  Cardiovascular:     Rate and Rhythm: Normal rate and regular rhythm.  Pulmonary:     Effort: Pulmonary effort is normal.     Comments: Continues to have rales Bilateral Abdominal:     General: Abdomen is flat. Bowel sounds are normal.     Palpations: Abdomen is soft.  Musculoskeletal:        General: No swelling.  Skin:    General: Skin is warm and dry.  Neurological:     General: No focal deficit present.     Mental Status: She is alert.     Comments: Did have Benign Tremors in her hand with some jerking  Psychiatric:        Mood and Affect: Mood normal.        Thought Content: Thought content normal.        Judgment: Judgment normal.     Labs reviewed: Recent Labs    09/09/17  NA 140  K 4.4  CL 103  CO2 33  BUN 21  CREATININE 0.8  CALCIUM 10.0   Recent Labs    09/09/17  AST 24  ALT 22  ALKPHOS 67  PROT 6.2  ALBUMIN 4.0   Recent Labs    09/09/17  WBC 4.6  HGB 14.5  HCT 42  PLT 215   Lab Results  Component Value Date   TSH 0.99 09/09/2017   No results found for: HGBA1C Lab Results  Component Value Date   CHOL 150 02/04/2017   HDL 72 02/04/2017   LDLCALC 65 02/04/2017   TRIG 54 02/04/2017   CHOLHDL 2.1 02/04/2017    Significant Diagnostic Results in last  30 days:  No results found.   Assessment/Plan Pneumonia Continues to have some rales and weakness Will start her on Levaquin for 7 days as she continuous to have Right LL Infiltrate COPD Exacerbation Will start her on Prednisone 40 mg QD for 5 days And Duo Nebs BID for 3 days and then PRN Continue Oxygen but keep Sats to 88-90 % as she is CO2 Retainer Possible Diastolic CHF Treat her with torsemide for few days Xray does not shows no more Vascular Congetion Her lower extremity edema is resolved at this time Retroperitoneal liposarcoma Discussed with her son.  She is not a surgical Candidate Patient does not have a good understanding of her prognosis. She does not seem to be in Pain  Paroxysmal Atrial Fibrillation On Cardizem and Eliquis Dementia with Behavior issues She is on Depakote and Aricept Aricept decreased to 5 mg Also Decrease her Depakote to 125 mg QHS Insomnia She is on melatonin Ambien Discontinued Decreased appetite On Remeron Benign essential tremors on low-dose of Ativan 0.25 mg every 8 hours as needed  Family/ staff Communication:   Labs/tests ordered:   Total time spent in this patient care encounter was  40  minutes; greater than 50% of the visit spent counseling patient,son and staff, reviewing records , Labs and coordinating care for problems addressed at this encounter.

## 2018-06-22 IMAGING — DX DG CHEST 2V
2 series · 2 of 2 positions shown · non-contrast
Comparison: 06/16/2013

CLINICAL DATA: Chest pressure and productive cough.

EXAM:
CHEST  2 VIEW

[chest lat]
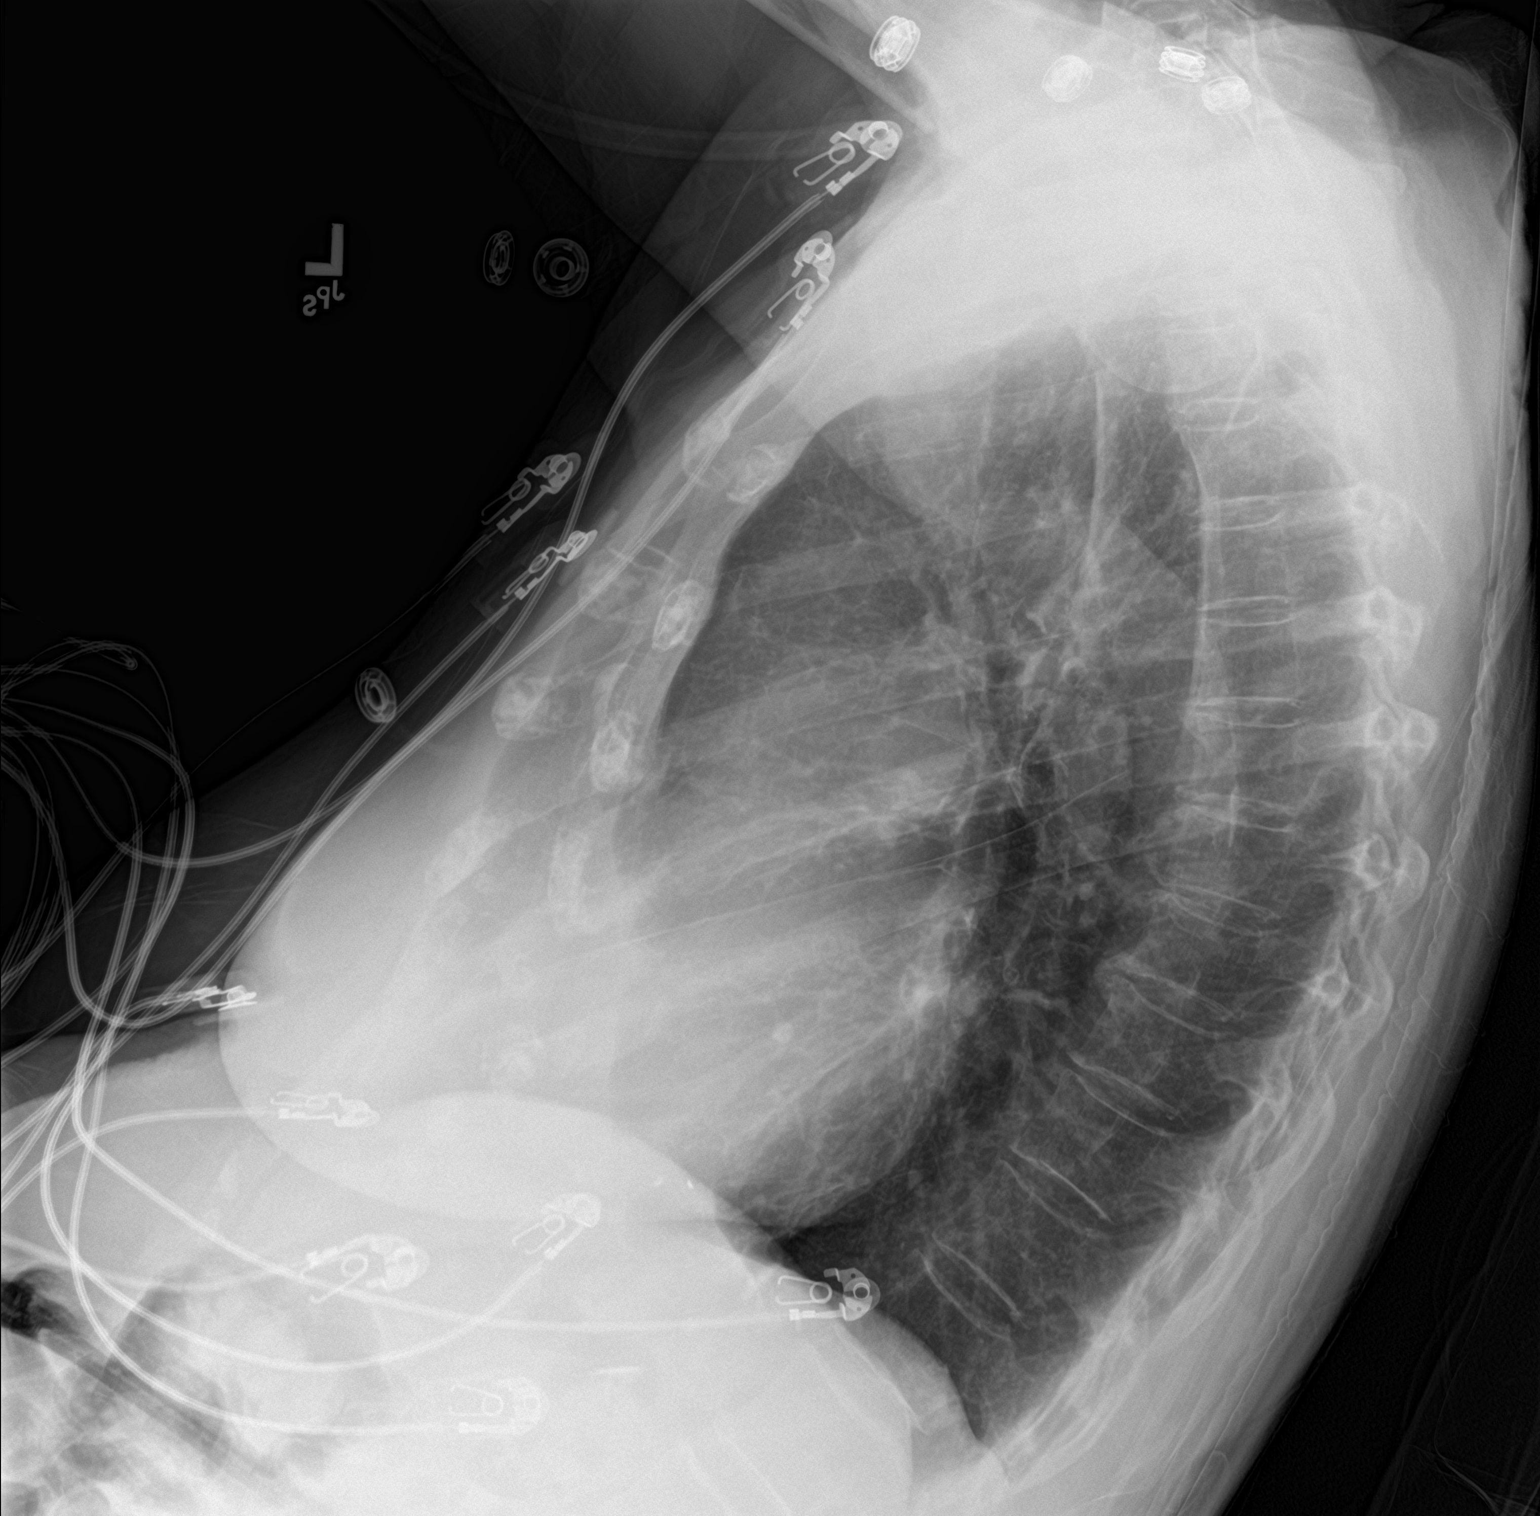

[chest ap]
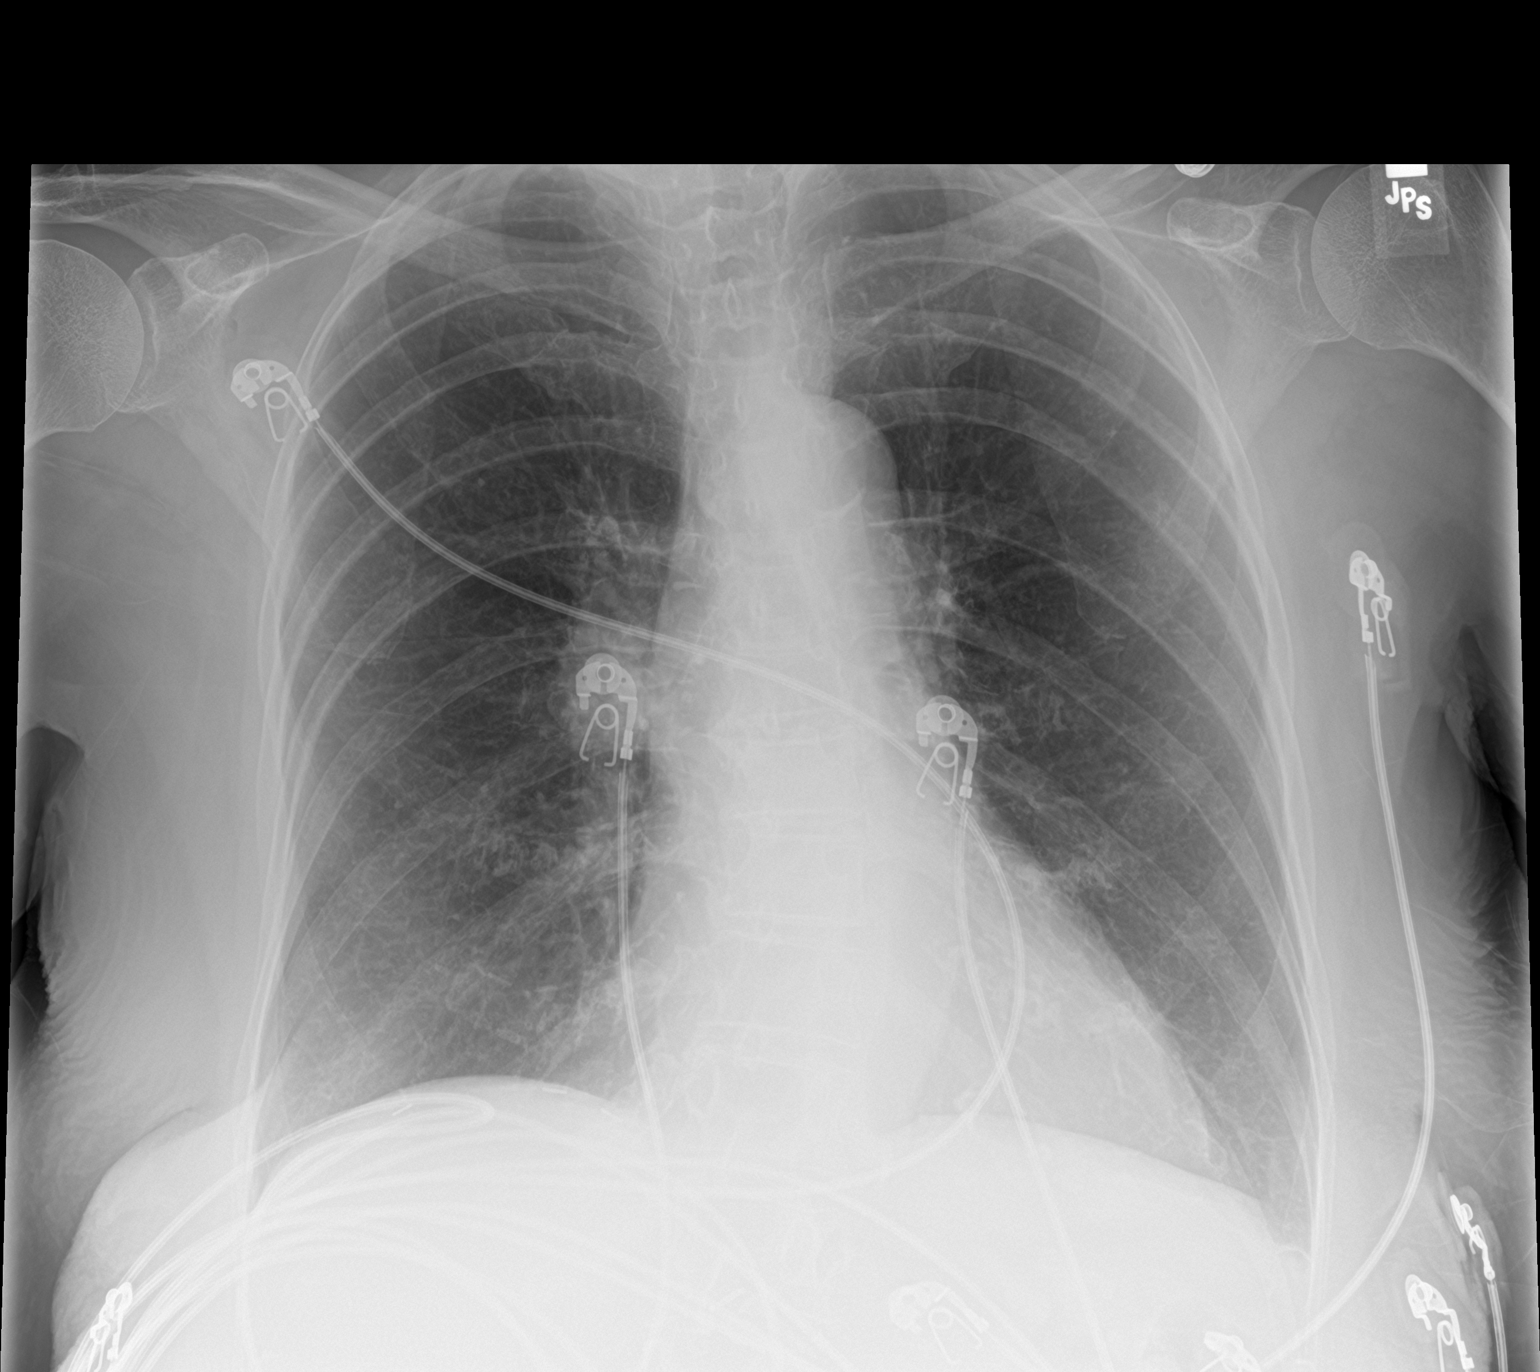

[2 of 2 positions shown; findings below may reference images not displayed]

FINDINGS: The cardiac silhouette is normal in size. Mild thoracic aortic
tortuosity and atherosclerotic calcification are again seen. The
lungs are clear. No pleural effusion or pneumothorax is identified.
There is mild thoracic levoscoliosis.
IMPRESSION: No active cardiopulmonary disease.

## 2018-07-05 ENCOUNTER — Non-Acute Institutional Stay: Payer: Medicare Other | Admitting: Internal Medicine

## 2018-07-05 ENCOUNTER — Encounter: Payer: Self-pay | Admitting: Internal Medicine

## 2018-07-05 DIAGNOSIS — I5032 Chronic diastolic (congestive) heart failure: Secondary | ICD-10-CM | POA: Diagnosis not present

## 2018-07-05 DIAGNOSIS — F0391 Unspecified dementia with behavioral disturbance: Secondary | ICD-10-CM | POA: Diagnosis not present

## 2018-07-05 DIAGNOSIS — C480 Malignant neoplasm of retroperitoneum: Secondary | ICD-10-CM

## 2018-07-05 DIAGNOSIS — I480 Paroxysmal atrial fibrillation: Secondary | ICD-10-CM | POA: Diagnosis not present

## 2018-07-05 NOTE — Progress Notes (Signed)
Location:  [place] Room Number: 806/A Place of Service:  ALF [phone]) Provider:Gupta Anjali L.MD   Virgie Dad, MD  Patient Care Team: Virgie Dad, MD as PCP - General (Internal Medicine) Nahser, Wonda Cheng, MD as PCP - Cardiology (Cardiology) Mast, Man X, NP as Nurse Practitioner (Nurse Practitioner) Bo Merino, MD as Consulting Physician (Rheumatology) Curly Rim, MD as Consulting Physician (Internal Medicine) Ernst Spell, MD as Referring Physician (Urology) Charlotte Crumb, MD as Consulting Physician (Orthopedic Surgery) Festus Aloe, MD as Consulting Physician (Urology)  Extended Emergency Contact Information Primary Emergency Contact: [facility] Address: [address]          [place], [place] [address] [name] of [place] Phone: [phone] Mobile Phone: [phone] Relation: Son Secondary Emergency Contact: Alessandra Grout [place] of [place] Phone: [phone] Relation: Other  Code Status: DNR  Goals of care: Advanced Directive information Advanced Directives 07/05/2018  Does Patient Have a Medical Advance Directive? Yes  Type of Advance Directive [place]  Does patient want to make changes to medical advance directive? No - Patient declined  Copy of [place] in Chart? Yes - validated most recent copy scanned in chart (See row information)  Would patient like information on creating a medical advance directive? -     Chief Complaint  Patient presents with  . Acute Visit    le edema     HPI:  Pt is a 85 y.o. female seen today for an acute visit for Follow up of LE edema.  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 Not surgical candidate anymore  per notes By [facility] her Tumor has grown significantly and is pressing on Right kidney and Liver and she is would not benefit from Surgery and will have prolong recovery. At this time she is also not candidate for palliative radiation.  Patient has had weight gain of 5 lbs and Worsening LE edema. I had started her on Lasix but per Nurses she continues to have LE edema and her weight went few more pounds Patient otherwise is doing well. She has recently recovered from Pneumonia dn COPD. But her Appetite is better. Denies any cough. Continues to have Mild SOB especially with Exertion She also continues to be weak also. But her Appetite has improved     Past Medical History:  Diagnosis Date  . Abnormality of gait 12/30/2015  . Arthritis   . Asthma   . Atrial fibrillation ([place])   . DD (diverticular disease) 10/26/2013  . 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 Diverticulitis   . Edema 12/30/2015  . Gout   . Gout 12/30/2015  . History of CVA (cerebrovascular accident)    Left basal ganglia   . Hyperlipemia   . Hypertension   . Liposarcoma ([place]) 12/20/2015   excised 12/20/15  . Mitral valve regurgitation   . Mitral valve regurgitation 12/30/2015  . OP (osteoporosis) 10/26/2013  . PAF (paroxysmal atrial fibrillation) ([place]) 07/28/2013  . Pseudogout of knee 07/28/2013   03/16/13 Uric acid 5.5   . Spondylosis of cervical joint 12/30/2015  . Spondylosis of cervical spine   . TI (tricuspid incompetence) 05/18/2011   Overview:  Moderate severe   . Unspecified vitamin D deficiency 07/28/2013   03/16/13 Vit D 24.6   .  Urinary incontinence without sensory awareness 07/28/2013  . Venous (peripheral) insufficiency 02/22/2014   Past Surgical History:  Procedure Laterality Date  . COLONOSCOPY  2011   Dr. Cindie Laroche Jule Ser)  . remove fatty tumor  2017  . THUMB ARTHROSCOPY Right 2014   Dr. Burney Gauze  . TONSILLECTOMY  1938    Allergies  Allergen Reactions  . Ace Inhibitors Cough     Cough  . Codeine Nausea And Vomiting  . Penicillins Hives and Rash  . Sulfa Antibiotics Hives and Rash    Outpatient Encounter Medications as of 07/05/2018  Medication Sig  . acetaminophen (TYLENOL) 500 MG tablet Take 500 mg by mouth every 6 (six) hours as needed.   Marland Kitchen albuterol (PROVENTIL HFA;VENTOLIN HFA) 108 (90 Base) MCG/ACT inhaler Inhale 2 puffs into the lungs every 6 (six) hours as needed for wheezing or shortness of breath.  Marland Kitchen apixaban (ELIQUIS) 2.5 MG TABS tablet Take 1 tablet (2.5 mg total) by mouth 2 (two) times daily.  Marland Kitchen diltiazem (CARDIZEM) 120 MG tablet Take 120 mg by mouth daily.  . divalproex (DEPAKOTE) 125 MG DR tablet Take 125 mg by mouth at bedtime.   . donepezil (ARICEPT) 5 MG tablet Take 5 mg by mouth at bedtime.  . furosemide (LASIX) 20 MG tablet Take 20 mg by mouth daily.   Marland Kitchen ipratropium-albuterol (DUONEB) 0.5-2.5 (3) MG/3ML SOLN Take 3 mLs by nebulization every 6 (six) hours as needed.  Marland Kitchen LORazepam (ATIVAN) 0.5 MG tablet Take 0.5 tablets (0.25 mg total) by mouth every 8 (eight) hours as needed. For jerking  . Melatonin 3 MG TABS Take 3 mg by mouth at bedtime.  . mirtazapine (REMERON) 7.5 MG tablet Take 7.5 mg by mouth at bedtime.  . Multiple Vitamins-Minerals (CENTRUM SILVER ADULT 50+ PO) Take 1 tablet by mouth every morning. 0.4-300-200mg -mcg  . Nutritional Supplements (RESOURCE 2.0) LIQD Take 120 mLs by mouth 2 (two) times a day.  . Omega-3 Fatty Acids (FISH OIL PO) Take 1 capsule by mouth daily.  Marland Kitchen omeprazole (PRILOSEC) 20 MG capsule Take 20 mg by mouth daily. Give 30 minutes prior to meal.  . OXYGEN Inhale 2 L into the lungs daily as needed. Shortness of breath: Obtain a full set of vital signs with O2 sat, and auscultate lungs. Start O2 @ 2 l/m via nasal cannula for SOB or sat<90% on room air. Reassess O2 sat with 15 minutes. Notify provider if sat does not improve within 30 minutes.  . polyethylene glycol (MIRALAX / GLYCOLAX) packet Take 17 g by mouth daily.  .  potassium chloride SA (K-DUR) 20 MEQ tablet Take 20 mEq by mouth daily.  . sennosides-docusate sodium (SENOKOT-S) 8.6-50 MG tablet Take 1 tablet by mouth at bedtime.  . [DISCONTINUED] donepezil (ARICEPT) 5 MG tablet Take 5 mg by mouth daily.  . [DISCONTINUED] acetaminophen (TYLENOL) 325 MG tablet Take 650 mg by mouth every 4 (four) hours as needed.  . [DISCONTINUED] donepezil (ARICEPT) 10 MG tablet Take 5 mg by mouth daily.   . [DISCONTINUED] doxycycline (VIBRA-TABS) 100 MG tablet Take 100 mg by mouth 2 (two) times daily.  . [DISCONTINUED] Flavoring Agent (WILD CHERRY FLAVOR) LIQD 500 mg by Does not apply route daily.  . [DISCONTINUED] saccharomyces boulardii (FLORASTOR) 250 MG capsule Take 250 mg by mouth 2 (two) times daily.   No facility-administered encounter medications on file as of 07/05/2018.     Review of Systems  Constitutional: Positive for activity change and unexpected weight change.  HENT: Negative.   Respiratory:  Positive for shortness of breath.   Cardiovascular: Positive for leg swelling.  Gastrointestinal: Positive for constipation.  Genitourinary: Negative.   Musculoskeletal: Negative.   Skin: Negative.   Neurological: Positive for weakness.  Psychiatric/Behavioral: Positive for confusion and dysphoric mood.    Immunization History  Administered Date(s) Administered  . Influenza Split 11/07/2009  . Influenza Whole 11/11/2017  . Influenza, High Dose Seasonal PF 01/10/2016, 11/18/2016  . Influenza,inj,quad, With Preservative 01/10/2016  . Influenza-Unspecified 11/09/2012, 11/27/2013, 11/08/2014, 11/21/2015  . Pneumococcal Conjugate-13 01/10/2016  . Pneumococcal Polysaccharide-23 02/09/2002  . Td 08/10/2003  . Tdap 08/06/2013  . Zoster 03/12/2005   Pertinent  Health Maintenance Due  Topic Date Due  . INFLUENZA VACCINE  09/10/2018  . DEXA SCAN  Completed  . PNA vac Low Risk Adult  Completed   Fall Risk  11/05/2017 01/07/2017 11/18/2015 06/06/2015 06/06/2015   Falls in the past year? No No No No No  Number falls in past yr: - - - - -   Functional Status Survey:    Vitals:   07/05/18 1605  BP: (!) 144/80  Pulse: 69  Resp: (!) 22  Temp: 97.8 F (36.6 C)  SpO2: 92%  Weight: 147 lb 9.6 oz (67 kg)  Height: 5' 2.5" (1.588 m)   Body mass index is 26.57 kg/m. Physical Exam Vitals signs reviewed.  Constitutional:      Appearance: Normal appearance.  HENT:     Head: Normocephalic.     Nose: Nose normal.     Mouth/Throat:     Mouth: Mucous membranes are moist.     Pharynx: Oropharynx is clear.  Eyes:     Pupils: Pupils are equal, round, and reactive to light.  Neck:     Musculoskeletal: Neck supple.  Cardiovascular:     Rate and Rhythm: Normal rate and regular rhythm.     Pulses: Normal pulses.     Heart sounds: Normal heart sounds.  Pulmonary:     Effort: Pulmonary effort is normal.     Comments: Decreased Breadth sounds Bilateral but no Wheezing or Rales Abdominal:     General: Abdomen is flat. Bowel sounds are normal.     Palpations: Abdomen is soft.  Musculoskeletal:     Comments: Moderate swelling Bilateral  Skin:    General: Skin is warm.  Neurological:     General: No focal deficit present.     Mental Status: She is alert.  Psychiatric:        Mood and Affect: Mood normal.        Thought Content: Thought content normal.     Labs reviewed: Recent Labs    09/09/17 06/07/18  NA 140 146  K 4.4 3.9  CL 103  --   CO2 33  --   BUN 21 16  CREATININE 0.8 0.7  CALCIUM 10.0  --    Recent Labs    09/09/17  AST 24  ALT 22  ALKPHOS 67  PROT 6.2  ALBUMIN 4.0   Recent Labs    09/09/17  WBC 4.6  HGB 14.5  HCT 42  PLT 215   Lab Results  Component Value Date   TSH 0.99 09/09/2017   No results found for: HGBA1C Lab Results  Component Value Date   CHOL 150 02/04/2017   HDL 72 02/04/2017   LDLCALC 65 02/04/2017   TRIG 54 02/04/2017   CHOLHDL 2.1 02/04/2017    Significant Diagnostic Results in last 30  days:  No results found.  Assessment/Plan  LE edema With SOB h/o Diastolic CHF Will Change her Lasix to Demadex Continue to monitor her weight Ted hoses for LE Repeat BMP in 1 week Hypernatremia With Recent sodium of 147 Encourage PO free water  COPD  On Nebs as needed Continue Oxygen but keep Sats to 88-90 % as she is CO2 Retainer  Retroperitoneal liposarcoma Discussed with her son. She is not a surgical Candidate Patient does not have a good understanding of her prognosis. She does not seem to be in Pain  Paroxysmal Atrial Fibrillation On Cardizem and Eliquis Dementia with Behavior issues She is on low dose of  Depakote and Aricept Insomnia She is on melatonin Ambien Discontinued Decreased appetite On Remeron Benign essential tremors on low-dose of Ativan 0.25 mg every 8 hours as needed   Family/ staff Communication:   Labs/tests ordered:  BMP Total time spent in this patient care encounter was  40_  minutes; greater than 50% of the visit spent counseling patient and staff, reviewing records , Labs and coordinating care for problems addressed at this encounter.

## 2018-07-12 LAB — BASIC METABOLIC PANEL
BUN: 14 (ref 4–21)
Creatinine: 0.7 (ref 0.5–1.1)
Glucose: 90
Potassium: 3.9 (ref 3.4–5.3)
Sodium: 146 (ref 137–147)

## 2018-07-19 ENCOUNTER — Encounter: Payer: Self-pay | Admitting: Internal Medicine

## 2018-07-19 ENCOUNTER — Non-Acute Institutional Stay: Payer: Medicare Other | Admitting: Internal Medicine

## 2018-07-19 DIAGNOSIS — C480 Malignant neoplasm of retroperitoneum: Secondary | ICD-10-CM | POA: Diagnosis not present

## 2018-07-19 DIAGNOSIS — I480 Paroxysmal atrial fibrillation: Secondary | ICD-10-CM

## 2018-07-19 DIAGNOSIS — R600 Localized edema: Secondary | ICD-10-CM | POA: Diagnosis not present

## 2018-07-19 NOTE — Progress Notes (Signed)
Location:  [place] Room Number: 806/A Place of Service:  ALF [phone]) Provider:Nelsy Madonna L,MD   Virgie Dad, MD  Patient Care Team: Virgie Dad, MD as PCP - General (Internal Medicine) Nahser, Wonda Cheng, MD as PCP - Cardiology (Cardiology) Mast, Man X, NP as Nurse Practitioner (Nurse Practitioner) Bo Merino, MD as Consulting Physician (Rheumatology) Curly Rim, MD as Consulting Physician (Internal Medicine) Ernst Spell, MD as Referring Physician (Urology) Charlotte Crumb, MD as Consulting Physician (Orthopedic Surgery) Festus Aloe, MD as Consulting Physician (Urology)  Extended Emergency Contact Information Primary Emergency Contact: [facility] Address: [address]          [place], [place] [address] [name] of [place] Phone: [phone] Mobile Phone: [phone] Relation: Son Secondary Emergency Contact: Alessandra Grout [place] of [place] Phone: [phone] Relation: Other  Code Status: DNR Goals of care: Advanced Directive information Advanced Directives 07/19/2018  Does Patient Have a Medical Advance Directive? Yes  Type of Advance Directive [place]  Does patient want to make changes to medical advance directive? No - Patient declined  Copy of [place] in Chart? Yes - validated most recent copy scanned in chart (See row information)  Would patient like information on creating a medical advance directive? -     Chief Complaint  Patient presents with  . Acute Visit    LE Edema follow up     HPI:  Pt is a 85 y.o. 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 Not surgical candidate anymore per notes By [facility] her Tumor has grown  significantly and is pressing on Right kidney and Liver and she is would not benefit from Surgery and will have prolong recovery. At this time she is also not candidate for palliative radiation.  Patient was started on Demadex on last visit for her SOB and LE edema Her breathing is better . She does not like Ted hoses. But has Moderate edema in Her LE. Her Appetite is better . No Cough . Still using Oxygen PRN Her weight is down Few Pounds with Demadex    Past Medical History:  Diagnosis Date  . Abnormality of gait 12/30/2015  . Arthritis   . Asthma   . Atrial fibrillation ([place])   . DD (diverticular disease) 10/26/2013  . 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 Diverticulitis   . Edema 12/30/2015  . Gout   . Gout 12/30/2015  . History of CVA (cerebrovascular accident)    Left basal ganglia   . Hyperlipemia   . Hypertension   . Liposarcoma ([place]) 12/20/2015   excised 12/20/15  . Mitral valve regurgitation   . Mitral valve regurgitation 12/30/2015  . OP (osteoporosis) 10/26/2013  . PAF (paroxysmal atrial fibrillation) ([place]) 07/28/2013  . Pseudogout of knee 07/28/2013   03/16/13 Uric acid 5.5   . Spondylosis of cervical joint 12/30/2015  . Spondylosis of cervical spine   . TI (tricuspid incompetence) 05/18/2011   Overview:  Moderate severe   . Unspecified vitamin D deficiency 07/28/2013   03/16/13 Vit D 24.6   . Urinary incontinence without sensory awareness 07/28/2013  . Venous (peripheral) insufficiency 02/22/2014   Past Surgical History:  Procedure Laterality Date  . COLONOSCOPY  2011   Dr. Cindie Laroche Jule Ser)  .  remove fatty tumor  2017  . THUMB ARTHROSCOPY Right 2014   Dr. Burney Gauze  . TONSILLECTOMY  1938    Allergies  Allergen Reactions  . Ace Inhibitors Cough    Cough  . Codeine Nausea And Vomiting  . Penicillins Hives and Rash  . Sulfa Antibiotics Hives and Rash    Outpatient Encounter Medications as of 07/19/2018  Medication Sig  . acetaminophen (TYLENOL)  500 MG tablet Take 500 mg by mouth every 6 (six) hours as needed.   Marland Kitchen albuterol (PROVENTIL HFA;VENTOLIN HFA) 108 (90 Base) MCG/ACT inhaler Inhale 2 puffs into the lungs every 6 (six) hours as needed for wheezing or shortness of breath.  Marland Kitchen apixaban (ELIQUIS) 2.5 MG TABS tablet Take 1 tablet (2.5 mg total) by mouth 2 (two) times daily.  Marland Kitchen diltiazem (CARDIZEM) 120 MG tablet Take 120 mg by mouth daily.  . divalproex (DEPAKOTE) 125 MG DR tablet Take 125 mg by mouth at bedtime.   . donepezil (ARICEPT) 5 MG tablet Take 5 mg by mouth at bedtime.  Marland Kitchen ipratropium-albuterol (DUONEB) 0.5-2.5 (3) MG/3ML SOLN Take 3 mLs by nebulization every 6 (six) hours as needed.  . Melatonin 3 MG TABS Take 3 mg by mouth at bedtime.  . mirtazapine (REMERON) 7.5 MG tablet Take 7.5 mg by mouth at bedtime.  . Multiple Vitamins-Minerals (CENTRUM SILVER ADULT 50+ PO) Take 1 tablet by mouth every morning. 0.4-300-200mg -mcg  . Nutritional Supplements (RESOURCE 2.0) LIQD Take 120 mLs by mouth 2 (two) times a day.  . Omega-3 Fatty Acids (FISH OIL PO) Take 1 capsule by mouth daily.  Marland Kitchen omeprazole (PRILOSEC) 20 MG capsule Take 20 mg by mouth daily. Give 30 minutes prior to meal.  . OXYGEN Inhale 2 L into the lungs daily as needed. Shortness of breath: Obtain a full set of vital signs with O2 sat, and auscultate lungs. Start O2 @ 2 l/m via nasal cannula for SOB or sat<90% on room air. Reassess O2 sat with 15 minutes. Notify provider if sat does not improve within 30 minutes.  . polyethylene glycol (MIRALAX / GLYCOLAX) packet Take 17 g by mouth daily.  . potassium chloride SA (K-DUR) 20 MEQ tablet Take 20 mEq by mouth daily.  . sennosides-docusate sodium (SENOKOT-S) 8.6-50 MG tablet Take 2 tablets by mouth at bedtime.   . torsemide (DEMADEX) 20 MG tablet Take 20 mg by mouth daily.  . [DISCONTINUED] furosemide (LASIX) 20 MG tablet Take 20 mg by mouth daily.   . [DISCONTINUED] LORazepam (ATIVAN) 0.5 MG tablet Take 0.5 tablets (0.25 mg total)  by mouth every 8 (eight) hours as needed. For jerking   No facility-administered encounter medications on file as of 07/19/2018.     Review of Systems  Constitutional: Positive for activity change.  HENT: Negative.   Respiratory: Positive for shortness of breath.   Cardiovascular: Positive for leg swelling.  Gastrointestinal: Negative.   Genitourinary: Negative.   Musculoskeletal: Negative.   Skin: Negative.   Neurological: Positive for weakness.  Psychiatric/Behavioral: Positive for confusion and dysphoric mood.    Immunization History  Administered Date(s) Administered  . Influenza Split 11/07/2009  . Influenza Whole 11/11/2017  . Influenza, High Dose Seasonal PF 01/10/2016, 11/18/2016  . Influenza,inj,quad, With Preservative 01/10/2016  . Influenza-Unspecified 11/09/2012, 11/27/2013, 11/08/2014, 11/21/2015  . Pneumococcal Conjugate-13 01/10/2016  . Pneumococcal Polysaccharide-23 02/09/2002  . Td 08/10/2003  . Tdap 08/06/2013  . Zoster 03/12/2005   Pertinent  Health Maintenance Due  Topic Date Due  . INFLUENZA VACCINE  09/10/2018  .  DEXA SCAN  Completed  . PNA vac Low Risk Adult  Completed   Fall Risk  11/05/2017 01/07/2017 11/18/2015 06/06/2015 06/06/2015  Falls in the past year? No No No No No  Number falls in past yr: - - - - -   Functional Status Survey:    Vitals:   07/19/18 1255  BP: 140/70  Pulse: 76  Resp: (!) 24  Temp: (!) 97.5 F (36.4 C)  SpO2: 95%  Weight: 143 lb (64.9 kg)  Height: 5' 2.5" (1.588 m)   Body mass index is 25.74 kg/m. Physical Exam Vitals signs reviewed.  Constitutional:      Appearance: Normal appearance.  HENT:     Head: Normocephalic.     Nose: Nose normal.     Mouth/Throat:     Mouth: Mucous membranes are moist.     Pharynx: Oropharynx is clear.  Eyes:     Pupils: Pupils are equal, round, and reactive to light.  Neck:     Musculoskeletal: Neck supple.  Cardiovascular:     Rate and Rhythm: Normal rate. Rhythm irregular.      Pulses: Normal pulses.     Heart sounds: Normal heart sounds.  Pulmonary:     Effort: Pulmonary effort is normal. No respiratory distress.     Breath sounds: Normal breath sounds. No wheezing or rales.  Abdominal:     General: Abdomen is flat.     Comments: I can Feel Mass in her Right Upper Quadrant. It hard . Not tender  Musculoskeletal:     Comments: Moderate Edema Bilateral with Some redness around the Ankles  Skin:    General: Skin is dry.  Neurological:     General: No focal deficit present.     Mental Status: She is alert.  Psychiatric:        Mood and Affect: Mood normal.        Thought Content: Thought content normal.     Labs reviewed: Recent Labs    09/09/17 06/07/18 07/12/18  NA 140 146 146  K 4.4 3.9 3.9  CL 103  --   --   CO2 33  --   --   BUN 21 16 14   CREATININE 0.8 0.7 0.7  CALCIUM 10.0  --   --    Recent Labs    09/09/17  AST 24  ALT 22  ALKPHOS 67  PROT 6.2  ALBUMIN 4.0   Recent Labs    09/09/17  WBC 4.6  HGB 14.5  HCT 42  PLT 215   Lab Results  Component Value Date   TSH 0.99 09/09/2017   No results found for: HGBA1C Lab Results  Component Value Date   CHOL 150 02/04/2017   HDL 72 02/04/2017   LDLCALC 65 02/04/2017   TRIG 54 02/04/2017   CHOLHDL 2.1 02/04/2017    Significant Diagnostic Results in last 30 days:  No results found.  Assessment/Plan  LE edema With SOB h/o Diastolic CHF Will continue Low dose Demadex I think her LE edema is worsening due to her Retroperitoneal mass and it pressing n her IVC per Last CT scan. Will repeat BMP in 2 weeks  Retroperitoneal liposarcoma  She is not a surgicalCandidate Patient does not have a good understanding of her prognosis. She does not seem to be in Pain  But her it seems her Mass is growing and causing her symptoms D/W the Nurses to discuss with the POA to review her MOST form and Possible hospice  Hypernatremia With Recent sodium of 146 Encourage PO free water   COPD  On Nebs as needed Continue Oxygen but keep Sats to 88-90 % as she is CO2 Retainer  Paroxysmal Atrial Fibrillation On Cardizem and Eliquis Dementia with Behavior issues She is on low dose of  Depakote and Aricept Insomnia She is on melatonin Ambien Discontinued Decreased appetite On Remeron Benign essential tremors on low-dose of Ativan 0.25 mg every 8 hours as needed   Family/ staff Communication:   Labs/tests ordered:   Total time spent in this patient care encounter was  25_  minutes; greater than 50% of the visit spent counseling patient and staff, reviewing records , Labs and coordinating care for problems addressed at this encounter.

## 2018-07-26 LAB — BASIC METABOLIC PANEL
BUN: 15 (ref 4–21)
Creatinine: 0.8 (ref 0.5–1.1)
Glucose: 85
Potassium: 3.7 (ref 3.4–5.3)
Sodium: 144 (ref 137–147)

## 2018-08-16 LAB — NOVEL CORONAVIRUS, NAA: SARS-CoV-2, NAA: NOT DETECTED

## 2018-08-17 ENCOUNTER — Telehealth: Payer: Self-pay | Admitting: *Deleted

## 2018-08-17 NOTE — Telephone Encounter (Signed)
Received fax from [place] [phone] [place] for Oxygen. Filled out and placed in Dr. Steve Rattler box to review and sign.

## 2018-08-23 ENCOUNTER — Encounter: Payer: Self-pay | Admitting: Internal Medicine

## 2018-08-23 ENCOUNTER — Non-Acute Institutional Stay: Payer: Medicare Other | Admitting: Internal Medicine

## 2018-08-23 DIAGNOSIS — F0391 Unspecified dementia with behavioral disturbance: Secondary | ICD-10-CM | POA: Diagnosis not present

## 2018-08-23 DIAGNOSIS — I5032 Chronic diastolic (congestive) heart failure: Secondary | ICD-10-CM | POA: Diagnosis not present

## 2018-08-23 DIAGNOSIS — I480 Paroxysmal atrial fibrillation: Secondary | ICD-10-CM | POA: Diagnosis not present

## 2018-08-23 DIAGNOSIS — C480 Malignant neoplasm of retroperitoneum: Secondary | ICD-10-CM

## 2018-08-23 DIAGNOSIS — I10 Essential (primary) hypertension: Secondary | ICD-10-CM

## 2018-08-23 NOTE — Progress Notes (Signed)
Location:  [place] Room Number: 806 Place of Service:  ALF [phone]) Provider:Gupta Anjali L,MD   Virgie Dad, MD  Patient Care Team: Virgie Dad, MD as PCP - General (Internal Medicine) Nahser, Wonda Cheng, MD as PCP - Cardiology (Cardiology) Mast, Man X, NP as Nurse Practitioner (Nurse Practitioner) Bo Merino, MD as Consulting Physician (Rheumatology) Curly Rim, MD as Consulting Physician (Internal Medicine) Ernst Spell, MD as Referring Physician (Urology) Charlotte Crumb, MD as Consulting Physician (Orthopedic Surgery) Festus Aloe, MD as Consulting Physician (Urology)  Extended Emergency Contact Information Primary Emergency Contact: [facility] Address: [address]          [place],  [address] [name] of [place] Phone: [phone] Mobile Phone: [phone] Relation: Son Secondary Emergency Contact: Alessandra Grout [place] of [place] Phone: [phone] Relation: Other  Code Status:DNR Goals of care: Advanced Directive information Advanced Directives 08/23/2018  Does Patient Have a Medical Advance Directive? Yes  Type of Advance Directive [place]  Does patient want to make changes to medical advance directive? No - Patient declined  Copy of [place] in Chart? -  Would patient like information on creating a medical advance directive? -     Chief Complaint  Patient presents with  . Medical Management of Chronic Issues    Routine visit     HPI:  Pt is a 85 y.o. female seen today for medical management of chronic diseases.    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 Not surgical candidate anymore per notes By [facility] her Tumor has grown significantly and is pressing on Right kidney and  Liver and she is would not benefit from Surgery and will have prolong recovery. At this time she is also not candidate for palliative radiation  Patient has been stable in facility She stays Independent in her ADLS. She did have some c/o Lower Back Pain Relieved with Tylenol PRN. Continues to have SOB and LE edema but mostly stable with Oxygen No New Nursing issues. Weight is stable Past Medical History:  Diagnosis Date  . Abnormality of gait 12/30/2015  . Arthritis   . Asthma   . Atrial fibrillation ([place])   . DD (diverticular disease) 10/26/2013  . 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 Diverticulitis   . Edema 12/30/2015  . Gout   . Gout 12/30/2015  . History of CVA (cerebrovascular accident)    Left basal ganglia   . Hyperlipemia   . Hypertension   . Liposarcoma ([place]) 12/20/2015   excised 12/20/15  . Mitral valve regurgitation   . Mitral valve regurgitation 12/30/2015  . OP (osteoporosis) 10/26/2013  . PAF (paroxysmal atrial fibrillation) ([place]) 07/28/2013  . Pseudogout of knee 07/28/2013   03/16/13 Uric acid 5.5   . Spondylosis of cervical joint 12/30/2015  . Spondylosis of cervical spine   . TI (tricuspid incompetence) 05/18/2011   Overview:  Moderate severe   . Unspecified vitamin D deficiency 07/28/2013   03/16/13 Vit D 24.6   . Urinary incontinence without sensory awareness 07/28/2013  . Venous (peripheral) insufficiency 02/22/2014   Past Surgical History:  Procedure Laterality Date  . COLONOSCOPY  2011   Dr. Cindie Laroche Jule Ser)  . remove fatty tumor  2017  . THUMB ARTHROSCOPY Right 2014   Dr. Burney Gauze  . TONSILLECTOMY  1938  Allergies  Allergen Reactions  . Ace Inhibitors Cough    Cough  . Codeine Nausea And Vomiting  . Penicillins Hives and Rash  . Sulfa Antibiotics Hives and Rash    Outpatient Encounter Medications as of 08/23/2018  Medication Sig  . acetaminophen (TYLENOL) 325 MG tablet Take 650 mg by mouth every 4 (four) hours as needed.  Marland Kitchen  acetaminophen (TYLENOL) 500 MG tablet Take 500 mg by mouth every 6 (six) hours as needed.   Marland Kitchen albuterol (PROVENTIL HFA;VENTOLIN HFA) 108 (90 Base) MCG/ACT inhaler Inhale 2 puffs into the lungs every 6 (six) hours as needed for wheezing or shortness of breath.  Marland Kitchen apixaban (ELIQUIS) 2.5 MG TABS tablet Take 1 tablet (2.5 mg total) by mouth 2 (two) times daily.  Marland Kitchen diltiazem (CARDIZEM) 120 MG tablet Take 120 mg by mouth daily.  . divalproex (DEPAKOTE) 125 MG DR tablet Take 125 mg by mouth at bedtime.   . donepezil (ARICEPT) 5 MG tablet Take 5 mg by mouth at bedtime.  Marland Kitchen ipratropium-albuterol (DUONEB) 0.5-2.5 (3) MG/3ML SOLN Take 3 mLs by nebulization every 6 (six) hours as needed.  Marland Kitchen LORazepam (ATIVAN) 0.5 MG tablet Take 0.25 mg by mouth every 8 (eight) hours.  . Melatonin 3 MG TABS Take 3 mg by mouth at bedtime.  . mirtazapine (REMERON) 7.5 MG tablet Take 7.5 mg by mouth at bedtime.  . Multiple Vitamins-Minerals (CENTRUM SILVER ADULT 50+ PO) Take 1 tablet by mouth every morning. 0.4-300-200mg -mcg  . Omega-3 Fatty Acids (FISH OIL PO) Take 1 capsule by mouth daily.  Marland Kitchen omeprazole (PRILOSEC) 20 MG capsule Take 20 mg by mouth daily. Give 30 minutes prior to meal.  . OXYGEN Inhale 2 L into the lungs daily as needed. Shortness of breath: Obtain a full set of vital signs with O2 sat, and auscultate lungs. Start O2 @ 2 l/m via nasal cannula for SOB or sat<90% on room air. Reassess O2 sat with 15 minutes. Notify provider if sat does not improve within 30 minutes.  . polyethylene glycol (MIRALAX / GLYCOLAX) packet Take 17 g by mouth daily.  . potassium chloride SA (K-DUR) 20 MEQ tablet Take 20 mEq by mouth daily.  . sennosides-docusate sodium (SENOKOT-S) 8.6-50 MG tablet Take 2 tablets by mouth at bedtime.   . torsemide (DEMADEX) 20 MG tablet Take 20 mg by mouth daily.  . [DISCONTINUED] Nutritional Supplements (RESOURCE 2.0) LIQD Take 120 mLs by mouth 2 (two) times a day.   No facility-administered encounter  medications on file as of 08/23/2018.     Review of Systems  Constitutional: Negative.   HENT: Negative.   Respiratory: Positive for cough and shortness of breath.   Cardiovascular: Positive for leg swelling.  Genitourinary: Negative.   Musculoskeletal: Positive for back pain.  Neurological: Positive for weakness.  Psychiatric/Behavioral: Negative.     Immunization History  Administered Date(s) Administered  . Influenza Split 11/07/2009  . Influenza Whole 11/11/2017  . Influenza, High Dose Seasonal PF 01/10/2016, 11/18/2016  . Influenza,inj,quad, With Preservative 01/10/2016  . Influenza-Unspecified 11/09/2012, 11/27/2013, 11/08/2014, 11/21/2015  . Pneumococcal Conjugate-13 01/10/2016  . Pneumococcal Polysaccharide-23 02/09/2002  . Td 08/10/2003  . Tdap 08/06/2013  . Zoster 03/12/2005   Pertinent  Health Maintenance Due  Topic Date Due  . INFLUENZA VACCINE  09/10/2018  . DEXA SCAN  Completed  . PNA vac Low Risk Adult  Completed   Fall Risk  11/05/2017 01/07/2017 11/18/2015 06/06/2015 06/06/2015  Falls in the past year? No No No No No  Number falls in past yr: - - - - -   Functional Status Survey:    Vitals:   08/23/18 1415  BP: (!) 150/80  Pulse: 72  Resp: 20  Temp: 98.3 F (36.8 C)  SpO2: 98%  Weight: 140 lb 3.2 oz (63.6 kg)  Height: 5' 2.5" (1.588 m)   Body mass index is 25.23 kg/m. Physical Exam Vitals signs reviewed.  HENT:     Head: Normocephalic.     Nose: Nose normal.     Mouth/Throat:     Mouth: Mucous membranes are moist.     Pharynx: Oropharynx is clear.  Eyes:     Pupils: Pupils are equal, round, and reactive to light.  Neck:     Musculoskeletal: Neck supple.  Cardiovascular:     Rate and Rhythm: Normal rate. Rhythm irregular.     Pulses: Normal pulses.     Heart sounds: Normal heart sounds.  Pulmonary:     Effort: Pulmonary effort is normal.     Breath sounds: Normal breath sounds. No wheezing.  Abdominal:     General: Abdomen is flat.  Bowel sounds are normal.     Comments: Has right Upper quadrant Mass. Not tender  Musculoskeletal:     Comments: Mild swelling Bilateral  Skin:    General: Skin is warm and dry.  Neurological:     General: No focal deficit present.     Mental Status: She is alert.  Psychiatric:        Mood and Affect: Mood normal.        Thought Content: Thought content normal.     Labs reviewed: Recent Labs    09/09/17 06/07/18 07/12/18 07/26/18  NA 140 146 146 144  K 4.4 3.9 3.9 3.7  CL 103  --   --   --   CO2 33  --   --   --   BUN 21 16 14 15   CREATININE 0.8 0.7 0.7 0.8  CALCIUM 10.0  --   --   --    Recent Labs    09/09/17  AST 24  ALT 22  ALKPHOS 67  PROT 6.2  ALBUMIN 4.0   Recent Labs    09/09/17  WBC 4.6  HGB 14.5  HCT 42  PLT 215   Lab Results  Component Value Date   TSH 0.99 09/09/2017   No results found for: HGBA1C Lab Results  Component Value Date   CHOL 150 02/04/2017   HDL 72 02/04/2017   LDLCALC 65 02/04/2017   TRIG 54 02/04/2017   CHOLHDL 2.1 02/04/2017    Significant Diagnostic Results in last 30 days:  No results found.  Assessment/Plan  LE edema With SOB h/o Diastolic CHF Will continue Low dose Demadex I think her LE edema is worsening due to her Retroperitoneal mass and it pressing n her IVC per Last CT scan. Weight is stable Would not treat Aggressively    Retroperitoneal liposarcoma  She is not a surgicalCandidate Continue Palliative Support. Have d/w family about Hospice when she worsens. D/W nurses Hypernatremia Resolved With Recent sodium of 144 Encourage PO free water  COPD On Nebs as needed Continue Oxygen but keep Sats to 88-90 % as she is CO2 Retainer Patient has been stable  Paroxysmal Atrial Fibrillation On Cardizem and Eliquis Dementia with Behavior issues She is onlow dose ofDepakote and Aricept Insomnia She is on melatonin Ambien was  Discontinued Decreased appetite On Remeron Benign essential tremors  on low-dose of Ativan  0.25 mg every 8 hours as needed  Family/ staff Communication:   Labs/tests ordered:     Total time spent in this patient care encounter was  25_  minutes; greater than 50% of the visit spent counseling patient and staff, reviewing records , Labs and coordinating care for problems addressed at this encounter.

## 2018-10-14 ENCOUNTER — Encounter: Payer: Self-pay | Admitting: Internal Medicine

## 2018-10-14 ENCOUNTER — Non-Acute Institutional Stay: Payer: Medicare Other | Admitting: Internal Medicine

## 2018-10-14 DIAGNOSIS — I10 Essential (primary) hypertension: Secondary | ICD-10-CM

## 2018-10-14 DIAGNOSIS — F0391 Unspecified dementia with behavioral disturbance: Secondary | ICD-10-CM

## 2018-10-14 DIAGNOSIS — I480 Paroxysmal atrial fibrillation: Secondary | ICD-10-CM

## 2018-10-14 DIAGNOSIS — C480 Malignant neoplasm of retroperitoneum: Secondary | ICD-10-CM | POA: Diagnosis not present

## 2018-10-14 DIAGNOSIS — I5032 Chronic diastolic (congestive) heart failure: Secondary | ICD-10-CM | POA: Diagnosis not present

## 2018-10-14 MED ORDER — MORPHINE SULFATE (CONCENTRATE) 20 MG/ML PO SOLN: 5.0000 mg | ORAL | 0 refills | Status: DC | PRN

## 2018-10-14 NOTE — Progress Notes (Signed)
Location: [place] Room Number: 806 Place of Service:  ALF (13)  Provider:   Code Status:  Goals of Care:  Advanced Directives 10/14/2018  Does Patient Have a Medical Advance Directive? Yes  Type of Advance Directive [place]  Does patient want to make changes to medical advance directive? No - Patient declined  Copy of [place] in Chart? -  Would patient like information on creating a medical advance directive? -     Chief Complaint  Patient presents with  . Acute Visit    Abdominal Pain    HPI: Patient is a 85 y.o. 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 Not surgical candidate anymore per notes By [facility] her Tumor has grown significantly and is pressing on Right kidney and Liver and she is would not benefit from Surgery and will have prolong recovery. At this time she is also not candidate for palliative radiation  She was seen today at the request of her son who thought that Tylenol was not helping her pain and she has had more distention recently. Patient looks very weak and frail today.  She  had not eaten any of her lunch.  When I asked her about the pain she said yes her abdomen really hurts.  Patient denied any cough pain shortness of breath.  Her lower extremity edema is better. .  Per Charge nurse patient continues to get up and be independent in her ADLs needing mild assist Has lost almost 10 lbs    Past Medical History:  Diagnosis Date  . Abnormality of gait 12/30/2015  . Arthritis   . Asthma   . Atrial fibrillation ([place])   . DD (diverticular disease) 10/26/2013  . 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 Diverticulitis   . Edema 12/30/2015  . Gout   . Gout 12/30/2015  .  History of CVA (cerebrovascular accident)    Left basal ganglia   . Hyperlipemia   . Hypertension   . Liposarcoma ([place]) 12/20/2015   excised 12/20/15  . Mitral valve regurgitation   . Mitral valve regurgitation 12/30/2015  . OP (osteoporosis) 10/26/2013  . PAF (paroxysmal atrial fibrillation) ([place]) 07/28/2013  . Pseudogout of knee 07/28/2013   03/16/13 Uric acid 5.5   . Spondylosis of cervical joint 12/30/2015  . Spondylosis of cervical spine   . TI (tricuspid incompetence) 05/18/2011   Overview:  Moderate severe   . Unspecified vitamin D deficiency 07/28/2013   03/16/13 Vit D 24.6   . Urinary incontinence without sensory awareness 07/28/2013  . Venous (peripheral) insufficiency 02/22/2014    Past Surgical History:  Procedure Laterality Date  . COLONOSCOPY  2011   Dr. Cindie Laroche Jule Ser)  . remove fatty tumor  2017  . THUMB ARTHROSCOPY Right 2014   Dr. Burney Gauze  . TONSILLECTOMY  1938    Allergies  Allergen Reactions  . Ace Inhibitors Cough    Cough  . Codeine Nausea And Vomiting  . Penicillins Hives and Rash  . Sulfa Antibiotics Hives and Rash    Outpatient Encounter Medications as of 10/14/2018  Medication Sig  . acetaminophen (TYLENOL) 500 MG tablet Take 500 mg by mouth every 6 (six) hours as needed.   Marland Kitchen albuterol (PROVENTIL HFA;VENTOLIN HFA) 108 (90 Base) MCG/ACT inhaler Inhale  2 puffs into the lungs every 6 (six) hours as needed for wheezing or shortness of breath.  Marland Kitchen apixaban (ELIQUIS) 2.5 MG TABS tablet Take 1 tablet (2.5 mg total) by mouth 2 (two) times daily.  Marland Kitchen diltiazem (CARDIZEM) 120 MG tablet Take 120 mg by mouth daily.  Marland Kitchen ipratropium-albuterol (DUONEB) 0.5-2.5 (3) MG/3ML SOLN Take 3 mLs by nebulization every 6 (six) hours as needed.  Marland Kitchen LORazepam (ATIVAN) 0.5 MG tablet Take 0.25 mg by mouth every 8 (eight) hours as needed.   . Melatonin 3 MG TABS Take 3 mg by mouth at bedtime.  . mirtazapine (REMERON) 7.5 MG tablet Take 7.5 mg by mouth at bedtime.  . Multiple  Vitamins-Minerals (CENTRUM SILVER ADULT 50+ PO) Take 1 tablet by mouth every morning. 0.4-300-200mg -mcg  . omeprazole (PRILOSEC) 20 MG capsule Take 20 mg by mouth daily. Give 30 minutes prior to meal.  . OXYGEN Inhale 2 L into the lungs daily as needed. Shortness of breath: Obtain a full set of vital signs with O2 sat, and auscultate lungs. Start O2 @ 2 l/m via nasal cannula for SOB or sat<90% on room air. Reassess O2 sat with 15 minutes. Notify provider if sat does not improve within 30 minutes.  . polyethylene glycol (MIRALAX / GLYCOLAX) packet Take 17 g by mouth daily.  . potassium chloride SA (K-DUR) 20 MEQ tablet Take 20 mEq by mouth daily.  . sennosides-docusate sodium (SENOKOT-S) 8.6-50 MG tablet Take 2 tablets by mouth at bedtime.   . torsemide (DEMADEX) 20 MG tablet Take 20 mg by mouth daily.  . [DISCONTINUED] acetaminophen (TYLENOL) 325 MG tablet Take 650 mg by mouth every 4 (four) hours as needed.  . [DISCONTINUED] divalproex (DEPAKOTE) 125 MG DR tablet Take 125 mg by mouth at bedtime.   . [DISCONTINUED] donepezil (ARICEPT) 5 MG tablet Take 5 mg by mouth at bedtime.  . [DISCONTINUED] Omega-3 Fatty Acids (FISH OIL PO) Take 1 capsule by mouth daily.   No facility-administered encounter medications on file as of 10/14/2018.     Review of Systems:  Review of Systems  Constitutional: Positive for activity change and appetite change.  HENT: Negative.   Respiratory: Negative.   Gastrointestinal: Positive for abdominal distention, abdominal pain and constipation.  Genitourinary: Negative.   Musculoskeletal: Negative.   Skin: Negative.   Neurological: Positive for weakness.  Psychiatric/Behavioral: Negative.     Health Maintenance  Topic Date Due  . INFLUENZA VACCINE  09/10/2018  . TETANUS/TDAP  08/07/2023  . DEXA SCAN  Completed  . PNA vac Low Risk Adult  Completed    Physical Exam: Vitals:   10/14/18 1603  BP: 122/66  Pulse: 98  Resp: 18  Temp: 98.2 F (36.8 C)  SpO2:  97%  Weight: 132 lb 3.2 oz (60 kg)  Height: 5' 2.5" (1.588 m)   Body mass index is 23.79 kg/m. Physical Exam Vitals signs reviewed.  HENT:     Head: Normocephalic.     Nose: Nose normal.     Mouth/Throat:     Mouth: Mucous membranes are dry.     Pharynx: Oropharynx is clear.  Eyes:     Pupils: Pupils are equal, round, and reactive to light.  Cardiovascular:     Rate and Rhythm: Normal rate and regular rhythm.     Pulses: Normal pulses.  Pulmonary:     Effort: Pulmonary effort is normal.     Breath sounds: Normal breath sounds. No wheezing.  Abdominal:     General: There is  distension.     Palpations: There is mass.     Tenderness: There is abdominal tenderness.     Comments: She has Large Mass in her abdomen with mild tenderness. No Ascites  Musculoskeletal:        General: Swelling present.  Skin:    General: Skin is warm and dry.  Neurological:     General: No focal deficit present.     Mental Status: She is alert and oriented to person, place, and time.  Psychiatric:        Mood and Affect: Mood normal.        Thought Content: Thought content normal.     Labs reviewed: Basic Metabolic Panel: Recent Labs    06/07/18 07/12/18 07/26/18  NA 146 146 144  K 3.9 3.9 3.7  BUN 16 14 15   CREATININE 0.7 0.7 0.8   Liver Function Tests: No results for input(s): AST, ALT, ALKPHOS, BILITOT, PROT, ALBUMIN in the last 8760 hours. No results for input(s): LIPASE, AMYLASE in the last 8760 hours. No results for input(s): AMMONIA in the last 8760 hours. CBC: No results for input(s): WBC, NEUTROABS, HGB, HCT, MCV, PLT in the last 8760 hours. Lipid Panel: No results for input(s): CHOL, HDL, LDLCALC, TRIG, CHOLHDL, LDLDIRECT in the last 8760 hours. No results found for: HGBA1C  Procedures since last visit: No results found.  Assessment/Plan  Retroperitoneal liposarcoma (HCC) I think her pain and distention is due to her rapidly growing liposarcoma I was unable to reach  her son and left a message for him Discussed with the in charge nurse Will start her on Roxanol 5 mg every 4 hours PRN for pain Continue on Ativan Hospice consult  CHF (congestive heart failure), NYHA class I, chronic, diastolic (HCC) At this time I would continue Demadex and potassium  PAF (paroxysmal atrial fibrillation) ([place]) Continue on Eliquis.  Eventually will discontinue  Essential hypertension Blood pressure is controlled  Dementia with behavioral disturbance,  We will discontinue Aricept and Depakote Continue supportive care Discussed with the nurse that patient probably would need higher level of care Did Review her other meds and discontinued Fish oil and Vitamins    Labs/tests ordered:  * No order type specified * Next appt:  Visit date not found Total time spent in this patient care encounter was  _45  minutes; greater than 50% of the visit spent counseling patient and staff, reviewing records , Labs and coordinating care for problems addressed at this encounter.

## 2018-10-25 ENCOUNTER — Other Ambulatory Visit: Payer: Self-pay | Admitting: *Deleted

## 2018-10-25 MED ORDER — MORPHINE SULFATE (CONCENTRATE) 20 MG/ML PO SOLN: 5.0000 mg | ORAL | 0 refills | Status: DC | PRN

## 2018-10-25 NOTE — Telephone Encounter (Signed)
Received fax from [facility] Pended Rx and sent to Dr. Gupta for approval.  

## 2018-11-02 ENCOUNTER — Encounter: Payer: Self-pay | Admitting: Nurse Practitioner

## 2018-11-02 ENCOUNTER — Non-Acute Institutional Stay: Payer: Medicare Other | Admitting: Nurse Practitioner

## 2018-11-02 DIAGNOSIS — R269 Unspecified abnormalities of gait and mobility: Secondary | ICD-10-CM

## 2018-11-02 DIAGNOSIS — W19XXXA Unspecified fall, initial encounter: Secondary | ICD-10-CM

## 2018-11-02 DIAGNOSIS — I480 Paroxysmal atrial fibrillation: Secondary | ICD-10-CM

## 2018-11-02 DIAGNOSIS — C480 Malignant neoplasm of retroperitoneum: Secondary | ICD-10-CM

## 2018-11-02 DIAGNOSIS — F0391 Unspecified dementia with behavioral disturbance: Secondary | ICD-10-CM | POA: Diagnosis not present

## 2018-11-02 NOTE — Assessment & Plan Note (Addendum)
Mechanical fall, unsteady gait and lack of safety awareness are contributory, close supervision for safety needed. Update CBC/diff, CMP/eGFR is HPOA consents.

## 2018-11-02 NOTE — Progress Notes (Signed)
Location:  [facility] Room Number: [place] [facility] of Service:  ALF [phone]) Provider:  Marlana Latus  NP  Virgie Dad, MD  Patient Care Team: Virgie Dad, MD as PCP - General (Internal Medicine) Nahser, Wonda Cheng, MD as PCP - Cardiology (Cardiology) Charish Schroepfer X, NP as Nurse Practitioner (Nurse Practitioner) Bo Merino, MD as Consulting Physician (Rheumatology) Curly Rim, MD as Consulting Physician (Internal Medicine) Ernst Spell, MD as Referring Physician (Urology) Charlotte Crumb, MD as Consulting Physician (Orthopedic Surgery) Festus Aloe, MD as Consulting Physician (Urology)  Extended Emergency Contact Information Primary Emergency Contact: [facility] Address: [address]          [place], [place] [address] [name] of [place] Phone: [phone] Mobile Phone: [phone] Relation: Son Secondary Emergency Contact: Alessandra Grout [place] of [place] Phone: [phone] Relation: Other  Code Status:  DNR Goals of care: Advanced Directive information Advanced Directives 11/02/2018  Does Patient Have a Medical Advance Directive? Yes  Type of Paramedic of [place];Out of facility DNR (pink MOST or yellow form)  Does patient want to make changes to medical advance directive? No - Patient declined  Copy of [place] in Chart? Yes - validated most recent copy scanned in chart (See row information)  Would patient like information on creating a medical advance directive? -  Pre-existing out of facility DNR order (yellow form or pink MOST form) Pink MOST/Yellow Form most recent copy in chart - Physician notified to receive inpatient order     Chief Complaint  Patient presents with  . Acute Visit    C/o - fall    HPI:  Pt is a 85 y.o. female seen today for an acute visit for fall 11/01/18, off balance when going to bathroom, no further c/o pain in buttocks.  The patient has dementia, off Depakote/Donepezil. She ambulates with walker, increased frailty as retroperitoneal liposarcoma progressing.   Hx of Afib, heart rate in control, on Diltiazem '120mg'$  qd, Eliquis 2.'5mg'$  bid  Hx of retroperitoneal liposarcoma, progressing, about basketball sized, denied pain, under Hospice service, goal of care is comfort measures, Morphine '5mg'$  q4h prn, Lorazepam 0.'25mg'$  q8h prn.   Past Medical History:  Diagnosis Date  . Abnormality of gait 12/30/2015  . Arthritis   . Asthma   . Atrial fibrillation ([place])   . DD (diverticular disease) 10/26/2013  . 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 Diverticulitis   . Edema 12/30/2015  . Gout   . Gout 12/30/2015  . History of CVA (cerebrovascular accident)    Left basal ganglia   . Hyperlipemia   . Hypertension   . Liposarcoma ([place]) 12/20/2015   excised 12/20/15  . Mitral valve regurgitation   . Mitral valve regurgitation 12/30/2015  . OP (osteoporosis) 10/26/2013  . PAF (paroxysmal atrial fibrillation) ([place]) 07/28/2013  . Pseudogout of knee 07/28/2013   03/16/13 Uric acid 5.5   . Spondylosis of cervical joint 12/30/2015  . Spondylosis of cervical spine   . TI (tricuspid incompetence) 05/18/2011   Overview:  Moderate severe   . Unspecified vitamin D deficiency 07/28/2013   03/16/13 Vit D 24.6   . Urinary incontinence without sensory awareness 07/28/2013  . Venous (peripheral) insufficiency 02/22/2014   Past Surgical History:  Procedure Laterality Date  . COLONOSCOPY  2011   Dr. Cindie Laroche Jule Ser)  . remove fatty tumor  2017  . THUMB ARTHROSCOPY Right 2014   Dr. Burney Gauze  . TONSILLECTOMY  1938  Allergies  Allergen Reactions  . Ace Inhibitors Cough    Cough  . Codeine Nausea And Vomiting  . Penicillins Hives and Rash  . Sulfa Antibiotics Hives and Rash    Outpatient Encounter Medications as of 11/02/2018  Medication Sig  . acetaminophen (TYLENOL) 500 MG tablet Take 500 mg by mouth every 6 (six) hours  as needed.   Marland Kitchen albuterol (PROVENTIL HFA;VENTOLIN HFA) 108 (90 Base) MCG/ACT inhaler Inhale 2 puffs into the lungs every 6 (six) hours as needed for wheezing or shortness of breath.  Marland Kitchen apixaban (ELIQUIS) 2.5 MG TABS tablet Take 1 tablet (2.5 mg total) by mouth 2 (two) times daily.  Marland Kitchen diltiazem (CARDIZEM) 120 MG tablet Take 120 mg by mouth daily.  Marland Kitchen ipratropium-albuterol (DUONEB) 0.5-2.5 (3) MG/3ML SOLN Take 3 mLs by nebulization every 6 (six) hours as needed.  Marland Kitchen LORazepam (ATIVAN) 0.5 MG tablet Take 0.25 mg by mouth every 8 (eight) hours as needed.   . Melatonin 3 MG TABS Take 3 mg by mouth at bedtime.  . mirtazapine (REMERON) 7.5 MG tablet Take 7.5 mg by mouth at bedtime.  Marland Kitchen morphine 20 MG/5ML solution Administer 0.54m po q6h PRN for pain and anxiety  . Multiple Vitamins-Minerals (CENTRUM SILVER ADULT 50+ PO) Take 1 tablet by mouth every morning. 0.4-300-'200mg'$ -mcg  . omeprazole (PRILOSEC) 20 MG capsule Take 20 mg by mouth daily. Give 30 minutes prior to meal.  . OXYGEN Inhale 2 L into the lungs daily as needed. Shortness of breath: Obtain a full set of vital signs with O2 sat, and auscultate lungs. Start O2 @ 2 l/m via nasal cannula for SOB or sat<90% on room air. Reassess O2 sat with 15 minutes. Notify provider if sat does not improve within 30 minutes.  . polyethylene glycol (MIRALAX / GLYCOLAX) packet Take 17 g by mouth daily.  . potassium chloride SA (K-DUR) 20 MEQ tablet Take 20 mEq by mouth daily.  . sennosides-docusate sodium (SENOKOT-S) 8.6-50 MG tablet Take 2 tablets by mouth at bedtime.   . torsemide (DEMADEX) 20 MG tablet Take 20 mg by mouth daily.  . [DISCONTINUED] morphine (ROXANOL) 20 MG/ML concentrated solution Take 0.25 mLs (5 mg total) by mouth every 4 (four) hours as needed for severe pain.   No facility-administered encounter medications on file as of 11/02/2018.    ROS was provided with assistance of staff Review of Systems  Constitutional: Positive for appetite change and  fatigue. Negative for activity change, chills, diaphoresis and fever.  HENT: Positive for hearing loss. Negative for congestion and voice change.   Respiratory: Negative for cough, shortness of breath and wheezing.   Cardiovascular: Negative for chest pain, palpitations and leg swelling.  Gastrointestinal: Positive for abdominal distention and abdominal pain. Negative for blood in stool, constipation, diarrhea and nausea.  Genitourinary: Negative for difficulty urinating, dysuria and urgency.  Musculoskeletal: Positive for gait problem.  Skin: Negative for color change and pallor.  Neurological: Negative for dizziness, speech difficulty, weakness and headaches.       Memory lapses.   Psychiatric/Behavioral: Negative for agitation, behavioral problems and hallucinations.    Immunization History  Administered Date(s) Administered  . Influenza Split 11/07/2009  . Influenza Whole 11/11/2017  . Influenza, High Dose Seasonal PF 01/10/2016, 11/18/2016  . Influenza,inj,quad, With Preservative 01/10/2016  . Influenza-Unspecified 11/09/2012, 11/27/2013, 11/08/2014, 11/21/2015  . Pneumococcal Conjugate-13 01/10/2016  . Pneumococcal Polysaccharide-23 02/09/2002  . Td 08/10/2003  . Tdap 08/06/2013  . Zoster 03/12/2005   Pertinent  Health Maintenance Due  Topic Date Due  . INFLUENZA VACCINE  09/10/2018  . DEXA SCAN  Completed  . PNA vac Low Risk Adult  Completed   Fall Risk  11/05/2017 01/07/2017 11/18/2015 06/06/2015 06/06/2015  Falls in the past year? _0   Number falls in past yr: - - - - -   Functional Status Survey:    Vitals:   11/02/18 1132  BP: (!) 142/68  Pulse: 90  Resp: (!) 22  Temp: 98.6 F (37 C)  SpO2: 95%  Weight: 132 lb 3.2 oz (60 kg)  Height: 5' 2.5" (1.588 m)   Body mass index is 23.79 kg/m. Physical Exam Vitals signs and nursing note reviewed.  Constitutional:      General: She is not in acute distress.    Appearance: Normal appearance. She is not  ill-appearing, toxic-appearing or diaphoretic.  HENT:     Head: Normocephalic and atraumatic.     Nose: Nose normal.     Mouth/Throat:     Mouth: Mucous membranes are dry.  Neck:     Musculoskeletal: Normal range of motion and neck supple.  Cardiovascular:     Rate and Rhythm: Normal rate and regular rhythm.     Heart sounds: No murmur.  Pulmonary:     Breath sounds: No wheezing, rhonchi or rales.  Abdominal:     General: There is distension.     Palpations: There is mass.     Tenderness: There is no abdominal tenderness. There is no guarding or rebound.     Comments: About basket ball sized mass, firm to touch  Musculoskeletal:     Right lower leg: No edema.     Left lower leg: No edema.  Skin:    General: Skin is warm and dry.  Neurological:     General: No focal deficit present.     Mental Status: She is alert. Mental status is at baseline.     Cranial Nerves: No cranial nerve deficit.     Motor: No weakness.     Coordination: Coordination normal.     Gait: Gait abnormal.     Comments: Oriented to person, place  Psychiatric:        Mood and Affect: Mood normal.        Behavior: Behavior normal.     Labs reviewed: Recent Labs    06/07/18 07/12/18 07/26/18  NA 146 146 144  K 3.9 3.9 3.7  BUN _1 CREATININE 0.7 0.7 0.8   No results for input(s): AST, ALT, ALKPHOS, BILITOT, PROT, ALBUMIN in the last 8760 hours. No results for input(s): WBC, NEUTROABS, HGB, HCT, MCV, PLT in the last 8760 hours. Lab Results  Component Value Date   TSH 0.99 09/09/2017   No results found for: HGBA1C Lab Results  Component Value Date   CHOL 150 02/04/2017   HDL 72 02/04/2017   LDLCALC 65 02/04/2017   TRIG 54 02/04/2017   CHOLHDL 2.1 02/04/2017    Significant Diagnostic Results in last 30 days:  No results found.  Assessment/Plan Fall Mechanical fall, unsteady gait and lack of safety awareness are contributory, close supervision for safety needed. Update CBC/diff,  CMP/eGFR is HPOA consents.   Abnormality of gait Increased frailty makes her gait more unsteady, continue ambulating with walker, close supervision and assistance if needed.   PAF (paroxysmal atrial fibrillation) (HCC) Heart rate is in control, continue Diltiazem 16m qd, Eliquis 2.553mbid.   Dementia with behavioral disturbance ([place] [facility]  for supportive care.   Retroperitoneal liposarcoma (HCC) Progressing, prn Morphine, Lorazepam available to her for comfort purpose. Continue Hospice service.      Family/ staff Communication: plan of care reviewed with the patient and charge nurse.   Labs/tests ordered: CBC/diff, CMP/eGFR  Time spend 40 minutes.

## 2018-11-02 NOTE — Assessment & Plan Note (Signed)
Heart rate is in control, continue Diltiazem 120mg qd, Eliquis 2.5mg bid.  

## 2018-11-02 NOTE — Assessment & Plan Note (Signed)
Progressing, prn Morphine, Lorazepam available to her for comfort purpose. Continue Hospice service.

## 2018-11-02 NOTE — Assessment & Plan Note (Signed)
Continue AL [facility] for supportive care.

## 2018-11-02 NOTE — Assessment & Plan Note (Signed)
Increased frailty makes her gait more unsteady, continue ambulating with walker, close supervision and assistance if needed.

## 2018-11-03 LAB — CBC AND DIFFERENTIAL
HCT: 26 — AB (ref 36–46)
Hemoglobin: 8.1 — AB (ref 12.0–16.0)
Platelets: 550 — AB (ref 150–399)
WBC: 7.4

## 2018-11-03 LAB — BASIC METABOLIC PANEL
BUN: 13 (ref 4–21)
Creatinine: 0.8 (ref 0.5–1.1)
Glucose: 90
Potassium: 4.4 (ref 3.4–5.3)
Sodium: 143 (ref 137–147)

## 2018-11-03 LAB — HEPATIC FUNCTION PANEL
ALT: 10 (ref 7–35)
AST: 16 (ref 13–35)
Alkaline Phosphatase: 274 — AB (ref 25–125)
Bilirubin, Total: 0.4

## 2018-11-08 ENCOUNTER — Encounter: Payer: Self-pay | Admitting: *Deleted

## 2018-11-08 ENCOUNTER — Encounter: Payer: Self-pay | Admitting: Internal Medicine

## 2018-11-08 ENCOUNTER — Non-Acute Institutional Stay: Payer: Medicare Other | Admitting: Internal Medicine

## 2018-11-08 DIAGNOSIS — I480 Paroxysmal atrial fibrillation: Secondary | ICD-10-CM | POA: Diagnosis not present

## 2018-11-08 DIAGNOSIS — R269 Unspecified abnormalities of gait and mobility: Secondary | ICD-10-CM | POA: Diagnosis not present

## 2018-11-08 DIAGNOSIS — W19XXXA Unspecified fall, initial encounter: Secondary | ICD-10-CM | POA: Diagnosis not present

## 2018-11-08 DIAGNOSIS — D649 Anemia, unspecified: Secondary | ICD-10-CM | POA: Diagnosis not present

## 2018-11-08 LAB — CHLORIDE
Albumin: 2.5
Calcium: 9.1
Carbon Dioxide, Total: 39
Chloride: 99
Globulin: 2.5
Total Protein: 5 g/dL

## 2018-11-08 NOTE — Progress Notes (Signed)
Location:  [facility] Room Number: [place] of Service:  ALF [phone]) Provider:  Veleta Miners  MD  Virgie Dad, MD  Patient Care Team: Virgie Dad, MD as PCP - General (Internal Medicine) Nahser, Wonda Cheng, MD as PCP - Cardiology (Cardiology) Mast, Man X, NP as Nurse Practitioner (Nurse Practitioner) Bo Merino, MD as Consulting Physician (Rheumatology) Curly Rim, MD as Consulting Physician (Internal Medicine) Ernst Spell, MD as Referring Physician (Urology) Charlotte Crumb, MD as Consulting Physician (Orthopedic Surgery) Festus Aloe, MD as Consulting Physician (Urology)  Extended Emergency Contact Information Primary Emergency Contact: [facility] Address: [address]          [place], [place] [address] [name] of [place] Phone: [phone] Mobile Phone: [phone] Relation: Son Secondary Emergency Contact: Alessandra Grout States of [place] Phone: [phone] Relation: Other  Code Status:  DNR Goals of care: Advanced Directive information Advanced Directives 11/02/2018  Does Patient Have a Medical Advance Directive? Yes  Type of Paramedic of [place];Out of facility DNR (pink MOST or yellow form)  Does patient want to make changes to medical advance directive? No - Patient declined  Copy of [place] in Chart? Yes - validated most recent copy scanned in chart (See row information)  Would patient like information on creating a medical advance directive? -  Pre-existing out of facility DNR order (yellow form or pink MOST form) Pink MOST/Yellow Form most recent copy in chart - Physician notified to receive inpatient order     Chief Complaint  Patient presents with  . Acute Visit    C/O - Fall w/ head wound    HPI:  Pt is a 85 y.o. female seen today for an acute visit for fall with head wound  She has diagnosis of PAF, Chronic Diastolic  CHF ,COPDon Chronic Oxygen,hypertension and H/o Retroperitoneal lipo patient sarcoma went through surgery in 2017 But had recurrence in 10/19 . Not surgical candidate anymore per notes [facility] her Tumor has grown significantly and is pressing on Right kidney and Liver and she is would not benefit from Surgery and will have prolong recovery. At this time she is also not candidate for palliative radiation She is now under hospice care for pain control.  Patient continues to be in AL She states that yesterday she was trying to get something out of her closet when she lost her balance and fell and hit her head. This is her second fall in the past 2 weeks.. She did sustain a head wound.  It was discussed with her son and they decided no emergency room visit for a CAT scan.  Her Eliquis was put on hold.  She was getting neurochecks. Today she is back to her baseline still getting now and doing her ADLs Denies any headache shortness of breath cough.  Sitting comfortably in her wheelchair  Past Medical History:  Diagnosis Date  . Abnormality of gait 12/30/2015  . Arthritis   . Asthma   . Atrial fibrillation ([place])   . DD (diverticular disease) 10/26/2013  . 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 Diverticulitis   . Edema 12/30/2015  . Gout   . Gout 12/30/2015  . History of CVA (cerebrovascular accident)    Left basal ganglia   . Hyperlipemia   . Hypertension   . Liposarcoma ([place]) 12/20/2015   excised 12/20/15  . Mitral valve regurgitation   . Mitral valve regurgitation 12/30/2015  .  OP (osteoporosis) 10/26/2013  . PAF (paroxysmal atrial fibrillation) ([place]) 07/28/2013  . Pseudogout of knee 07/28/2013   03/16/13 Uric acid 5.5   . Spondylosis of cervical joint 12/30/2015  . Spondylosis of cervical spine   . TI (tricuspid incompetence) 05/18/2011   Overview:  Moderate severe   . Unspecified vitamin D deficiency 07/28/2013   03/16/13 Vit D 24.6   . Urinary incontinence without sensory  awareness 07/28/2013  . Venous (peripheral) insufficiency 02/22/2014   Past Surgical History:  Procedure Laterality Date  . COLONOSCOPY  2011   Dr. Cindie Laroche Jule Ser)  . remove fatty tumor  2017  . THUMB ARTHROSCOPY Right 2014   Dr. Burney Gauze  . TONSILLECTOMY  1938    Allergies  Allergen Reactions  . Ace Inhibitors Cough    Cough  . Codeine Nausea And Vomiting  . Penicillins Hives and Rash  . Sulfa Antibiotics Hives and Rash    Outpatient Encounter Medications as of 11/08/2018  Medication Sig  . acetaminophen (TYLENOL) 500 MG tablet Take 500 mg by mouth every 6 (six) hours as needed.   Marland Kitchen albuterol (PROVENTIL HFA;VENTOLIN HFA) 108 (90 Base) MCG/ACT inhaler Inhale 2 puffs into the lungs every 6 (six) hours as needed for wheezing or shortness of breath.  Marland Kitchen apixaban (ELIQUIS) 2.5 MG TABS tablet Take 1 tablet (2.5 mg total) by mouth 2 (two) times daily.  Marland Kitchen diltiazem (CARDIZEM) 120 MG tablet Take 120 mg by mouth daily.  Marland Kitchen ipratropium-albuterol (DUONEB) 0.5-2.5 (3) MG/3ML SOLN Take 3 mLs by nebulization every 6 (six) hours as needed.  Marland Kitchen LORazepam (ATIVAN) 0.5 MG tablet Take 0.25 mg by mouth every 8 (eight) hours as needed.   . Melatonin 3 MG TABS Take 3 mg by mouth at bedtime.  . mirtazapine (REMERON) 7.5 MG tablet Take 7.5 mg by mouth at bedtime.  Marland Kitchen morphine 20 MG/5ML solution Administer 0.81ml po q6h PRN for pain and anxiety  . Multiple Vitamins-Minerals (ONE-A-DAY WOMENS 50 PLUS) TABS Take 1 tablet by mouth daily.  . Nutritional Supplements (RESOURCE 2.0) LIQD Take 120 mLs by mouth 3 (three) times daily between meals.  Marland Kitchen omeprazole (PRILOSEC) 20 MG capsule Take 20 mg by mouth daily. Give 30 minutes prior to meal.  . OXYGEN Inhale 2 L into the lungs daily as needed. Shortness of breath: Obtain a full set of vital signs with O2 sat, and auscultate lungs. Start O2 @ 2 l/m via nasal cannula for SOB or sat<90% on room air. Reassess O2 sat with 15 minutes. Notify provider if sat does  not improve within 30 minutes.  . polyethylene glycol (MIRALAX / GLYCOLAX) packet Take 17 g by mouth daily.  . potassium chloride SA (K-DUR) 20 MEQ tablet Take 20 mEq by mouth daily.  . sennosides-docusate sodium (SENOKOT-S) 8.6-50 MG tablet Take 2 tablets by mouth at bedtime.   . torsemide (DEMADEX) 20 MG tablet Take 20 mg by mouth daily.  . [DISCONTINUED] Multiple Vitamins-Minerals (CENTRUM SILVER ADULT 50+ PO) Take 1 tablet by mouth every morning. 0.4-300-200mg -mcg   No facility-administered encounter medications on file as of 11/08/2018.     Review of Systems  Constitutional: Positive for activity change.  HENT: Negative.   Respiratory: Negative.   Cardiovascular: Positive for leg swelling.  Gastrointestinal: Negative.   Genitourinary: Negative.   Musculoskeletal: Positive for gait problem.  Skin: Negative.   Neurological: Positive for weakness.  Psychiatric/Behavioral: Positive for confusion.  All other systems reviewed and are negative.   Immunization History  Administered Date(s) Administered  .  Influenza Split 11/07/2009  . Influenza Whole 11/11/2017  . Influenza, High Dose Seasonal PF 01/10/2016, 11/18/2016  . Influenza,inj,quad, With Preservative 01/10/2016  . Influenza-Unspecified 11/09/2012, 11/27/2013, 11/08/2014, 11/21/2015  . Pneumococcal Conjugate-13 01/10/2016  . Pneumococcal Polysaccharide-23 02/09/2002  . Td 08/10/2003  . Tdap 08/06/2013  . Zoster 03/12/2005   Pertinent  Health Maintenance Due  Topic Date Due  . INFLUENZA VACCINE  09/10/2018  . DEXA SCAN  Completed  . PNA vac Low Risk Adult  Completed   Fall Risk  11/05/2017 01/07/2017 11/18/2015 06/06/2015 06/06/2015  Falls in the past year? No No No No No  Number falls in past yr: - - - - -   Functional Status Survey:    Vitals:   11/08/18 0902  BP: 136/62  Pulse: 88  Resp: 20  Temp: 97.8 F (36.6 C)  SpO2: 97%  Weight: 132 lb 3.2 oz (60 kg)  Height: 5' 2.5" (1.588 m)   Body mass index is  23.79 kg/m. Physical Exam Vitals signs reviewed.  Constitutional:      Appearance: Normal appearance.  HENT:     Head: Normocephalic.     Nose: Nose normal.     Mouth/Throat:     Mouth: Mucous membranes are moist.     Pharynx: Oropharynx is clear.  Eyes:     Pupils: Pupils are equal, round, and reactive to light.  Neck:     Musculoskeletal: Neck supple.  Cardiovascular:     Rate and Rhythm: Normal rate. Rhythm irregular.     Pulses: Normal pulses.  Pulmonary:     Effort: Pulmonary effort is normal. No respiratory distress.     Breath sounds: Normal breath sounds. No wheezing or rales.  Abdominal:     General: Abdomen is flat. There is distension.     Palpations: There is mass.     Tenderness: There is abdominal tenderness.     Comments: Has Mass in the Right Abdomen coming down to Lower quadrant  Musculoskeletal: Normal range of motion.        General: Swelling present.  Skin:    General: Skin is warm.  Neurological:     General: No focal deficit present.     Mental Status: She is alert.     Comments: Has knot on her Head from the Fall  Psychiatric:        Mood and Affect: Mood normal.        Thought Content: Thought content normal.     Labs reviewed: Recent Labs    07/12/18 07/26/18 11/03/18  NA 146 144 143  K 3.9 3.7 4.4  CL  --   --  99  CO2  --   --  39  BUN 14 15 13   CREATININE 0.7 0.8 0.8  CALCIUM  --   --  9.1   Recent Labs    11/03/18  AST 16  ALT 10  ALKPHOS 274*  PROT 5.0  ALBUMIN 2.5   Recent Labs    11/03/18  WBC 7.4  HGB 8.1*  HCT 26*  PLT 550*   Lab Results  Component Value Date   TSH 0.99 09/09/2017   No results found for: HGBA1C Lab Results  Component Value Date   CHOL 150 02/04/2017   HDL 72 02/04/2017   LDLCALC 65 02/04/2017   TRIG 54 02/04/2017   CHOLHDL 2.1 02/04/2017    Significant Diagnostic Results in last 30 days:  No results found.  Assessment/Plan S/P Fall Patient is back to  her baseline I think patient  needs higher level of care.  Right now family wants him due to her to stay here in AL and continue to monitor and provide supportive care Anemia Sudden drop in Hgb from 14.5 to 8.1 Will discontinue Eliquis and start on iron. Repeat CBC  Retroperitoneal liposarcoma (HCC) Herr pain and distention is due to her rapidly growing liposarcoma On Roxanol 5 mg every 4 hours PRN for pain Continue on Ativan Hospice is involved  CHF (congestive heart failure), NYHA class I, chronic, diastolic (HCC) At this time I would continue Demadex and potassium  PAF (paroxysmal atrial fibrillation) ([place]) will discontinue Eliquis On Cardizem Essential hypertension Blood pressure is controlled COPD On Oxygen and Inhalers  Family/ staff Communication:   Labs/tests ordered:  Repeat CBC  Total time spent in this patient care encounter was  25_  minutes; greater than 50% of the visit spent counseling patient and staff, reviewing records , Labs and coordinating care for problems addressed at this encounter.

## 2018-11-14 ENCOUNTER — Encounter: Payer: Self-pay | Admitting: Nurse Practitioner

## 2018-11-15 LAB — CBC AND DIFFERENTIAL
HCT: 26 — AB (ref 36–46)
Hemoglobin: 7.9 — AB (ref 12.0–16.0)
Platelets: 509 — AB (ref 150–399)
WBC: 9.4

## 2018-11-21 ENCOUNTER — Encounter: Payer: Self-pay | Admitting: Nurse Practitioner

## 2018-11-21 ENCOUNTER — Non-Acute Institutional Stay: Payer: Medicare Other | Admitting: Nurse Practitioner

## 2018-11-21 DIAGNOSIS — K219 Gastro-esophageal reflux disease without esophagitis: Secondary | ICD-10-CM | POA: Diagnosis not present

## 2018-11-21 DIAGNOSIS — I480 Paroxysmal atrial fibrillation: Secondary | ICD-10-CM | POA: Diagnosis not present

## 2018-11-21 DIAGNOSIS — C480 Malignant neoplasm of retroperitoneum: Secondary | ICD-10-CM | POA: Diagnosis not present

## 2018-11-21 DIAGNOSIS — F418 Other specified anxiety disorders: Secondary | ICD-10-CM

## 2018-11-21 DIAGNOSIS — G4700 Insomnia, unspecified: Secondary | ICD-10-CM

## 2018-11-21 DIAGNOSIS — D500 Iron deficiency anemia secondary to blood loss (chronic): Secondary | ICD-10-CM

## 2018-11-21 DIAGNOSIS — I272 Pulmonary hypertension, unspecified: Secondary | ICD-10-CM | POA: Diagnosis not present

## 2018-11-21 DIAGNOSIS — K5900 Constipation, unspecified: Secondary | ICD-10-CM

## 2018-11-21 NOTE — Assessment & Plan Note (Signed)
Comfort measures, continue prn Morphine, Lorazepam.

## 2018-11-21 NOTE — Assessment & Plan Note (Signed)
Her mood is stable, sleeps/eats at her baseline, continue Mirtazapine 7.5mg  qd.

## 2018-11-21 NOTE — Progress Notes (Signed)
Location:   [place] Room Number: 806 Place of Service:  ALF (13) Provider:  Jetaun Colbath NP  Virgie Dad, MD  Patient Care Team: Virgie Dad, MD as PCP - General (Internal Medicine) Nahser, Wonda Cheng, MD as PCP - Cardiology (Cardiology) Mairany Bruno X, NP as Nurse Practitioner (Nurse Practitioner) Bo Merino, MD as Consulting Physician (Rheumatology) Curly Rim, MD as Consulting Physician (Internal Medicine) Ernst Spell, MD as Referring Physician (Urology) Charlotte Crumb, MD as Consulting Physician (Orthopedic Surgery) Festus Aloe, MD as Consulting Physician (Urology)  Extended Emergency Contact Information Primary Emergency Contact: [facility] Address: [address]          [place], [place] [address] [name] of [place] Phone: [phone] Mobile Phone: [phone] Relation: Son Secondary Emergency Contact: Alessandra Grout [place] of [place] Phone: [phone] Relation: Other  Code Status:  DNR Goals of care: Advanced Directive information Advanced Directives 11/21/2018  Does Patient Have a Medical Advance Directive? Yes  Type of Advance Directive Out of facility DNR (pink MOST or yellow form);Healthcare Power of Attorney  Does patient want to make changes to medical advance directive? No - Patient declined  Copy of [place] in Chart? Yes - validated most recent copy scanned in chart (See row information)  Would patient like information on creating a medical advance directive? -  Pre-existing out of facility DNR order (yellow form or pink MOST form) Pink MOST/Yellow Form most recent copy in chart - Physician notified to receive inpatient order     Chief Complaint  Patient presents with  . Medical Management of Chronic Issues  . Quality Metric Gaps    Influenza vaccine    HPI:  Pt is a 85 y.o. female seen today for medical management of chronic diseases.    The patient has  anemia, Hgb 8.1 924/20, 7.9 11/15/18, started on Fe 3x/wk,  no active bleed. Afib, heart rate is in control, on Diltiazem 120mg  qd. Prn Lorazepam 0.125mg  q8h, Morphine 5mg  q6h  prn available for comfort measures for abd liposarcoma. Her mood is stable, on Mirtazapine 7.5mg  qd. GERD, stable, on Omeprazole 20mg  qd. Constipation, stable, on Miralax qd, Senokot S II qd. CHF/chronic edema BLE, stable, on Torsemide 20mg  qd, Bun/creat 13/0.8 11/03/18   Past Medical History:  Diagnosis Date  . Abnormality of gait 12/30/2015  . Arthritis   . Asthma   . Atrial fibrillation ([place])   . DD (diverticular disease) 10/26/2013  . 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 Diverticulitis   . Edema 12/30/2015  . Gout   . Gout 12/30/2015  . History of CVA (cerebrovascular accident)    Left basal ganglia   . Hyperlipemia   . Hypertension   . Liposarcoma ([place]) 12/20/2015   excised 12/20/15  . Mitral valve regurgitation   . Mitral valve regurgitation 12/30/2015  . OP (osteoporosis) 10/26/2013  . PAF (paroxysmal atrial fibrillation) ([place]) 07/28/2013  . Pseudogout of knee 07/28/2013   03/16/13 Uric acid 5.5   . Spondylosis of cervical joint 12/30/2015  . Spondylosis of cervical spine   . TI (tricuspid incompetence) 05/18/2011   Overview:  Moderate severe   . Unspecified vitamin D deficiency 07/28/2013   03/16/13 Vit D 24.6   . Urinary incontinence without sensory awareness 07/28/2013  . Venous (peripheral) insufficiency 02/22/2014   Past Surgical History:  Procedure Laterality Date  . COLONOSCOPY  2011   Dr. Cindie Laroche Jule Ser)  . remove fatty tumor  2017  .  THUMB ARTHROSCOPY Right 2014   Dr. Burney Gauze  . TONSILLECTOMY  1938    Allergies  Allergen Reactions  . Ace Inhibitors Cough    Cough  . Codeine Nausea And Vomiting  . Penicillins Hives and Rash  . Sulfa Antibiotics Hives and Rash    Allergies as of 11/21/2018      Reactions   Ace Inhibitors Cough   Cough   Codeine Nausea And Vomiting    Penicillins Hives, Rash   Sulfa Antibiotics Hives, Rash      Medication List       Accurate as of November 21, 2018  4:22 PM. If you have any questions, ask your nurse or doctor.        STOP taking these medications   apixaban 2.5 MG Tabs tablet Commonly known as: Eliquis Stopped by: Tyren Dugar X Trudy Kory, NP     TAKE these medications   acetaminophen 500 MG tablet Commonly known as: TYLENOL Take 500 mg by mouth every 6 (six) hours as needed.   albuterol 108 (90 Base) MCG/ACT inhaler Commonly known as: VENTOLIN HFA Inhale 2 puffs into the lungs every 6 (six) hours as needed for wheezing or shortness of breath.   diltiazem 120 MG tablet Commonly known as: CARDIZEM Take 120 mg by mouth daily.   ferrous sulfate 325 (65 FE) MG tablet Take 325 mg by mouth daily with breakfast. Once A Day on Mon, Wed, Fri   ipratropium-albuterol 0.5-2.5 (3) MG/3ML Soln Commonly known as: DUONEB Take 3 mLs by nebulization every 6 (six) hours as needed.   LORazepam 0.5 MG tablet Commonly known as: ATIVAN Take 0.25 mg by mouth every 8 (eight) hours as needed.   Melatonin 3 MG Tabs Take 3 mg by mouth at bedtime.   mirtazapine 7.5 MG tablet Commonly known as: REMERON Take 7.5 mg by mouth at bedtime.   morphine 20 MG/5ML solution Administer 0.44ml po q6h PRN for pain and anxiety   omeprazole 20 MG capsule Commonly known as: PRILOSEC Take 20 mg by mouth daily. Give 30 minutes prior to meal.   One-A-Day Womens 50 Plus Tabs Take 1 tablet by mouth daily.   OXYGEN Inhale 2 L into the lungs daily as needed. Shortness of breath: Obtain a full set of vital signs with O2 sat, and auscultate lungs. Start O2 @ 2 l/m via nasal cannula for SOB or sat<90% on room air. Reassess O2 sat with 15 minutes. Notify provider if sat does not improve within 30 minutes.   polyethylene glycol 17 g packet Commonly known as: MIRALAX / GLYCOLAX Take 17 g by mouth daily.   potassium chloride SA 20 MEQ tablet Commonly  known as: KLOR-CON Take 20 mEq by mouth daily.   Resource 2.0 Liqd Take 120 mLs by mouth 3 (three) times daily between meals.   sennosides-docusate sodium 8.6-50 MG tablet Commonly known as: SENOKOT-S Take 2 tablets by mouth at bedtime.   torsemide 20 MG tablet Commonly known as: DEMADEX Take 20 mg by mouth daily.      ROS was provided with assistance of staff Review of Systems  Constitutional: Positive for activity change, appetite change and fatigue. Negative for chills, diaphoresis and fever.  HENT: Positive for hearing loss. Negative for congestion and voice change.   Respiratory: Negative for cough, shortness of breath and wheezing.   Cardiovascular: Negative for chest pain, palpitations and leg swelling.  Gastrointestinal: Positive for abdominal distention and abdominal pain. Negative for anal bleeding, blood in stool, constipation, diarrhea, nausea  and vomiting.  Genitourinary: Negative for difficulty urinating, dysuria and urgency.  Musculoskeletal: Positive for arthralgias and gait problem.  Skin: Negative for color change and pallor.  Neurological: Negative for dizziness, speech difficulty, weakness and headaches.       Memory lapses.   Psychiatric/Behavioral: Negative for agitation, behavioral problems, hallucinations and sleep disturbance. The patient is not nervous/anxious.     Immunization History  Administered Date(s) Administered  . Influenza Split 11/07/2009  . Influenza Whole 11/11/2017  . Influenza, High Dose Seasonal PF 01/10/2016, 11/18/2016  . Influenza,inj,quad, With Preservative 01/10/2016  . Influenza-Unspecified 11/09/2012, 11/27/2013, 11/08/2014, 11/21/2015  . Pneumococcal Conjugate-13 01/10/2016  . Pneumococcal Polysaccharide-23 02/09/2002  . Td 08/10/2003  . Tdap 08/06/2013  . Zoster 03/12/2005   Pertinent  Health Maintenance Due  Topic Date Due  . INFLUENZA VACCINE  09/10/2018  . DEXA SCAN  Completed  . PNA vac Low Risk Adult  Completed    Fall Risk  11/05/2017 01/07/2017 11/18/2015 06/06/2015 06/06/2015  Falls in the past year? No No No No No  Number falls in past yr: - - - - -   Functional Status Survey:    Vitals:   11/21/18 1350  BP: 128/68  Pulse: 72  Resp: 19  Temp: 97.7 F (36.5 C)  SpO2: 97%  Weight: 132 lb 3.2 oz (60 kg)  Height: 5' 2.5" (1.588 m)   Body mass index is 23.79 kg/m. Physical Exam Vitals signs and nursing note reviewed.  Constitutional:      General: She is not in acute distress.    Appearance: Normal appearance. She is not ill-appearing.  HENT:     Head: Normocephalic and atraumatic.     Nose: Nose normal.     Mouth/Throat:     Mouth: Mucous membranes are moist.  Eyes:     Conjunctiva/sclera: Conjunctivae normal.     Pupils: Pupils are equal, round, and reactive to light.  Neck:     Musculoskeletal: Normal range of motion and neck supple.  Cardiovascular:     Rate and Rhythm: Normal rate. Rhythm irregular.     Heart sounds: No murmur.  Pulmonary:     Breath sounds: Rhonchi present. No wheezing or rales.  Abdominal:     General: There is distension.     Palpations: There is mass.     Tenderness: There is abdominal tenderness. There is no right CVA tenderness, left CVA tenderness, guarding or rebound.  Musculoskeletal:     Right lower leg: No edema.     Left lower leg: No edema.  Skin:    General: Skin is warm.  Neurological:     General: No focal deficit present.     Mental Status: She is alert and oriented to person, place, and time. Mental status is at baseline.     Cranial Nerves: No cranial nerve deficit.     Motor: No weakness.     Coordination: Coordination normal.     Gait: Gait abnormal.     Comments: Oriented to person, place.   Psychiatric:        Mood and Affect: Mood normal.        Behavior: Behavior normal.        Thought Content: Thought content normal.     Labs reviewed: Recent Labs    07/12/18 07/26/18 11/03/18  NA 146 144 143  K 3.9 3.7 4.4  CL  --    --  99  CO2  --   --  39  BUN  14 15 13   CREATININE 0.7 0.8 0.8  CALCIUM  --   --  9.1   Recent Labs    11/03/18  AST 16  ALT 10  ALKPHOS 274*  PROT 5.0  ALBUMIN 2.5   Recent Labs    11/03/18 11/15/18  WBC 7.4 9.4  HGB 8.1* 7.9*  HCT 26* 26*  PLT 550* 509*   Lab Results  Component Value Date   TSH 0.99 09/09/2017   No results found for: HGBA1C Lab Results  Component Value Date   CHOL 150 02/04/2017   HDL 72 02/04/2017   LDLCALC 65 02/04/2017   TRIG 54 02/04/2017   CHOLHDL 2.1 02/04/2017    Significant Diagnostic Results in last 30 days:  No results found.  Assessment/Plan Pulmonary hypertension (HCC) Stable, continue Torsemide 20mg  qd.   PAF (paroxysmal atrial fibrillation) (HCC) Heart rate is in control, continue Diltiazem 120mg  qd.   GERD (gastroesophageal reflux disease) Stable, continue Omeprazole 20mg  qd.   Retroperitoneal liposarcoma (HCC) Comfort measures, continue prn Morphine, Lorazepam.   Insomnia Her mood is stable, sleeps/eats at her baseline, continue Mirtazapine 7.5mg  qd.   Depression with anxiety Her mood is stable.   Constipation Stable, continue MiraLax qd, Senokot S II qd.   Anemia Persisted, Hgb 7.9 11/15/18, continue Fe 3x/wk, update CBC in 2 weeks.      Family/ staff Communication: plan of care reviewed with the patient and charge nurse.   Labs/tests ordered: CBC 2 weeks.   Time spend 40 minutes.

## 2018-11-21 NOTE — Assessment & Plan Note (Signed)
Stable, continue Omeprazole 20mg qd.  

## 2018-11-21 NOTE — Assessment & Plan Note (Signed)
Persisted, Hgb 7.9 11/15/18, continue Fe 3x/wk, update CBC in 2 weeks.

## 2018-11-21 NOTE — Assessment & Plan Note (Signed)
Her mood is stable.

## 2018-11-21 NOTE — Assessment & Plan Note (Signed)
Stable, continue  MiraLax qd, Senokot S II qd.  

## 2018-11-21 NOTE — Assessment & Plan Note (Signed)
Heart rate is in control, continue Diltiazem 120mg qd 

## 2018-11-21 NOTE — Assessment & Plan Note (Signed)
Stable, continue Torsemide 20mg qd.  

## 2018-12-11 DEATH — deceased
